# Patient Record
Sex: Female | Born: 1945 | Race: White | Hispanic: No | Marital: Single | Smoking: Current every day smoker
Health system: Southern US, Community
[De-identification: ages and names within clinical notes are randomized; demographics above are authoritative.]

## PROBLEM LIST (undated history)

## (undated) DIAGNOSIS — F329 Major depressive disorder, single episode, unspecified: Secondary | ICD-10-CM

## (undated) DIAGNOSIS — G4730 Sleep apnea, unspecified: Secondary | ICD-10-CM

## (undated) DIAGNOSIS — R188 Other ascites: Secondary | ICD-10-CM

## (undated) DIAGNOSIS — D649 Anemia, unspecified: Secondary | ICD-10-CM

## (undated) DIAGNOSIS — F988 Other specified behavioral and emotional disorders with onset usually occurring in childhood and adolescence: Secondary | ICD-10-CM

## (undated) DIAGNOSIS — F32A Depression, unspecified: Secondary | ICD-10-CM

## (undated) DIAGNOSIS — K7010 Alcoholic hepatitis without ascites: Secondary | ICD-10-CM

## (undated) DIAGNOSIS — R002 Palpitations: Secondary | ICD-10-CM

## (undated) HISTORY — PX: FRACTURE SURGERY: SHX138

## (undated) HISTORY — PX: COLONOSCOPY: SHX174

## (undated) HISTORY — DX: Alcoholic hepatitis without ascites: K70.10

## (undated) HISTORY — DX: Other ascites: R18.8

## (undated) HISTORY — PX: MASTECTOMY: SHX3

## (undated) HISTORY — PX: JOINT REPLACEMENT: SHX530

---

## 1998-09-25 ENCOUNTER — Ambulatory Visit: Admission: RE | Admit: 1998-09-25 | Discharge: 1998-09-25 | Payer: Self-pay | Admitting: Psychiatry

## 1999-05-16 ENCOUNTER — Encounter: Payer: Self-pay | Admitting: Emergency Medicine

## 1999-05-16 ENCOUNTER — Emergency Department (HOSPITAL_COMMUNITY): Admission: EM | Admit: 1999-05-16 | Discharge: 1999-05-16 | Payer: Self-pay | Admitting: Emergency Medicine

## 2000-04-15 ENCOUNTER — Encounter: Payer: Self-pay | Admitting: *Deleted

## 2000-04-15 ENCOUNTER — Encounter: Admission: RE | Admit: 2000-04-15 | Discharge: 2000-04-15 | Payer: Self-pay | Admitting: *Deleted

## 2000-11-11 ENCOUNTER — Encounter: Admission: RE | Admit: 2000-11-11 | Discharge: 2000-11-11 | Payer: Self-pay | Admitting: Internal Medicine

## 2000-11-11 ENCOUNTER — Encounter: Payer: Self-pay | Admitting: Internal Medicine

## 2001-06-09 ENCOUNTER — Ambulatory Visit (HOSPITAL_COMMUNITY): Admission: RE | Admit: 2001-06-09 | Discharge: 2001-06-09 | Payer: Self-pay | Admitting: Chiropractic Medicine

## 2001-06-09 ENCOUNTER — Encounter: Payer: Self-pay | Admitting: Chiropractic Medicine

## 2002-09-26 ENCOUNTER — Encounter: Payer: Self-pay | Admitting: Internal Medicine

## 2002-09-26 ENCOUNTER — Encounter: Admission: RE | Admit: 2002-09-26 | Discharge: 2002-09-26 | Payer: Self-pay | Admitting: Internal Medicine

## 2004-07-25 ENCOUNTER — Ambulatory Visit: Payer: Self-pay | Admitting: Gastroenterology

## 2004-08-08 ENCOUNTER — Ambulatory Visit: Payer: Self-pay | Admitting: Gastroenterology

## 2006-05-07 ENCOUNTER — Encounter: Admission: RE | Admit: 2006-05-07 | Discharge: 2006-05-07 | Payer: Self-pay | Admitting: Internal Medicine

## 2006-05-21 ENCOUNTER — Encounter (INDEPENDENT_AMBULATORY_CARE_PROVIDER_SITE_OTHER): Payer: Self-pay | Admitting: *Deleted

## 2006-05-21 ENCOUNTER — Encounter (INDEPENDENT_AMBULATORY_CARE_PROVIDER_SITE_OTHER): Payer: Self-pay | Admitting: Radiology

## 2006-05-21 ENCOUNTER — Encounter: Admission: RE | Admit: 2006-05-21 | Discharge: 2006-05-21 | Payer: Self-pay | Admitting: Internal Medicine

## 2006-05-28 ENCOUNTER — Ambulatory Visit: Payer: Self-pay | Admitting: Oncology

## 2006-06-01 ENCOUNTER — Encounter: Admission: RE | Admit: 2006-06-01 | Discharge: 2006-06-01 | Payer: Self-pay | Admitting: Internal Medicine

## 2006-06-02 ENCOUNTER — Encounter: Admission: RE | Admit: 2006-06-02 | Discharge: 2006-06-02 | Payer: Self-pay | Admitting: Internal Medicine

## 2006-06-07 ENCOUNTER — Ambulatory Visit (HOSPITAL_BASED_OUTPATIENT_CLINIC_OR_DEPARTMENT_OTHER): Admission: RE | Admit: 2006-06-07 | Discharge: 2006-06-08 | Payer: Self-pay | Admitting: Surgery

## 2006-06-07 ENCOUNTER — Encounter (INDEPENDENT_AMBULATORY_CARE_PROVIDER_SITE_OTHER): Payer: Self-pay | Admitting: Specialist

## 2006-06-09 LAB — CANCER ANTIGEN 27.29: CA 27.29: 26 U/mL (ref 0–39)

## 2006-06-09 LAB — COMPREHENSIVE METABOLIC PANEL
Alkaline Phosphatase: 76 U/L (ref 39–117)
BUN: 18 mg/dL (ref 6–23)
CO2: 26 mEq/L (ref 19–32)
Creatinine, Ser: 0.68 mg/dL (ref 0.40–1.20)
Glucose, Bld: 119 mg/dL — ABNORMAL HIGH (ref 70–99)
Sodium: 142 mEq/L (ref 135–145)
Total Bilirubin: 0.4 mg/dL (ref 0.3–1.2)

## 2006-06-09 LAB — CBC WITH DIFFERENTIAL (CANCER CENTER ONLY)
BASO%: 1.3 % (ref 0.0–2.0)
LYMPH%: 40.6 % (ref 14.0–48.0)
MCV: 99 fL (ref 81–101)
MONO#: 0.6 10*3/uL (ref 0.1–0.9)
MONO%: 5.8 % (ref 0.0–13.0)
Platelets: 232 10*3/uL (ref 145–400)
RDW: 11.1 % (ref 10.5–14.6)
WBC: 11.1 10*3/uL — ABNORMAL HIGH (ref 3.9–10.0)

## 2006-06-09 LAB — LACTATE DEHYDROGENASE: LDH: 175 U/L (ref 94–250)

## 2006-06-11 ENCOUNTER — Encounter: Admission: RE | Admit: 2006-06-11 | Discharge: 2006-06-11 | Payer: Self-pay | Admitting: Oncology

## 2006-06-29 LAB — LIPID PANEL
Cholesterol: 218 mg/dL — ABNORMAL HIGH (ref 0–200)
HDL: 49 mg/dL (ref >39)
LDL Cholesterol: 142 mg/dL — ABNORMAL HIGH (ref 0–99)
Triglycerides: 136 mg/dL (ref <150)

## 2006-08-10 ENCOUNTER — Ambulatory Visit: Payer: Self-pay | Admitting: Oncology

## 2006-08-11 LAB — CBC WITH DIFFERENTIAL (CANCER CENTER ONLY)
BASO%: 0.7 % (ref 0.0–2.0)
HCT: 44.1 % (ref 34.8–46.6)
LYMPH#: 2.4 10*3/uL (ref 0.9–3.3)
MONO#: 0.5 10*3/uL (ref 0.1–0.9)
NEUT#: 3.4 10*3/uL (ref 1.5–6.5)
Platelets: 222 10*3/uL (ref 145–400)
RDW: 10.8 % (ref 10.5–14.6)
WBC: 6.5 10*3/uL (ref 3.9–10.0)

## 2006-08-11 LAB — LIPID PANEL
HDL: 41 mg/dL (ref >39)
LDL Cholesterol: 139 mg/dL — ABNORMAL HIGH (ref 0–99)
Total CHOL/HDL Ratio: 5.7 Ratio

## 2006-11-01 ENCOUNTER — Ambulatory Visit: Payer: Self-pay | Admitting: Oncology

## 2006-11-02 LAB — CBC WITH DIFFERENTIAL (CANCER CENTER ONLY)
BASO#: 0.1 10*3/uL (ref 0.0–0.2)
BASO%: 0.8 % (ref 0.0–2.0)
HCT: 41.6 % (ref 34.8–46.6)
HGB: 14.4 g/dL (ref 11.6–15.9)
LYMPH#: 2.6 10*3/uL (ref 0.9–3.3)
MONO#: 0.4 10*3/uL (ref 0.1–0.9)
NEUT%: 47.3 % (ref 39.6–80.0)
RBC: 4.33 10*6/uL (ref 3.70–5.32)
WBC: 6.1 10*3/uL (ref 3.9–10.0)

## 2006-11-02 LAB — COMPREHENSIVE METABOLIC PANEL
ALT: 52 U/L — ABNORMAL HIGH (ref 0–35)
AST: 35 U/L (ref 0–37)
Albumin: 4.6 g/dL (ref 3.5–5.2)
Alkaline Phosphatase: 87 U/L (ref 39–117)
Potassium: 4.4 mEq/L (ref 3.5–5.3)
Sodium: 137 mEq/L (ref 135–145)
Total Bilirubin: 0.4 mg/dL (ref 0.3–1.2)
Total Protein: 7.4 g/dL (ref 6.0–8.3)

## 2006-11-02 LAB — CANCER ANTIGEN 27.29: CA 27.29: 24 U/mL (ref 0–39)

## 2007-05-03 ENCOUNTER — Ambulatory Visit: Payer: Self-pay | Admitting: Oncology

## 2007-05-04 LAB — COMPREHENSIVE METABOLIC PANEL
ALT: 62 U/L — ABNORMAL HIGH (ref 0–35)
BUN: 15 mg/dL (ref 6–23)
CO2: 24 mEq/L (ref 19–32)
Calcium: 9.4 mg/dL (ref 8.4–10.5)
Chloride: 100 mEq/L (ref 96–112)
Creatinine, Ser: 0.69 mg/dL (ref 0.40–1.20)
Glucose, Bld: 167 mg/dL — ABNORMAL HIGH (ref 70–99)
Total Bilirubin: 0.6 mg/dL (ref 0.3–1.2)

## 2007-05-04 LAB — CBC WITH DIFFERENTIAL (CANCER CENTER ONLY)
BASO#: 0 10*3/uL (ref 0.0–0.2)
EOS%: 2.1 % (ref 0.0–7.0)
Eosinophils Absolute: 0.2 10*3/uL (ref 0.0–0.5)
HCT: 43 % (ref 34.8–46.6)
HGB: 14.7 g/dL (ref 11.6–15.9)
LYMPH#: 2.4 10*3/uL (ref 0.9–3.3)
MCH: 33.5 pg (ref 26.0–34.0)
MCHC: 34.3 g/dL (ref 32.0–36.0)
NEUT%: 59.4 % (ref 39.6–80.0)
RBC: 4.4 10*6/uL (ref 3.70–5.32)

## 2007-05-13 ENCOUNTER — Encounter: Admission: RE | Admit: 2007-05-13 | Discharge: 2007-05-13 | Payer: Self-pay | Admitting: Surgery

## 2007-06-23 ENCOUNTER — Other Ambulatory Visit: Admission: RE | Admit: 2007-06-23 | Discharge: 2007-06-23 | Payer: Self-pay | Admitting: Internal Medicine

## 2007-07-11 ENCOUNTER — Encounter: Admission: RE | Admit: 2007-07-11 | Discharge: 2007-07-11 | Payer: Self-pay | Admitting: Internal Medicine

## 2007-07-22 ENCOUNTER — Ambulatory Visit: Payer: Self-pay | Admitting: Oncology

## 2007-07-27 LAB — IRON AND TIBC
%SAT: 40 % (ref 20–55)
TIBC: 360 ug/dL (ref 250–470)

## 2007-07-27 LAB — CBC WITH DIFFERENTIAL (CANCER CENTER ONLY)
EOS%: 3.2 % (ref 0.0–7.0)
Eosinophils Absolute: 0.3 10*3/uL (ref 0.0–0.5)
MCH: 32.9 pg (ref 26.0–34.0)
MONO%: 5.7 % (ref 0.0–13.0)
NEUT#: 4.6 10*3/uL (ref 1.5–6.5)
Platelets: 218 10*3/uL (ref 145–400)
RBC: 4.62 10*6/uL (ref 3.70–5.32)

## 2007-07-27 LAB — CMP (CANCER CENTER ONLY)
ALT(SGPT): 89 U/L — ABNORMAL HIGH (ref 10–47)
Albumin: 3.8 g/dL (ref 3.3–5.5)
Alkaline Phosphatase: 87 U/L — ABNORMAL HIGH (ref 26–84)
Glucose, Bld: 131 mg/dL — ABNORMAL HIGH (ref 73–118)
Potassium: 3.9 mEq/L (ref 3.3–4.7)
Sodium: 144 mEq/L (ref 128–145)
Total Protein: 7.4 g/dL (ref 6.4–8.1)

## 2007-07-27 LAB — FERRITIN: Ferritin: 653 ng/mL — ABNORMAL HIGH (ref 10–291)

## 2007-07-27 LAB — RETICULOCYTES (CHCC): RBC.: 4.7 MIL/uL (ref 3.87–5.11)

## 2007-08-02 LAB — HEMOCHROMATOSIS DNA-PCR(C282Y,H63D)

## 2007-08-25 LAB — CBC WITH DIFFERENTIAL (CANCER CENTER ONLY)
Eosinophils Absolute: 0.2 10*3/uL (ref 0.0–0.5)
LYMPH%: 39.3 % (ref 14.0–48.0)
MCH: 31.7 pg (ref 26.0–34.0)
MCV: 93 fL (ref 81–101)
MONO%: 4 % (ref 0.0–13.0)
Platelets: 244 10*3/uL (ref 145–400)
RBC: 4.54 10*6/uL (ref 3.70–5.32)
RDW: 10.5 % (ref 10.5–14.6)

## 2007-08-25 LAB — CMP (CANCER CENTER ONLY)
AST: 38 U/L (ref 11–38)
Albumin: 3.4 g/dL (ref 3.3–5.5)
Alkaline Phosphatase: 95 U/L — ABNORMAL HIGH (ref 26–84)
Potassium: 3.7 mEq/L (ref 3.3–4.7)
Sodium: 143 mEq/L (ref 128–145)
Total Protein: 7.3 g/dL (ref 6.4–8.1)

## 2007-08-25 LAB — IRON AND TIBC: TIBC: 348 ug/dL (ref 250–470)

## 2007-08-25 LAB — FERRITIN: Ferritin: 325 ng/mL — ABNORMAL HIGH (ref 10–291)

## 2007-09-21 ENCOUNTER — Ambulatory Visit: Payer: Self-pay | Admitting: Oncology

## 2007-09-22 LAB — COMPREHENSIVE METABOLIC PANEL
ALT: 37 U/L — ABNORMAL HIGH (ref 0–35)
AST: 37 U/L (ref 0–37)
Albumin: 4 g/dL (ref 3.5–5.2)
Alkaline Phosphatase: 98 U/L (ref 39–117)
BUN: 10 mg/dL (ref 6–23)
Chloride: 103 mEq/L (ref 96–112)
Potassium: 3.4 mEq/L — ABNORMAL LOW (ref 3.5–5.3)

## 2007-09-22 LAB — CBC WITH DIFFERENTIAL (CANCER CENTER ONLY)
BASO#: 0 10*3/uL (ref 0.0–0.2)
EOS%: 1.8 % (ref 0.0–7.0)
MCH: 31.9 pg (ref 26.0–34.0)
MCHC: 34.1 g/dL (ref 32.0–36.0)
MONO%: 6.4 % (ref 0.0–13.0)
NEUT#: 3.3 10*3/uL (ref 1.5–6.5)
Platelets: 261 10*3/uL (ref 145–400)
RDW: 10.8 % (ref 10.5–14.6)

## 2007-09-23 LAB — IRON AND TIBC
%SAT: 25 % (ref 20–55)
TIBC: 376 ug/dL (ref 250–470)

## 2008-03-23 ENCOUNTER — Ambulatory Visit: Payer: Self-pay | Admitting: Oncology

## 2008-03-27 LAB — CBC WITH DIFFERENTIAL (CANCER CENTER ONLY)
BASO#: 0.1 10*3/uL (ref 0.0–0.2)
EOS%: 2.2 % (ref 0.0–7.0)
HCT: 48.4 % — ABNORMAL HIGH (ref 34.8–46.6)
HGB: 16.6 g/dL — ABNORMAL HIGH (ref 11.6–15.9)
LYMPH%: 38.2 % (ref 14.0–48.0)
MCH: 34.6 pg — ABNORMAL HIGH (ref 26.0–34.0)
MCHC: 34.2 g/dL (ref 32.0–36.0)
MONO%: 6.7 % (ref 0.0–13.0)
NEUT%: 51.1 % (ref 39.6–80.0)

## 2008-03-27 LAB — CMP (CANCER CENTER ONLY)
ALT(SGPT): 45 U/L (ref 10–47)
AST: 47 U/L — ABNORMAL HIGH (ref 11–38)
Alkaline Phosphatase: 97 U/L — ABNORMAL HIGH (ref 26–84)
BUN, Bld: 12 mg/dL (ref 7–22)
Calcium: 8.9 mg/dL (ref 8.0–10.3)
Creat: 0.2 mg/dl — ABNORMAL LOW (ref 0.6–1.2)
Total Bilirubin: 1 mg/dl (ref 0.20–1.60)

## 2008-03-27 LAB — IRON AND TIBC
%SAT: 42 % (ref 20–55)
TIBC: 406 ug/dL (ref 250–470)

## 2008-03-27 LAB — FERRITIN: Ferritin: 158 ng/mL (ref 10–291)

## 2008-05-17 ENCOUNTER — Encounter: Admission: RE | Admit: 2008-05-17 | Discharge: 2008-05-17 | Payer: Self-pay | Admitting: Internal Medicine

## 2008-05-25 ENCOUNTER — Encounter: Admission: RE | Admit: 2008-05-25 | Discharge: 2008-05-25 | Payer: Self-pay | Admitting: Internal Medicine

## 2008-05-25 ENCOUNTER — Encounter (INDEPENDENT_AMBULATORY_CARE_PROVIDER_SITE_OTHER): Payer: Self-pay | Admitting: Diagnostic Radiology

## 2008-06-01 ENCOUNTER — Encounter: Admission: RE | Admit: 2008-06-01 | Discharge: 2008-06-01 | Payer: Self-pay | Admitting: Internal Medicine

## 2008-06-12 ENCOUNTER — Encounter: Admission: RE | Admit: 2008-06-12 | Discharge: 2008-06-12 | Payer: Self-pay | Admitting: Oncology

## 2008-06-19 ENCOUNTER — Encounter: Admission: RE | Admit: 2008-06-19 | Discharge: 2008-06-19 | Payer: Self-pay | Admitting: Surgery

## 2008-06-21 ENCOUNTER — Ambulatory Visit (HOSPITAL_BASED_OUTPATIENT_CLINIC_OR_DEPARTMENT_OTHER): Admission: RE | Admit: 2008-06-21 | Discharge: 2008-06-22 | Payer: Self-pay | Admitting: Surgery

## 2008-06-21 ENCOUNTER — Encounter (INDEPENDENT_AMBULATORY_CARE_PROVIDER_SITE_OTHER): Payer: Self-pay | Admitting: Surgery

## 2008-09-24 ENCOUNTER — Ambulatory Visit: Payer: Self-pay | Admitting: Oncology

## 2008-09-26 LAB — CMP (CANCER CENTER ONLY)
ALT(SGPT): 112 U/L — ABNORMAL HIGH (ref 10–47)
BUN, Bld: 12 mg/dL (ref 7–22)
CO2: 28 mEq/L (ref 18–33)
Calcium: 9.5 mg/dL (ref 8.0–10.3)
Chloride: 102 mEq/L (ref 98–108)
Creat: 0.6 mg/dl (ref 0.6–1.2)
Total Bilirubin: 0.7 mg/dl (ref 0.20–1.60)

## 2008-09-26 LAB — IRON AND TIBC
%SAT: 29 % (ref 20–55)
Iron: 113 ug/dL (ref 42–145)
TIBC: 396 ug/dL (ref 250–470)
UIBC: 283 ug/dL

## 2008-09-26 LAB — CBC WITH DIFFERENTIAL (CANCER CENTER ONLY)
BASO#: 0.1 10*3/uL (ref 0.0–0.2)
HCT: 46.5 % (ref 34.8–46.6)
HGB: 16.3 g/dL — ABNORMAL HIGH (ref 11.6–15.9)
LYMPH#: 2 10*3/uL (ref 0.9–3.3)
MCHC: 35.1 g/dL (ref 32.0–36.0)
MCV: 97 fL (ref 81–101)
MONO#: 0.5 10*3/uL (ref 0.1–0.9)
NEUT%: 47.2 % (ref 39.6–80.0)
WBC: 5.3 10*3/uL (ref 3.9–10.0)

## 2008-09-26 LAB — FERRITIN: Ferritin: 554 ng/mL — ABNORMAL HIGH (ref 10–291)

## 2009-02-14 ENCOUNTER — Ambulatory Visit: Payer: Self-pay | Admitting: Internal Medicine

## 2009-04-02 ENCOUNTER — Ambulatory Visit: Payer: Self-pay | Admitting: Oncology

## 2009-04-03 LAB — IRON AND TIBC
%SAT: 34 % (ref 20–55)
Iron: 142 ug/dL (ref 42–145)

## 2009-04-03 LAB — CBC WITH DIFFERENTIAL (CANCER CENTER ONLY)
BASO%: 1.3 % (ref 0.0–2.0)
Eosinophils Absolute: 0.2 10*3/uL (ref 0.0–0.5)
HCT: 48.4 % — ABNORMAL HIGH (ref 34.8–46.6)
LYMPH#: 2.3 10*3/uL (ref 0.9–3.3)
MONO#: 0.4 10*3/uL (ref 0.1–0.9)
NEUT%: 52.8 % (ref 39.6–80.0)
RBC: 4.74 10*6/uL (ref 3.70–5.32)
RDW: 12 % (ref 10.5–14.6)
WBC: 6.3 10*3/uL (ref 3.9–10.0)

## 2009-04-03 LAB — FERRITIN: Ferritin: 508 ng/mL — ABNORMAL HIGH (ref 10–291)

## 2009-04-03 LAB — CMP (CANCER CENTER ONLY)
ALT(SGPT): 93 U/L — ABNORMAL HIGH (ref 10–47)
CO2: 29 mEq/L (ref 18–33)
Calcium: 10.2 mg/dL (ref 8.0–10.3)
Chloride: 99 mEq/L (ref 98–108)
Creat: 0.6 mg/dl (ref 0.6–1.2)
Glucose, Bld: 94 mg/dL (ref 73–118)
Sodium: 144 mEq/L (ref 128–145)
Total Bilirubin: 0.9 mg/dl (ref 0.20–1.60)
Total Protein: 7.5 g/dL (ref 6.4–8.1)

## 2009-05-04 ENCOUNTER — Emergency Department (HOSPITAL_COMMUNITY): Admission: EM | Admit: 2009-05-04 | Discharge: 2009-05-04 | Payer: Self-pay | Admitting: Emergency Medicine

## 2009-06-21 ENCOUNTER — Encounter (INDEPENDENT_AMBULATORY_CARE_PROVIDER_SITE_OTHER): Payer: Self-pay | Admitting: *Deleted

## 2009-07-17 IMAGING — CR DG CHEST 2V
2 series · 2 of 2 positions shown · non-contrast
Comparison: [HOSPITAL] at [REDACTED] [HOSPITAL] chest x-ray
06/02/2006.

CLINICAL DATA: Preop respiratory exam.  Breast cancer.  Smoker.
Hypertension.

CHEST - 2 VIEW

[view not recorded (1 of 2)]
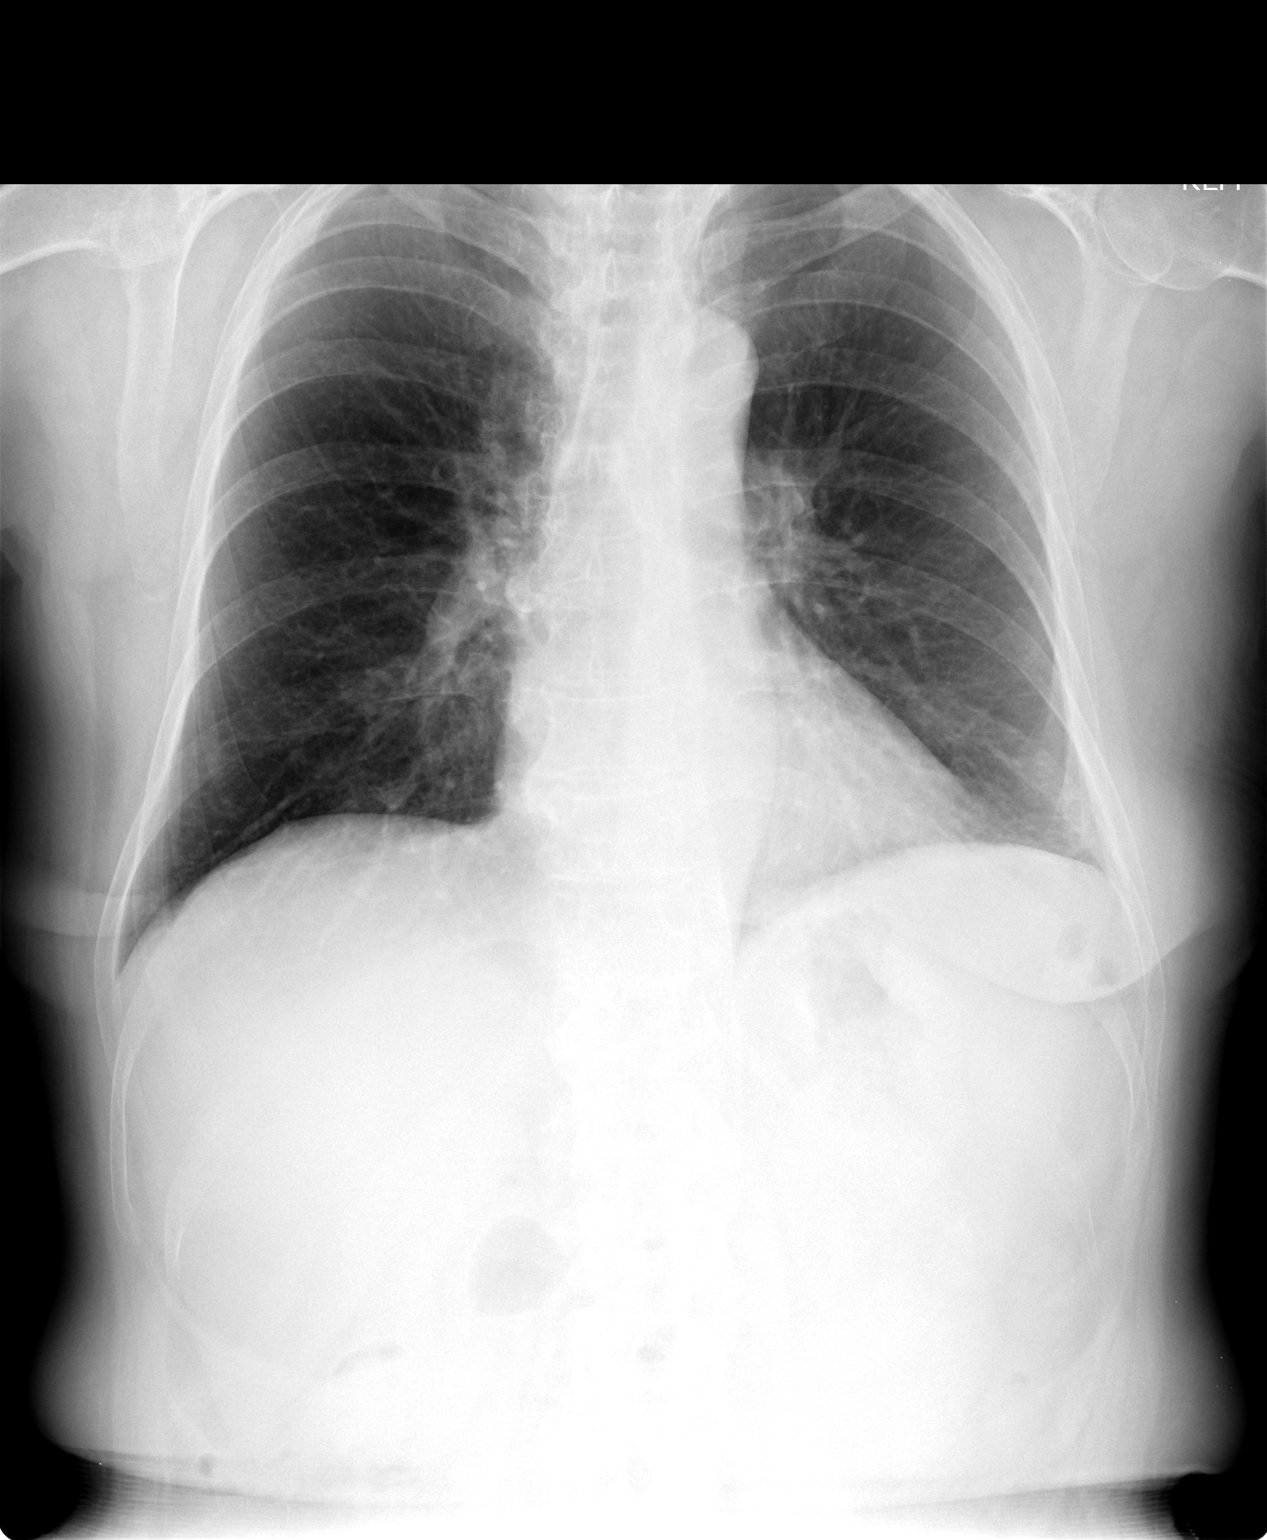

[view not recorded (2 of 2)]
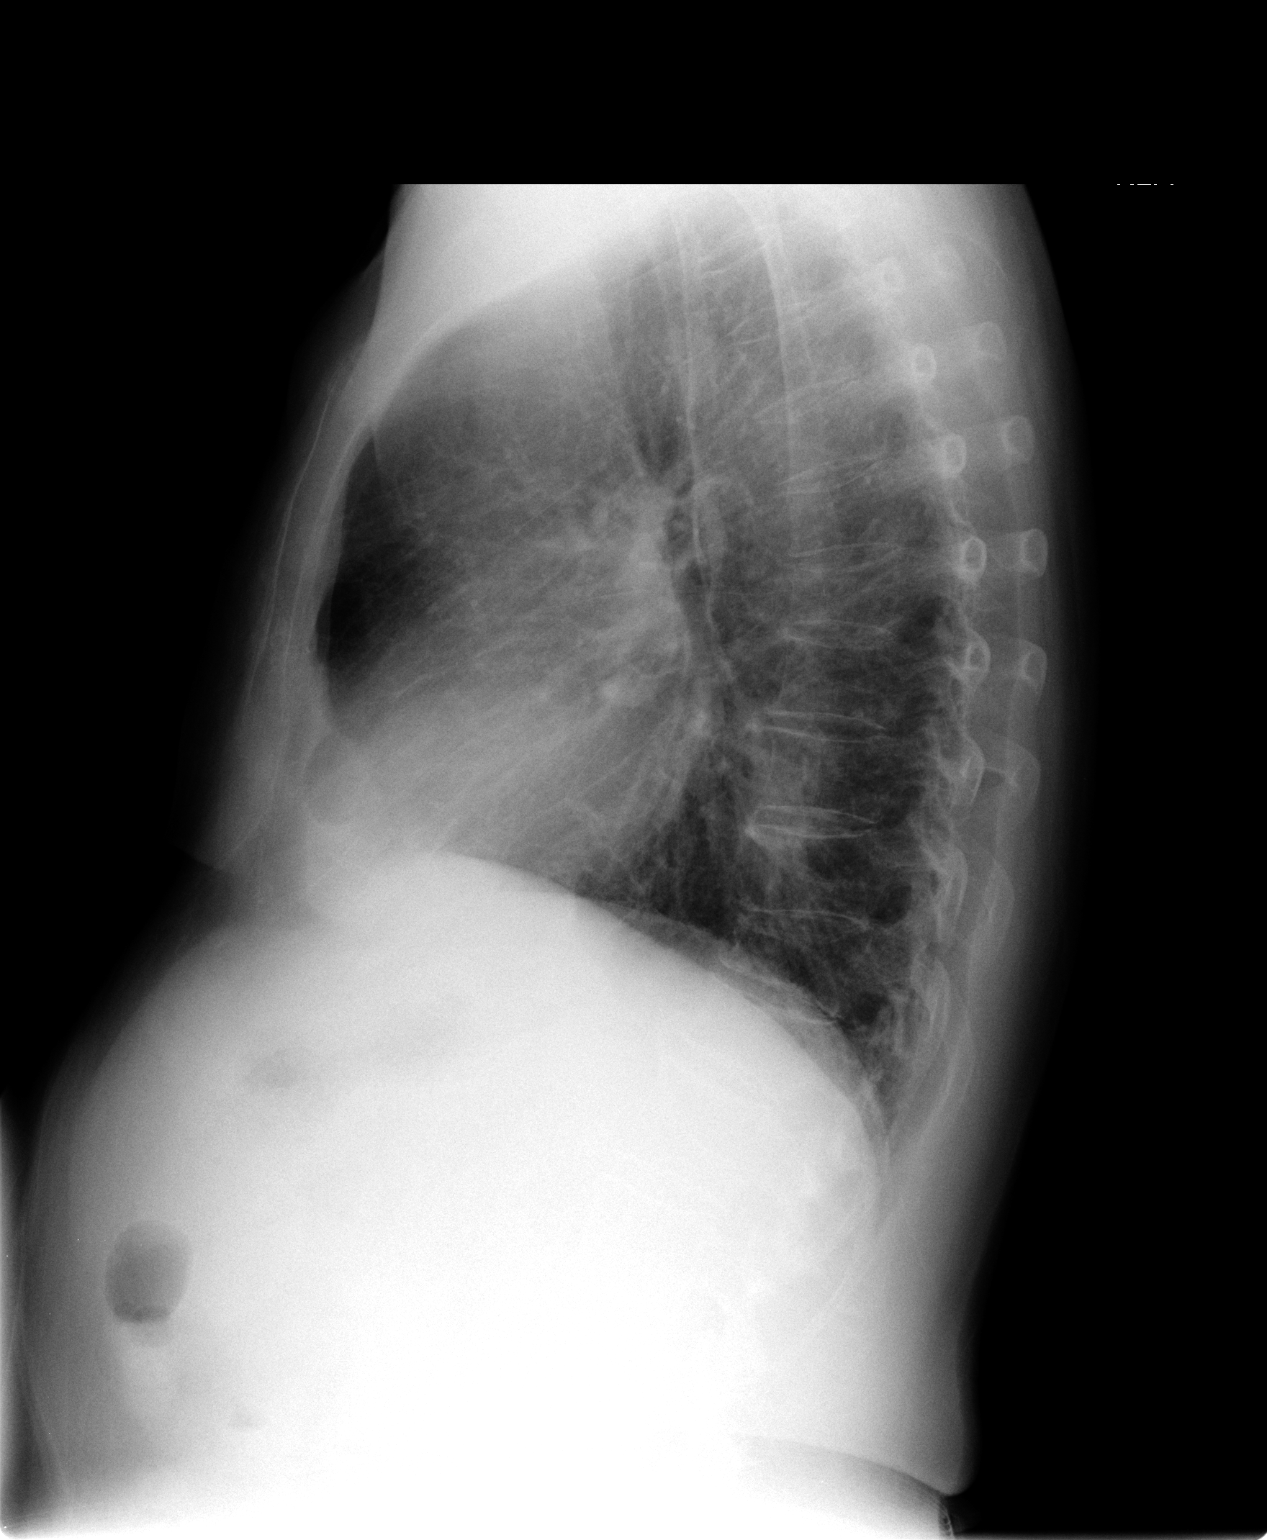

[2 of 2 positions shown; findings below may reference images not displayed]

FINDINGS: Interval right mastectomy noted.  Lungs are clear.  Heart
size/configuration remain normal.  Thoracic aortic arch
calcification unchanged.  Mediastinum, hila, pleura appear normal.
Dorsal osteopenia with stable slight compression fracture inferior
thoracic spine noted.  Unchanged dorsal scoliosis and slight
degenerative changes.
IMPRESSION: 1.  Interval right mastectomy.
2.  No acute or metastatic disease.
3.  Stable dorsal osteopenia and compression fracture inferior
dorsal spine.

REF:G3 DICTATED: 06/19/2008 [DATE]

## 2009-08-23 ENCOUNTER — Encounter (INDEPENDENT_AMBULATORY_CARE_PROVIDER_SITE_OTHER): Payer: Self-pay | Admitting: *Deleted

## 2009-08-28 ENCOUNTER — Ambulatory Visit: Payer: Self-pay | Admitting: Gastroenterology

## 2009-09-11 ENCOUNTER — Ambulatory Visit: Payer: Self-pay | Admitting: Gastroenterology

## 2009-09-23 ENCOUNTER — Ambulatory Visit: Payer: Self-pay | Admitting: Oncology

## 2009-09-24 ENCOUNTER — Ambulatory Visit: Payer: Self-pay | Admitting: Internal Medicine

## 2009-11-25 ENCOUNTER — Other Ambulatory Visit: Admission: RE | Admit: 2009-11-25 | Discharge: 2009-11-25 | Payer: Self-pay | Admitting: Internal Medicine

## 2009-11-25 ENCOUNTER — Ambulatory Visit: Payer: Self-pay | Admitting: Internal Medicine

## 2009-11-27 ENCOUNTER — Ambulatory Visit: Payer: Self-pay | Admitting: Oncology

## 2009-12-03 LAB — CBC WITH DIFFERENTIAL (CANCER CENTER ONLY)
BASO#: 0.1 10*3/uL (ref 0.0–0.2)
EOS%: 2.9 % (ref 0.0–7.0)
HCT: 46.6 % (ref 34.8–46.6)
HGB: 15.9 g/dL (ref 11.6–15.9)
LYMPH#: 1.8 10*3/uL (ref 0.9–3.3)
MCH: 35.6 pg — ABNORMAL HIGH (ref 26.0–34.0)
MCHC: 34.2 g/dL (ref 32.0–36.0)
NEUT%: 65.3 % (ref 39.6–80.0)

## 2009-12-03 LAB — CMP (CANCER CENTER ONLY)
AST: 111 U/L — ABNORMAL HIGH (ref 11–38)
BUN, Bld: 12 mg/dL (ref 7–22)
Calcium: 9.8 mg/dL (ref 8.0–10.3)
Chloride: 100 mEq/L (ref 98–108)
Creat: 0.6 mg/dl (ref 0.6–1.2)
Glucose, Bld: 127 mg/dL — ABNORMAL HIGH (ref 73–118)

## 2009-12-04 LAB — FOLATE: Folate: 7.3 ng/mL

## 2009-12-04 LAB — IRON AND TIBC
%SAT: 24 % (ref 20–55)
Iron: 96 ug/dL (ref 42–145)
TIBC: 398 ug/dL (ref 250–470)
UIBC: 302 ug/dL

## 2010-03-03 ENCOUNTER — Inpatient Hospital Stay (HOSPITAL_COMMUNITY): Admission: RE | Admit: 2010-03-03 | Discharge: 2010-03-05 | Payer: Self-pay | Admitting: Orthopedic Surgery

## 2010-06-17 NOTE — Letter (Signed)
Summary: [facility] Instructions  [facility]  [address] [place], [place] [address]   Phone: [phone]  Fax: [phone]       Teresa Schneider    1945/09/06    MRN: [id number]        Procedure Day Dorna Bloom:  [facility]  09/11/09     Arrival Time:  9:30AM     Procedure Time:  10:30AM     Location of Procedure:                    _ X_  [facility] (4th Floor)                        PREPARATION FOR COLONOSCOPY WITH MOVIPREP   Starting 5 days prior to your procedure 09/06/09 do not eat nuts, seeds, popcorn, corn, beans, peas,  salads, or any raw vegetables.  Do not take any fiber supplements (e.g. Metamucil, Citrucel, and Benefiber).  THE DAY BEFORE YOUR PROCEDURE         DATE: 09/10/09  DAY: TUESDAY  1.  Drink clear liquids the entire day-NO SOLID FOOD  2.  Do not drink anything colored red or purple.  Avoid juices with pulp.  No orange juice.  3.  Drink at least 64 oz. (8 glasses) of fluid/clear liquids during the day to prevent dehydration and help the prep work efficiently.  CLEAR LIQUIDS INCLUDE: Water Jello Ice Popsicles Tea (sugar ok, no milk/cream) Powdered fruit flavored drinks Coffee (sugar ok, no milk/cream) Gatorade Juice: apple, white grape, white cranberry  Lemonade Clear bullion, consomm, broth Carbonated beverages (any kind) Strained chicken noodle soup Hard Candy                             4.  In the morning, mix first dose of MoviPrep solution:    Empty 1 Pouch A and 1 Pouch B into the disposable container    Add lukewarm drinking water to the top line of the container. Mix to dissolve    Refrigerate (mixed solution should be used within 24 hrs)  5.  Begin drinking the prep at 5:00 p.m. The MoviPrep container is divided by 4 marks.   Every 15 minutes drink the solution down to the next mark (approximately 8 oz) until the full liter is complete.   6.  Follow completed prep with 16 oz of clear liquid of your choice  (Nothing red or purple).  Continue to drink clear liquids until bedtime.  7.  Before going to bed, mix second dose of MoviPrep solution:    Empty 1 Pouch A and 1 Pouch B into the disposable container    Add lukewarm drinking water to the top line of the container. Mix to dissolve    Refrigerate  THE DAY OF YOUR PROCEDURE      DATE: 09/11/09  DAY: WEDNESDAY  Beginning at 5:30AM (5 hours before procedure):         1. Every 15 minutes, drink the solution down to the next mark (approx 8 oz) until the full liter is complete.  2. Follow completed prep with 16 oz. of clear liquid of your choice.    3. You may drink clear liquids until 8:30AM (2 HOURS BEFORE PROCEDURE).   MEDICATION INSTRUCTIONS  Unless otherwise instructed, you should take regular prescription medications with a small sip of water   as early as possible the morning  of your procedure.          OTHER INSTRUCTIONS  You will need a responsible adult at least 63 years of age to accompany you and drive you home.   This person must remain in the waiting room during your procedure.  Wear loose fitting clothing that is easily removed.  Leave jewelry and other valuables at home.  However, you may wish to bring a book to read or  an iPod/MP3 player to listen to music as you wait for your procedure to start.  Remove all body piercing jewelry and leave at home.  Total time from sign-in until discharge is approximately 2-3 hours.  You should go home directly after your procedure and rest.  You can resume normal activities the  day after your procedure.  The day of your procedure you should not:   Drive   Make legal decisions   Operate machinery   Drink alcohol   Return to work  You will receive specific instructions about eating, activities and medications before you leave.    The above instructions have been reviewed and explained to me by  Wyona Almas RN  August 28, 2009 9:15 AM     I fully  understand and can verbalize these instructions _____________________________ Date _________

## 2010-06-17 NOTE — Letter (Signed)
Summary: Colonoscopy Letter  [facility]  [address] [place], [place] [address]   Phone: [phone]  Fax: [phone]      June 21, 2009 MRN: [id number]   [facility] [address] [place], [place]  [address]   Dear Ms. Spraker,   According to your medical record, it is time for you to schedule a Colonoscopy. The American Cancer Society recommends this procedure as a method to detect early colon cancer. Patients with a family history of colon cancer, or a personal history of colon polyps or inflammatory bowel disease are at increased risk.  This letter has beeen generated based on the recommendations made at the time of your procedure. If you feel that in your particular situation this may no longer apply, please contact our office.  Please call our office at [phone] to schedule this appointment or to update your records at your earliest convenience.  Thank you for cooperating with [place] to provide you with the very best care possible.   Sincerely,  Vania Rea. Jarold Motto, M.D.  [facility]astro[facility] [phone]

## 2010-06-17 NOTE — Procedures (Signed)
Summary: Colonoscopy  Patient: [name] Note: All result statuses are Final unless otherwise noted.  Tests: (1) Colonoscopy (COL)   COL Colonoscopy           DONE     [facility]     [address]     [place], [place]  [address]           COLONOSCOPY PROCEDURE REPORT           PATIENT:  Teresa Schneider, Teresa Schneider  MR#:  [id number]     BIRTHDATE:  20-May-1945, 64 yrs. old  GENDER:  female     ENDOSCOPIST:  Vania Rea. Jarold Motto, MD, [facility]     REF. BY:     PROCEDURE DATE:  09/11/2009     PROCEDURE:  Higher-risk screening colonoscopy G0105           ASA CLASS:  Class II     INDICATIONS:  Elevated Risk Screening, history of polyps     MEDICATIONS:   Fentanyl 75 mcg IV, Versed 8 mg IV           DESCRIPTION OF PROCEDURE:   After the risks benefits and     alternatives of the procedure were thoroughly explained, informed     consent was obtained.  Digital rectal exam was performed and     revealed no abnormalities, perianal skin tags and perianal     dermatitis.   The LB PCF-H180AL [id number] endoscope was introduced     through the anus and advanced to the cecum, which was identified     by both the appendix and ileocecal valve, limited by a redundant     colon.    The quality of the prep was excellent, using MoviPrep.     The instrument was then slowly withdrawn as the colon was fully     examined.     <<PROCEDUREIMAGES>>           FINDINGS:  Mild diverticulosis was found in the sigmoid to     descending colon segments.  No polyps or cancers were seen.  This     was otherwise a normal examination of the colon.   Retroflexed     views in the rectum revealed no abnormalities.    The scope was     then withdrawn from the patient and the procedure completed.           COMPLICATIONS:  None     ENDOSCOPIC IMPRESSION:     1) Mild diverticulosis in the sigmoid to descending colon     segments     2) No polyps or cancers     3) Otherwise normal examination     RECOMMENDATIONS:  1) high fiber diet     2) Continue current colorectal screening recommendations for     "routine risk" patients with a repeat colonoscopy in 10 years.     REPEAT EXAM:  No           ______________________________     Vania Rea. Jarold Motto, MD, [facility]           CC:           n.     eSIGNED:   Vania Rea. Osborn Pullin at 09/11/2009 11:04 AM           Raylene Miyamoto, [id number]  Note: An exclamation mark (!) indicates a result that was not dispersed into the flowsheet. Document Creation Date: 09/11/2009 11:05 AM _______________________________________________________________________  (1) Order result status: Final  Collection or observation date-time: 09/11/2009 11:01 Requested date-time:  Receipt date-time:  Reported date-time:  Referring Physician:   Ordering Physician: Sheryn Bison [phone]) Specimen Source:  Source: Launa Grill Order Number: [id number] Lab site:   Appended Document: Colonoscopy    Clinical Lists Changes  Observations: Added new observation of COLONNXTDUE: 08/2019 (09/11/2009 11:18)      Appended Document: Colonoscopy TO DR. MARY JOHN BAXLEY.....?? ENT EXAM PER HER HOARSENESS AND SMOKING HX...COLON REPORT TO FOLLOW.Marland KitchenMarland KitchenNO POLYPS NOTED...DRP

## 2010-06-17 NOTE — Miscellaneous (Signed)
Summary: [facility] Previsit/prep  Clinical Lists Changes  Medications: Added new medication of MOVIPREP 100 GM  SOLR (PEG-KCL-NACL-NASULF-NA ASC-C) As per prep instructions. - Signed Rx of MOVIPREP 100 GM  SOLR (PEG-KCL-NACL-NASULF-NA ASC-C) As per prep instructions.;  #1 x 0;  Signed;  Entered by: Wyona Almas RN;  Authorized by: Mardella Layman MD [facility];  Method used: Electronically to [facility] [address] [place], [place]  [id number], Ph: [phone] or [phone], Fax: [phone] Allergies: Added new allergy or adverse reaction of SEPTRA Observations: Added new observation of NKA: F (08/28/2009 8:46)    Prescriptions: MOVIPREP 100 GM  SOLR (PEG-KCL-NACL-NASULF-NA ASC-C) As per prep instructions.  #1 x 0   Entered by:   Wyona Almas RN   Authorized by:   Mardella Layman MD [facility]   Signed by:   Wyona Almas RN on 08/28/2009   Method used:   Electronically to        [facility] (retail)       [address]       [place], [place]  [id number]       Ph: [phone] or [phone]       Fax: [phone]   RxID:   [id number]

## 2010-06-26 ENCOUNTER — Ambulatory Visit (INDEPENDENT_AMBULATORY_CARE_PROVIDER_SITE_OTHER): Payer: Self-pay | Admitting: Internal Medicine

## 2010-06-26 DIAGNOSIS — N390 Urinary tract infection, site not specified: Secondary | ICD-10-CM

## 2010-07-30 LAB — BASIC METABOLIC PANEL
CO2: 29 mEq/L (ref 19–32)
Chloride: 103 mEq/L (ref 96–112)
GFR calc non Af Amer: >60 mL/min (ref >60)
Glucose, Bld: 108 mg/dL — ABNORMAL HIGH (ref 70–99)
Glucose, Bld: 225 mg/dL — ABNORMAL HIGH (ref 70–99)
Potassium: 4.1 mEq/L (ref 3.5–5.1)
Potassium: 4.2 mEq/L (ref 3.5–5.1)
Sodium: 136 mEq/L (ref 135–145)
Sodium: 140 mEq/L (ref 135–145)

## 2010-07-30 LAB — CBC
HCT: 31.1 % — ABNORMAL LOW (ref 36.0–46.0)
HCT: 36.7 % (ref 36.0–46.0)
Hemoglobin: 10.7 g/dL — ABNORMAL LOW (ref 12.0–15.0)
Hemoglobin: 12.7 g/dL (ref 12.0–15.0)
MCH: 34.9 pg — ABNORMAL HIGH (ref 26.0–34.0)
MCHC: 34.5 g/dL (ref 30.0–36.0)
MCHC: 34.6 g/dL (ref 30.0–36.0)
WBC: 13.4 10*3/uL — ABNORMAL HIGH (ref 4.0–10.5)

## 2010-07-30 LAB — ABO/RH: ABO/RH(D): O POS

## 2010-07-31 LAB — APTT: aPTT: 26 seconds (ref 24–37)

## 2010-07-31 LAB — URINALYSIS, ROUTINE W REFLEX MICROSCOPIC
Nitrite: NEGATIVE
Protein, ur: NEGATIVE mg/dL
Specific Gravity, Urine: 1.023 (ref 1.005–1.030)
Urobilinogen, UA: 1 mg/dL (ref 0.0–1.0)

## 2010-07-31 LAB — PROTIME-INR: INR: 0.96 (ref 0.00–1.49)

## 2010-07-31 LAB — CBC
MCH: 34.6 pg — ABNORMAL HIGH (ref 26.0–34.0)
MCV: 101 fL — ABNORMAL HIGH (ref 78.0–100.0)
Platelets: 191 10*3/uL (ref 150–400)
RDW: 14 % (ref 11.5–15.5)

## 2010-07-31 LAB — COMPREHENSIVE METABOLIC PANEL
Albumin: 3.6 g/dL (ref 3.5–5.2)
BUN: 7 mg/dL (ref 6–23)
Creatinine, Ser: 0.53 mg/dL (ref 0.4–1.2)
Total Bilirubin: 1.1 mg/dL (ref 0.3–1.2)
Total Protein: 6.7 g/dL (ref 6.0–8.3)

## 2010-07-31 LAB — URINE MICROSCOPIC-ADD ON

## 2010-07-31 LAB — SURGICAL PCR SCREEN: Staphylococcus aureus: NEGATIVE

## 2010-09-02 LAB — CBC
Hemoglobin: 14.4 g/dL (ref 12.0–15.0)
MCHC: 34 g/dL (ref 30.0–36.0)
RBC: 4.3 MIL/uL (ref 3.87–5.11)
WBC: 7.6 10*3/uL (ref 4.0–10.5)

## 2010-09-02 LAB — URINALYSIS, ROUTINE W REFLEX MICROSCOPIC
Ketones, ur: NEGATIVE mg/dL
Protein, ur: NEGATIVE mg/dL
Urobilinogen, UA: 0.2 mg/dL (ref 0.0–1.0)

## 2010-09-02 LAB — COMPREHENSIVE METABOLIC PANEL
ALT: 63 U/L — ABNORMAL HIGH (ref 0–35)
Alkaline Phosphatase: 99 U/L (ref 39–117)
CO2: 24 mEq/L (ref 19–32)
Chloride: 106 mEq/L (ref 96–112)
GFR calc non Af Amer: >60 mL/min (ref >60)
Glucose, Bld: 107 mg/dL — ABNORMAL HIGH (ref 70–99)
Potassium: 3.6 mEq/L (ref 3.5–5.1)
Sodium: 141 mEq/L (ref 135–145)
Total Bilirubin: 1.2 mg/dL (ref 0.3–1.2)

## 2010-09-02 LAB — DIFFERENTIAL
Basophils Relative: 0 % (ref 0–1)
Eosinophils Absolute: 0.1 10*3/uL (ref 0.0–0.7)
Eosinophils Relative: 2 % (ref 0–5)
Neutrophils Relative %: 58 % (ref 43–77)

## 2010-09-02 LAB — URINE MICROSCOPIC-ADD ON

## 2010-09-30 NOTE — Op Note (Signed)
NAMEMORNINGSTAR, TOFT            ACCOUNT NO.:  [id number]   MEDICAL RECORD NO.:  [id number]          PATIENT TYPE:  AMB   LOCATION:  DSC                          FACILITY:  [facility]   PHYSICIAN:  Currie Paris, M.D.DATE OF BIRTH:  02/14/1946   DATE OF PROCEDURE:  06/21/2008  DATE OF DISCHARGE:                               OPERATIVE REPORT   PREOPERATIVE DIAGNOSIS:  Ductal carcinoma in situ, left breast.   POSTOPERATIVE DIAGNOSIS:  Ductal carcinoma in situ, left breast.   OPERATION:  Left total mastectomy with blue dye injection and axillary  sentinel lymph node biopsy.   SURGEON:  Currie Paris, MD   ANESTHESIA:  General.   CLINICAL HISTORY:  This is a 65 year old lady who has had a prior right  mastectomy for breast cancer who has now presented with DCIS in the left  breast.  She has had previous implant in the left breast which had been  removed.  After discussion of alternatives with the patient, she elected  to proceed to a mastectomy.  We plan to do a sentinel node biopsy in  case invasive disease was found on permanent.   DESCRIPTION OF PROCEDURE:  The patient was seen in the holding area, and  she had no further questions.  We went ahead and marked the left breast  as the operative side, although she was status post right mastectomy.  Her radioisotope had already been injected.  She had no further  questions.   The patient was taken to the operating room and after satisfactory  general anesthesia had been obtained, the nipple-areolar area was  prepped and the time-out done.  I injected 5 mL of dilute methylene blue  and massaged that in.   Entire breast was then prepped and draped.  I marked the inframammary  fold, latissimus, and approximate area of the breast and then made an  elliptical incision to include the old biopsy site.  I raised skin flaps  in the usual fashion medially to the sternum and superiorly to the  clavicle and then out into the  axilla.  Using the Neoprobe, I was able  to do some gentle dissection and found a bright blue lymph node almost  immediately.  This was removed and had counts of about 380.   As soon as this was removed, the counts dropped down to background of 0-  5 and there was no other hot areas, no palpably abnormal nodes, and no  evidence of any blue lymphatics.   At this point, I went ahead and made the inferior flap going to  inframammary fold and laterally to the latissimus.  The breast was then  removed from medial to lateral using coagulation current of the cautery.  In the lower outer quadrant area, there was a lot of scarring and I  think there had been some disruption of the pectoralis muscle from her  prior implant and implant removal.  However, all this came off intact.  As we were entering the area of the clavipectoral fascia, Dr. Delila Spence  reported that the sentinel node was negative.   I therefore completed  the mastectomy by dividing the remaining  attachment towards latissimus and off the serratus and then into the  axilla, staying out of the axilla proper.  Bleeders were coagulated or  tied with 3-0 Vicryl suture ligatures.   I spent several minutes irrigating and making sure everything was dry.  I then placed a 19 Blake drain and I laid so that it went towards the  axilla and then up onto the anterior chest wall.   A final check was then made for hemostasis and again everything remained  dry.  The skin was closed with staples.  The drain was charged.  Sterile  dressings were applied.  The patient tolerated the procedure well.  There were no operative complications.  All counts were correct.      Currie Paris, M.D.  Electronically Signed     CJS/MEDQ  D:  06/21/2008  T:  06/21/2008  Job:  [id number]   cc:   Luanna Cole. Lenord Fellers, M.D.  Drue Second, MD

## 2010-10-03 NOTE — Op Note (Signed)
NAMECRYSTALMARIE, YASIN            ACCOUNT NO.:  [id number]   MEDICAL RECORD NO.:  [id number]          PATIENT TYPE:  AMB   LOCATION:  DSC                          FACILITY:  [facility]   PHYSICIAN:  Currie Paris, M.D.DATE OF BIRTH:  08-30-1945   DATE OF PROCEDURE:  06/07/2006  DATE OF DISCHARGE:                               OPERATIVE REPORT   OFFICE MEDICAL RECORD NUMBER [id number].   PREOPERATIVE DIAGNOSIS:  Ductal carcinoma in situ, right breast.   POSTOPERATIVE DIAGNOSIS:  Ductal carcinoma in situ, right breast, plus  silicone implant rupture.   OPERATION:  Right total mastectomy with blue-dye injection, sentinel  lymph node biopsy (1 node removed), removal of silicone implant).   SURGEON:  Dr. Jamey Ripa.   ANESTHESIA:  General.   CLINICAL HISTORY:  Teresa Schneider is a 61-year lady recently found to have  DCIS.  There was a fairly large area of involvement measuring 5 x 5 cm.  This all appeared to be DCIS, and that was what was seen on the core  biopsy.  MRI showed no other evidence of disease.   DESCRIPTION OF PROCEDURE:  The patient was seen in the holding area and  had no further questions.  We confirmed that the right breast was the  operative side.  She already been injected with a radioisotope.  I  initialed the right side.  We discussed we were planning to try to leave  her implant in and do a mastectomy superficial to that.   DESCRIPTION OF PROCEDURE:  The patient was taken to the operating room.  After satisfactory general (LMA) anesthesia had been obtained, I prepped  the area around the nipple, and a time-out occurred.  I injected 5 mL of  dilute methylene blue circumareolarly and massaged that in.   The entire right breast was then prepped and draped.   I outlined an elliptical incision to make.  I used a Neoprobe and  identified a hot area in the axilla and marked the overlying skin.   I made the incision and then raised the skin flaps superiorly to the  clavicle,  medially to the sternum and laterally out into the axilla.  When I got to the axilla, I used a Neoprobe and was able to follow a  blue lymphatic and identify a single hot blue node which had counts of  about 550.  This was removed.  There were no other hot nodes, no other  blue nodes and no palpably abnormal nodes.  This was sent as the  sentinel node.   The inferior flap was raised, and the implant was protruding out under  the pectoralis and really splitting the inferior fibers.  We got down to  the inframammary fold laterally out into the edge of the latissimus.   I began starting medially to take the breast off of the pectoralis and  started superiorly and worked from superior medial to inferior and a  little bit laterally.  However, just with traction on the breast and the  implant, I noticed a leakage of silicone medially where there was no  overlying muscle over the implant,  and there was really no significant  capsular formation around the implant.  The outer layer of the implant  appeared to be fused with the muscle and subcu.  With that finding, I  thought we ought to go ahead and take out the entire implant.  I was  able to elevate the pectoralis muscle off of the implant, but again,  there was really no plane of dissection, and we entered the implant  several more times just with this gentle traction.  This appeared to be  a single wall implant, and it was quite fused to the tissues.  Once I  got above it,  I was able to sweep down from superior off of the chest  wall and off of the rib cage.  There was a several centimeters square  segment that was fused to the ribs medially that I left behind initially  until I got the entire implant out.  Because there had been a fair  amount of spillage of silicone, we spent several minutes drying this up  using dry laps and wiping it off and trying to irrigate it and make sure  everything was as dry and silicone free as possible.  I  changed gloves  several times during this as the gloves kept getting silicone on them.   Once that it was all done, I went back and using cautery elevated the  small fragment of what appeared to be the posterior wall of the implant  off of the ribs.  Again, there was really no plane of separation; this  had to be done all with cautery, but I got this all out intact.   I irrigated and spent several minutes making sure everything was  completely dry.  Because this had protruded through the very thin  fascial edge of the pectoralis major and the pectoralis major had  retracted up and once I had everything cleaned out using all new gloves,  I took 2-0 Vicryl and reattached the lower inner portion of the  pectoralis major muscle towards the area the sternum and then the  inferior edge posteriorly inferiorly down towards the inframammary fold  and then out laterally.  This went very nicely, and I thought we had  good reconstruction of where the pectoralis major should be on top of  the minor.   We irrigated again.  I put two 19 Blake drains in, one on top of the  pectoralis major, one under it and on top of the pectoralis minor.  The  incision was closed with some 2-0 Vicryl subcu and then some staples on  the skin.  The patient tolerated the procedure well.  There were no  complications other than the implant rupture.  Estimated blood loss was  100 mL.  All counts were correct.  At the end of closing, I placed 30 mL  of 0.25% Marcaine subcutaneously in the flaps, let it sit for few  minutes prior to charging the JPs.      Currie Paris, M.D.  Electronically Signed     CJS/MEDQ  D:  06/07/2006  T:  06/07/2006  Job:  [id number]   cc:   Luanna Cole. Lenord Fellers, M.D.

## 2011-08-24 ENCOUNTER — Telehealth: Payer: Self-pay | Admitting: Gastroenterology

## 2011-08-25 NOTE — Telephone Encounter (Addendum)
lmom for pt to call back. COLON 09/11/09 showing mild diverticulosis only.

## 2011-08-27 ENCOUNTER — Other Ambulatory Visit: Payer: Self-pay | Admitting: Gastroenterology

## 2011-08-27 ENCOUNTER — Encounter: Payer: Self-pay | Admitting: *Deleted

## 2011-08-27 DIAGNOSIS — R634 Abnormal weight loss: Secondary | ICD-10-CM

## 2011-08-27 DIAGNOSIS — R110 Nausea: Secondary | ICD-10-CM

## 2011-08-27 NOTE — Progress Notes (Signed)
Patient transferred to Dr Matthias Hughs, records release received

## 2011-08-28 ENCOUNTER — Ambulatory Visit
Admission: RE | Admit: 2011-08-28 | Discharge: 2011-08-28 | Disposition: A | Discharge status: Home / Self Care | Payer: PRIVATE HEALTH INSURANCE | Source: Ambulatory Visit | Attending: Gastroenterology | Admitting: Gastroenterology

## 2011-08-28 DIAGNOSIS — R634 Abnormal weight loss: Secondary | ICD-10-CM

## 2011-08-28 DIAGNOSIS — R110 Nausea: Secondary | ICD-10-CM

## 2011-08-31 ENCOUNTER — Other Ambulatory Visit: Payer: Self-pay | Admitting: Gastroenterology

## 2011-09-17 ENCOUNTER — Other Ambulatory Visit: Payer: Self-pay

## 2011-09-17 ENCOUNTER — Telehealth: Payer: Self-pay | Admitting: Internal Medicine

## 2011-09-17 MED ORDER — VARENICLINE TARTRATE 0.5 MG PO TABS: 0.5000 mg | ORAL_TABLET | Freq: Two times a day (BID) | ORAL | Status: AC

## 2011-09-17 NOTE — Telephone Encounter (Signed)
Call in Chantix starter pack which will last 30 days- then pt needs to be seen for additional RX

## 2011-10-23 ENCOUNTER — Encounter: Payer: Self-pay | Admitting: Internal Medicine

## 2011-10-23 ENCOUNTER — Ambulatory Visit: Payer: PRIVATE HEALTH INSURANCE | Admitting: Internal Medicine

## 2012-03-28 ENCOUNTER — Ambulatory Visit (INDEPENDENT_AMBULATORY_CARE_PROVIDER_SITE_OTHER): Payer: PRIVATE HEALTH INSURANCE | Admitting: Internal Medicine

## 2012-03-28 ENCOUNTER — Encounter: Payer: Self-pay | Admitting: Internal Medicine

## 2012-03-28 VITALS — BP 112/56 | HR 84 | Temp 99.3°F | Wt 116.0 lb

## 2012-03-28 DIAGNOSIS — K709 Alcoholic liver disease, unspecified: Secondary | ICD-10-CM | POA: Insufficient documentation

## 2012-03-28 DIAGNOSIS — Z853 Personal history of malignant neoplasm of breast: Secondary | ICD-10-CM | POA: Insufficient documentation

## 2012-03-28 DIAGNOSIS — F329 Major depressive disorder, single episode, unspecified: Secondary | ICD-10-CM

## 2012-03-28 DIAGNOSIS — Z87891 Personal history of nicotine dependence: Secondary | ICD-10-CM | POA: Insufficient documentation

## 2012-03-28 DIAGNOSIS — F988 Other specified behavioral and emotional disorders with onset usually occurring in childhood and adolescence: Secondary | ICD-10-CM | POA: Insufficient documentation

## 2012-03-28 DIAGNOSIS — R188 Other ascites: Secondary | ICD-10-CM

## 2012-03-28 LAB — CBC WITH DIFFERENTIAL/PLATELET
Basophils Relative: 1 % (ref 0–1)
Eosinophils Absolute: 0.1 10*3/uL (ref 0.0–0.7)
Eosinophils Relative: 1 % (ref 0–5)
Hemoglobin: 11.3 g/dL — ABNORMAL LOW (ref 12.0–15.0)
Lymphs Abs: 1.9 10*3/uL (ref 0.7–4.0)
MCH: 34.6 pg — ABNORMAL HIGH (ref 26.0–34.0)
MCHC: 35.4 g/dL (ref 30.0–36.0)
MCV: 97.6 fL (ref 78.0–100.0)
Monocytes Absolute: 0.5 10*3/uL (ref 0.1–1.0)
Monocytes Relative: 6 % (ref 3–12)
Neutrophils Relative %: 70 % (ref 43–77)

## 2012-03-28 NOTE — Patient Instructions (Addendum)
Await results of lab studies. We will try to get appointment with Dr. Matthias Hughs later this week.

## 2012-03-28 NOTE — Progress Notes (Signed)
  Subjective:    Patient ID: Teresa Schneider, female    DOB: [birth year], 66 y.o.   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 Ulyses Jarred he for alcoholic hepatitis and alcoholic liver disease. This was diagnosed in the spring of 2013. She subsequently quit drinking after 31 day stay at [facility] and subsequently resumed drinking in the summer. Dr. Ulyses Jarred he saw her in September. She had no evidence of ascites at that time. He saw her again in mid October. She continued to drink heavily. Patient states that she did not go to Alcoholics Anonymous after her rehabilitation stay. She had no ascites in October. White blood cell count is 14,000, hemoglobin 11.9 g, platelet count 139,000. Electrolytes were within normal limits. She had an albumin of 3.2, total bilirubin of 8.7, alkaline phosphatase 345, AST 107, ALT 32. Increased white blood cell count was thought to be due to liver inflammation. Patient since has stopped drinking and has been off alcohol since that time. No withdrawal symptoms apparently. Accompanied today by Dr. Delton Prairie. Patient lives in a garage apartment at Dr. Essie Hart. Patient admits to have been depressed and that was why she was drinking. Has been depressed since her mother died which has been a few years ago. She had a sister died by suicide. Patient is seen here infrequently. History of left breast carcinoma diagnosed in 2010. She had a prior right mastectomy in 2008 for ductal carcinoma in situ with reconstruction surgery. History of hemochromatosis mutation. Was followed by Dr. Park Breed in the past for oncology. I don't think she has been seen by Dr. Park Breed recently.  Patient has had significant weight loss. In 2011 she weighed 123 pounds. Today she weighs 116 pounds and with Dr. Ulyses Jarred he saw her a month ago she weighed 107 pounds.  Father died at age 21 of cancer. Mother died of dementia. One brother with history of congestive heart failure.  No children. She has a Event organiser. Has smoked for over 30 years.  History of breast implants 1984.  Dr. Jarold Motto did colonoscopy 09/11/2009. History of diverticulosis.  History of osteopenia with T score -2.3 in the left femur and -1.7 and LS-spine in 2010. At one point patient took [place]. Last Pap smear on file 11/25/2009.  In July 2011, SGOT was 80 and SGPT was 62. Total bilirubin was 1.0 and alkaline phosphatase was 112. B12 and folate levels were normal despite an elevated MCV of 106.9. Hemoglobin at that time was 15.1 g.  Today patient is complaining of abdominal distention and some minor discomfort in her abdomen. Says she has been eating much better recently.    Review of Systems     Objective:   Physical Exam skin is jaundiced. Mild scleral icterus. Pharynx is clear. Neck is supple. Chest clear to auscultation. Cardiac exam regular rate and rhythm. Abdomen is distended but not tense. Very minor tenderness without rebound. Extremities without edema. Patient is rambling quite a bit today but that is not unusual for her since she has attention deficit disorder. She is alert and appears to be oriented.        Assessment & Plan:  Alcoholic liver disease with ascites  Depression  Attention deficit disorder  History of smoking  History of breast cancer bilateral  Plan: CBC and C-met drawn. Have asked Dr. Ulyses Jarred to see her later this week. Have recommended that she get involved with Alcoholics Anonymous.

## 2012-03-29 LAB — COMPREHENSIVE METABOLIC PANEL
AST: 94 U/L — ABNORMAL HIGH (ref 0–37)
Albumin: 3.1 g/dL — ABNORMAL LOW (ref 3.5–5.2)
Alkaline Phosphatase: 216 U/L — ABNORMAL HIGH (ref 39–117)
BUN: 9 mg/dL (ref 6–23)
Creat: 0.44 mg/dL — ABNORMAL LOW (ref 0.50–1.10)
Glucose, Bld: 96 mg/dL (ref 70–99)
Potassium: 3.4 mEq/L — ABNORMAL LOW (ref 3.5–5.3)

## 2012-03-30 ENCOUNTER — Other Ambulatory Visit (HOSPITAL_COMMUNITY): Payer: Self-pay | Admitting: Gastroenterology

## 2012-03-30 DIAGNOSIS — R188 Other ascites: Secondary | ICD-10-CM

## 2012-04-01 ENCOUNTER — Ambulatory Visit (HOSPITAL_COMMUNITY)
Admission: RE | Admit: 2012-04-01 | Discharge: 2012-04-01 | Disposition: A | Discharge status: Home / Self Care | Payer: PRIVATE HEALTH INSURANCE | Source: Ambulatory Visit | Attending: Gastroenterology | Admitting: Gastroenterology

## 2012-04-01 DIAGNOSIS — R161 Splenomegaly, not elsewhere classified: Secondary | ICD-10-CM | POA: Insufficient documentation

## 2012-04-01 DIAGNOSIS — J90 Pleural effusion, not elsewhere classified: Secondary | ICD-10-CM | POA: Insufficient documentation

## 2012-04-01 DIAGNOSIS — R188 Other ascites: Secondary | ICD-10-CM

## 2012-04-01 DIAGNOSIS — K7689 Other specified diseases of liver: Secondary | ICD-10-CM | POA: Insufficient documentation

## 2012-04-04 ENCOUNTER — Encounter: Payer: PRIVATE HEALTH INSURANCE | Attending: Gastroenterology | Admitting: Dietician

## 2012-04-04 ENCOUNTER — Encounter: Payer: Self-pay | Admitting: Dietician

## 2012-04-04 VITALS — Ht 62.13 in | Wt 112.8 lb

## 2012-04-04 DIAGNOSIS — K709 Alcoholic liver disease, unspecified: Secondary | ICD-10-CM | POA: Insufficient documentation

## 2012-04-04 DIAGNOSIS — R188 Other ascites: Secondary | ICD-10-CM | POA: Insufficient documentation

## 2012-04-04 DIAGNOSIS — Z713 Dietary counseling and surveillance: Secondary | ICD-10-CM | POA: Insufficient documentation

## 2012-04-04 NOTE — Progress Notes (Signed)
Assessment:  Pt is bright and receptive with recent wt loss of approximately 6 lbs. Pt notes large difference in abdominal distention.  Pt primary concern is ascites control secondary to alcoholic hepatitis. 2 g Na diet has been advised by MD. Pt also presents with recent labs of Hgb 11.9, MCV 108.3.   Abstinent from alcohol since Oct. 16th. Pt reports no food intake prior to Oct. 16th. Now able to eat small amounts of food (1-2 oz.) in a given sitting. Pt attempts to eat every 1-2 hours, preferably with protein.  Pt report attempts to control sodium.   Recall:(all meals 1-2 hours apart) 1- PB (1-2 tsp) 2- cereal with milk and sugar 3- pineapple 4- cinnamon bun 5- PF chang's beef (requested low Na version) 6- PB 7- bananas 8-chicken thigh (no skin, baked) + veg  Pt states up to 10 eating episodes of small portions (usually 1-2 oz.) Pt states large amounts of liquid intake (water and limeade, diet coke)  Nutrition Dx: Food and nutrition-related knowledge deficit related to sodium control as evidenced by significant ascites, pt statements about lack of knowledge of Na content, Na influence on ascites management. Inadeqaute kcal, protein, folate/B12 intake related to alcohol abuse as evidenced by pt statements of no food intake prior to alcohol abstinence, pt complaints of weakness, and macrocytic anemia (low Hgb, high MCV).   Intervention: RD discussed with pt importance of food intake, as tolerated, at regular intervals. RD instructed pt to eat 6-9 oz. lean meat per day, or 4 sittings at current average intake. RD instructed pt to engage in regular physical activity to improve strength, appetite.  Per MD instructions, RD provided counseling on Na restriction, with advisement of 1200 mg Na daily based on current food intake. Primary concerns for Na restriction are processed (pre-cooked canned and frozen foods), and restaurant foods. Pt expressed understanding of high Na foods. RD instructed  pt to avoid very fatty foods based on symptoms (if n/v/d present, discontinue fatty food intake). RD also instructed pt to limit simple sugar intake in liquid form (limeade, juices, sodas), and focus on water or water flavored with fruit as primary beverages.   M/E: Goals:  Ms. Tabares will continue to eat at regular intervals, as much as tolerated.  She will focus on adequate protein intake by eating high protein foods at least 4 eating episodes a day. She will focus on gradually eating larger portions to gain lean tissue and strength. She will be physically active for a combined total of at least 30 minutes per day to promote appetite, reduced nausea, and increase strength. She will do light, weight-bearing activities as tolerated, such as walking, gardening, and chair exercises such as shoulder raises and leg raises. She will limit sodium to approximately 1200 mg per day by avoiding processed foods.  She will reduce fatty food intake as symptoms (nausea, diarrhea) dictate. She will continue fluid intake of mostly water or lightly flavored water.   Follow-up in 4 weeks, after next appointment with MD.

## 2012-04-04 NOTE — Patient Instructions (Addendum)
Goals:  Teresa Schneider will continue to eat at regular intervals, as much as tolerated.  She will focus on adequate protein intake by eating high protein foods at least 4 eating episodes a day. She will focus on gradually eating larger portions to gain lean tissue and strength. She will be physically active for a combined total of at least 30 minutes per day to promote appetite, reduced nausea, and increase strength. She will do light, weight-bearing activities as tolerated, such as walking, gardening, and chair exercises such as shoulder raises and leg raises. She will limit sodium to approximately 1200 mg per day by avoiding processed foods.  She will reduce fatty food intake as symptoms (nausea, diarrhea) dictate. She will continue fluid intake of mostly water or lightly flavored water.

## 2012-04-29 ENCOUNTER — Encounter: Payer: Self-pay | Admitting: Dietician

## 2012-04-29 ENCOUNTER — Encounter: Payer: PRIVATE HEALTH INSURANCE | Attending: Gastroenterology | Admitting: Dietician

## 2012-04-29 VITALS — Ht 62.17 in | Wt 112.7 lb

## 2012-04-29 DIAGNOSIS — Z713 Dietary counseling and surveillance: Secondary | ICD-10-CM | POA: Insufficient documentation

## 2012-04-29 DIAGNOSIS — K709 Alcoholic liver disease, unspecified: Secondary | ICD-10-CM

## 2012-04-29 DIAGNOSIS — R188 Other ascites: Secondary | ICD-10-CM | POA: Insufficient documentation

## 2012-04-29 NOTE — Progress Notes (Signed)
A: Pt states eating better, tolerating more food. Has been able to increase activity- walking to end of street and back, some leg stretching. Some exercises from prior hip rehab. Pt states less discomfort with squatting, easier ability to move up and down from seated position.   Pt states no diarrhea, no steatorrhea. Pt claims no discoloration or darkly colored urine. Pt has complained of bad cramping after a large, salty meal at [facility].  Pt has clearly complied well with essentially all diet and activity modifications. Pt does complain that water is "boring."  Pt states able to increase activity, continue current dietary modifications.  Pt is concerned with cramping in abdomen, but has been advised by MD that cramping will reduce with proper diet and activity modifications, abstinence from alcohol.   Pt has managed to maintain weight, which is encouraging to prevent loss of muscle tissue, and pt statements of improved strength and endurance follow this. Pt claims she feels "200% better".  Dx: See prior.  I: Continue diet modifications of multiple small meals throughout day, with approximately 6-9 oz. Proteins. Reduce simple sugar intake by limiting sweet beverage consumption. Pt may try herbal teas, crystal light as options outside water.   Pt will increase PA as tolerated to 10 minute sessions of walking or rehab exercises spread throughout the day, to total 30 minutes per day on most days.  Pt was educated on high sodium content of most restaurant foods. Pt instructed to avoid high food intake at any one sitting at this time to prevent discomfort, high sodium intake.  M/E: See pt in approximately 1 month, after next visit with MD.  Pt will increase PA, continue current dietary modifications.

## 2012-05-26 ENCOUNTER — Ambulatory Visit (INDEPENDENT_AMBULATORY_CARE_PROVIDER_SITE_OTHER): Payer: Medicare Other | Admitting: Internal Medicine

## 2012-05-26 ENCOUNTER — Encounter: Payer: Self-pay | Admitting: Internal Medicine

## 2012-05-26 VITALS — BP 110/54 | HR 76 | Temp 98.6°F | Wt 108.0 lb

## 2012-05-26 DIAGNOSIS — J40 Bronchitis, not specified as acute or chronic: Secondary | ICD-10-CM

## 2012-05-26 MED ORDER — CEFTRIAXONE SODIUM 1 G IJ SOLR
1.0000 g | Freq: Once | INTRAMUSCULAR | Status: AC
  Administered 2012-05-26: 1 g via INTRAMUSCULAR

## 2012-05-26 NOTE — Patient Instructions (Addendum)
Take Augmentin 500 mg with food 3 times daily for 10 days. Tessalon Perles 100 mg 1 by mouth 3 times daily as needed for cough. Call if not better next week or sooner if worse

## 2012-06-03 ENCOUNTER — Ambulatory Visit: Payer: PRIVATE HEALTH INSURANCE | Admitting: Dietician

## 2012-06-09 ENCOUNTER — Encounter (HOSPITAL_COMMUNITY): Payer: Self-pay | Admitting: *Deleted

## 2012-06-09 ENCOUNTER — Emergency Department (INDEPENDENT_AMBULATORY_CARE_PROVIDER_SITE_OTHER)
Admission: EM | Admit: 2012-06-09 | Discharge: 2012-06-09 | Disposition: A | Discharge status: Home / Self Care | Payer: Medicare Other | Source: Home / Self Care

## 2012-06-09 DIAGNOSIS — L509 Urticaria, unspecified: Secondary | ICD-10-CM

## 2012-06-09 MED ORDER — DIPHENHYDRAMINE HCL 50 MG/ML IJ SOLN
INTRAMUSCULAR | Status: AC
  Filled 2012-06-09: qty 1

## 2012-06-09 MED ORDER — DIPHENHYDRAMINE HCL 50 MG/ML IJ SOLN
25.0000 mg | Freq: Once | INTRAMUSCULAR | Status: AC
  Administered 2012-06-09: 25 mg via INTRAMUSCULAR

## 2012-06-09 MED ORDER — METHYLPREDNISOLONE SODIUM SUCC 125 MG IJ SOLR
125.0000 mg | Freq: Once | INTRAMUSCULAR | Status: AC
  Administered 2012-06-09: 125 mg via INTRAMUSCULAR

## 2012-06-09 MED ORDER — METHYLPREDNISOLONE SODIUM SUCC 125 MG IJ SOLR
INTRAMUSCULAR | Status: AC
  Filled 2012-06-09: qty 2

## 2012-06-09 MED ORDER — PREDNISONE 10 MG PO TABS: ORAL_TABLET | ORAL | Status: DC

## 2012-06-09 NOTE — ED Provider Notes (Signed)
History     CSN: [id number]  Arrival date & time 06/09/12  1009   None     Chief Complaint  Patient presents with  . Rash    (Consider location/radiation/quality/duration/timing/severity/associated sxs/prior treatment) Patient is a 67 y.o. female presenting with rash. The history is provided by the patient.  Rash  This is a new problem. The current episode started 2 days ago. The problem has been gradually worsening. The problem is associated with nothing. There has been no fever. The rash is present on the neck, abdomen, back and torso. The pain is moderate. The pain has been constant since onset. Associated symptoms include itching. She has tried nothing for the symptoms. The treatment provided no relief.  Pt complains of an itchy rash.  No new chemicals, no new foods,  Past Medical History  Diagnosis Date  . Alcoholic hepatitis   . Ascites     Past Surgical History  Procedure Date  . Joint replacement   . Mastectomy     History reviewed. No pertinent family history.  History  Substance Use Topics  . Smoking status: Current Every Day Smoker -- 1.0 packs/day for 35 years    Types: Cigarettes  . Smokeless tobacco: Never Used  . Alcohol Use: Yes    OB History    Grav Para Term Preterm Abortions TAB SAB Ect Mult Living                  Review of Systems  Skin: Positive for itching and rash.  All other systems reviewed and are negative.    Allergies  Salmon; Other; and Sulfamethoxazole w-trimethoprim  Home Medications   Current Outpatient Rx  Name  Route  Sig  Dispense  Refill  . DIPHENHYDRAMINE HCL 25 MG PO CAPS   Oral   Take 25 mg by mouth at bedtime as needed.         Marland Kitchen LOPERAMIDE HCL 2 MG PO TABS   Oral   Take 2 mg by mouth as needed.           BP 119/66  Pulse 68  Temp 97.8 F (36.6 C) (Oral)  Resp 16  SpO2 97%  Physical Exam  Vitals reviewed. Constitutional: She appears well-developed and well-nourished.  HENT:  Head:  Normocephalic.  Eyes: Conjunctivae normal are normal. Pupils are equal, round, and reactive to light.  Neck: Normal range of motion.  Cardiovascular: Normal rate.   Pulmonary/Chest: Effort normal.  Musculoskeletal: Normal range of motion.  Neurological: She is alert.  Skin: Rash noted. There is erythema.       Hives and multiple scratch marks    ED Course  Procedures (including critical care time)  Labs Reviewed - No data to display No results found.   No diagnosis found.    MDM  Pt given solumedrol and benadryl IM.   Pt given 6 day steroid taper,  Pt advised benadryl every 4 hours.         Lonia Skinner [place], [place] 06/09/12 1048

## 2012-06-09 NOTE — ED Notes (Signed)
Pt reports rash since Tuesday all over body that has spread and is itching - no relief from cortisone cream or benadryl po q 4 hrs.

## 2012-06-09 NOTE — ED Provider Notes (Signed)
The patient was told that the differential diagnosis includes heart disease, and that it was of the utmost importance to followup with the specialist to whom she was referred.   Reuben Likes, MD 06/09/12 [phone]

## 2012-06-18 NOTE — Progress Notes (Signed)
  Subjective:    Patient ID: Teresa Schneider, female    DOB: 19-Apr-1946, 67 y.o.   MRN: [id number]  HPI 67 year old white female with URI symptoms. Has had cough and congestion. History of cirrhosis of the liver. Has been doing better recently but still has ascites. Says she is not drinking. Continues to be followed by Dr. Matthias Hughs. No fever or shaking chills. History of smoking.    Review of Systems     Objective:   Physical Exam skin is warm and dry. She is alert and oriented x3. Affect is appropriate. Chest clear.dictation without rales or wheezing. TMs and pharynx are clear. Neck is supple without significant adenopathy or JVD. Ascites is present. Abdomen is distended.        Assessment & Plan:  Bronchitis  History of smoking  Cirrhosis of the liver  Plan: Augmentin 500 mg by mouth 3 times a day for 10 days. Tessalon Perles 100 mg (#60) 2 by mouth 3 times a day when necessary cough

## 2012-09-05 ENCOUNTER — Ambulatory Visit: Payer: Medicare Other | Admitting: Internal Medicine

## 2012-12-21 ENCOUNTER — Other Ambulatory Visit: Payer: Self-pay

## 2013-03-10 ENCOUNTER — Ambulatory Visit
Admission: RE | Admit: 2013-03-10 | Discharge: 2013-03-10 | Disposition: A | Discharge status: Home / Self Care | Payer: Medicare Other | Source: Ambulatory Visit | Attending: Internal Medicine | Admitting: Internal Medicine

## 2013-03-10 ENCOUNTER — Ambulatory Visit (INDEPENDENT_AMBULATORY_CARE_PROVIDER_SITE_OTHER): Payer: Medicare Other | Admitting: Internal Medicine

## 2013-03-10 ENCOUNTER — Encounter: Payer: Self-pay | Admitting: Internal Medicine

## 2013-03-10 ENCOUNTER — Telehealth: Payer: Self-pay | Admitting: Internal Medicine

## 2013-03-10 VITALS — BP 138/70 | HR 92 | Temp 96.6°F | Ht 63.0 in | Wt 119.0 lb

## 2013-03-10 DIAGNOSIS — R0781 Pleurodynia: Secondary | ICD-10-CM

## 2013-03-10 DIAGNOSIS — R079 Chest pain, unspecified: Secondary | ICD-10-CM

## 2013-03-10 NOTE — Telephone Encounter (Signed)
Patient verbalized understanding of all orders given.

## 2013-03-10 NOTE — Progress Notes (Signed)
  Subjective:    Patient ID: Teresa Schneider, female    DOB: [birth year], 67 y.o.   MRN: [id number]  HPI patient has history of alcoholic cirrhosis of the liver and prior history of breast cancer. Has developed some left posterior lateral rib cage pain. Noticed it when [place] the beach [place]. It seemed to improve for short time and now has recurred. She saw gastroenterologist recently regarding liver disease and was told it was not related to liver disease. Says pain has a burning quality and is intense when she coughs, sneezes, or yawns. No fall or trauma to the left chest area. Patient has smoked for many years. No recent URI symptoms.    Review of Systems     Objective:   Physical Exam patient is exquisitely tender along her left lateral rib cage area extending slightly posteriorly. There is actual point tenderness in the posterior left lower rib cage area. Chest is clear. No friction rub.        Assessment & Plan:  Left posterior lateral chest wall pain  History of breast cancer history of smoking  Plan: Left rib detail films and chest x-ray  Addendum: No definite rib fracture. Patient may have atelectasis or vague opacity consistent with bronchopneumonia left lower lobe. With history of smoking, we're going to do chest CT next week.

## 2013-03-10 NOTE — Patient Instructions (Signed)
Please have left rib detail and chest x-ray films.

## 2013-03-13 ENCOUNTER — Ambulatory Visit
Admission: RE | Admit: 2013-03-13 | Discharge: 2013-03-13 | Disposition: A | Discharge status: Home / Self Care | Payer: Medicare Other | Source: Ambulatory Visit | Attending: Internal Medicine | Admitting: Internal Medicine

## 2013-03-13 ENCOUNTER — Telehealth: Payer: Self-pay | Admitting: *Deleted

## 2013-03-13 ENCOUNTER — Telehealth: Payer: Self-pay | Admitting: Internal Medicine

## 2013-03-13 DIAGNOSIS — R0781 Pleurodynia: Secondary | ICD-10-CM

## 2013-03-13 MED ORDER — IOHEXOL 300 MG/ML  SOLN
75.0000 mL | Freq: Once | INTRAMUSCULAR | Status: AC | PRN
  Administered 2013-03-13: 75 mL via INTRAVENOUS

## 2013-03-13 NOTE — Progress Notes (Signed)
Spoke with patient to notify results; no pneumonia, no lung cancer.  Patient verbalizes understanding of the information but still wants to know why she is having this "dull ache".  Advised I'll let Dr. Lenord Fellers know that the ache was come and go and now is just a dull ache.  Advised patient that it will likely be Tuesday before she hears back with answer.  Patient understanding.

## 2013-03-13 NOTE — Telephone Encounter (Signed)
Notified pt of scheduled ct.  She verbalized understanding & stated imaging center said they could get her in today if she preferred.  She is going today.

## 2013-03-14 ENCOUNTER — Other Ambulatory Visit: Payer: Medicare Other

## 2013-03-14 NOTE — Telephone Encounter (Signed)
Spoke with patient and conveyed message per Dr. Lenord Fellers.  Patient verbalizes understanding of instructions.

## 2013-03-14 NOTE — Telephone Encounter (Signed)
Heating pad and Extra strength Tylenol- should get better. If not, physical therapy.

## 2013-03-22 ENCOUNTER — Other Ambulatory Visit: Payer: Self-pay | Admitting: *Deleted

## 2013-03-22 MED ORDER — TRIAMCINOLONE ACETONIDE 0.1 % EX CREA: 1.0000 "application " | TOPICAL_CREAM | Freq: Two times a day (BID) | CUTANEOUS | Status: DC

## 2013-10-10 ENCOUNTER — Telehealth: Payer: Self-pay

## 2013-10-10 NOTE — Telephone Encounter (Signed)
Left message to call back  

## 2013-10-10 NOTE — Telephone Encounter (Signed)
This is a cholesterol deposit. Should have fasting lipid panel checked. She may call opthamologist to see if removable. I am not sure.

## 2013-10-10 NOTE — Telephone Encounter (Signed)
Was diagnosed by optometrist with Xanthelasma. Wants to know how to get it removed.

## 2014-01-05 ENCOUNTER — Other Ambulatory Visit: Payer: Medicare Other | Admitting: Internal Medicine

## 2014-01-25 ENCOUNTER — Other Ambulatory Visit: Payer: Medicare Other | Admitting: Internal Medicine

## 2014-01-25 DIAGNOSIS — Z13220 Encounter for screening for lipoid disorders: Secondary | ICD-10-CM

## 2014-01-25 LAB — LIPID PANEL
CHOL/HDL RATIO: 3.7 ratio
CHOLESTEROL: 200 mg/dL (ref 0–200)
HDL: 54 mg/dL (ref >39)
LDL Cholesterol: 119 mg/dL — ABNORMAL HIGH (ref 0–99)
Triglycerides: 135 mg/dL (ref <150)
VLDL: 27 mg/dL (ref 0–40)

## 2014-02-05 ENCOUNTER — Telehealth: Payer: Self-pay

## 2014-02-05 NOTE — Telephone Encounter (Signed)
Patient informed of lipid results.

## 2014-03-29 ENCOUNTER — Ambulatory Visit (INDEPENDENT_AMBULATORY_CARE_PROVIDER_SITE_OTHER): Payer: Medicare Other | Admitting: Internal Medicine

## 2014-03-29 ENCOUNTER — Encounter: Payer: Self-pay | Admitting: Internal Medicine

## 2014-03-29 VITALS — BP 116/68 | HR 86 | Temp 98.7°F | Wt 118.0 lb

## 2014-03-29 DIAGNOSIS — Z720 Tobacco use: Secondary | ICD-10-CM

## 2014-03-29 DIAGNOSIS — R059 Cough, unspecified: Secondary | ICD-10-CM

## 2014-03-29 DIAGNOSIS — J209 Acute bronchitis, unspecified: Secondary | ICD-10-CM

## 2014-03-29 DIAGNOSIS — Z87891 Personal history of nicotine dependence: Secondary | ICD-10-CM

## 2014-03-29 DIAGNOSIS — K7030 Alcoholic cirrhosis of liver without ascites: Secondary | ICD-10-CM

## 2014-03-29 DIAGNOSIS — R05 Cough: Secondary | ICD-10-CM

## 2014-03-29 MED ORDER — LEVOFLOXACIN 250 MG PO TABS: 250.0000 mg | ORAL_TABLET | Freq: Every day | ORAL | Status: DC

## 2014-03-29 MED ORDER — CEFTRIAXONE SODIUM 1 G IJ SOLR
1.0000 g | Freq: Once | INTRAMUSCULAR | Status: AC
  Administered 2014-03-29: 1 g via INTRAMUSCULAR

## 2014-03-29 MED ORDER — ALBUTEROL SULFATE HFA 108 (90 BASE) MCG/ACT IN AERS: 2.0000 | INHALATION_SPRAY | Freq: Four times a day (QID) | RESPIRATORY_TRACT | Status: DC | PRN

## 2014-03-29 MED ORDER — BENZONATATE 200 MG PO CAPS: 200.0000 mg | ORAL_CAPSULE | Freq: Three times a day (TID) | ORAL | Status: DC

## 2014-03-29 NOTE — Progress Notes (Signed)
   Subjective:    Patient ID: Teresa Schneider, female    DOB: 11/11/1945, 67 y.o.   MRN: [id number]  HPI Has had cough and congestion for a number of days. Cough is productive. She is a smoker. History of cirrhosis of the liver. Is a recovering alcoholic.History of bipolar disorder.GI physician is Dr. Cristina Gong.. No fever or shaking chills.slight shortness of breath with coughing. No sore throat. No ear pain. 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 TMs are clear. Neck is supple without JVD thyromegaly or carotid bruits. Chest clear to auscultation without rales or wheezing. Cough is very congested      Assessment & Plan:  Acute bronchitis  History of smoking  History of cirrhosis of the liver-stable  Plan: Levaquin 250 mg daily for 10 days. Albuterol inhaler 2 sprays by mouth 4 times a day. Tessalon Perles 200 mg 3 times daily as needed for cough. Call if not better in 7-10 days or sooner if worse.

## 2014-03-29 NOTE — Patient Instructions (Signed)
Levaquin 250 mg daily for 10 days. Albuterol inhaler 2 sprays 4 times daily as needed. Tessalon Perles 3 times daily for cough. Call if not better in 7-10 days

## 2014-04-23 ENCOUNTER — Telehealth: Payer: Self-pay

## 2014-04-23 NOTE — Telephone Encounter (Signed)
Spoke with patient regarding flu vaccine, she received hers at [facility] in October 2015.

## 2014-06-22 DIAGNOSIS — H524 Presbyopia: Secondary | ICD-10-CM | POA: Diagnosis not present

## 2014-06-22 DIAGNOSIS — Z0100 Encounter for examination of eyes and vision without abnormal findings: Secondary | ICD-10-CM | POA: Diagnosis not present

## 2014-06-22 DIAGNOSIS — H52209 Unspecified astigmatism, unspecified eye: Secondary | ICD-10-CM | POA: Diagnosis not present

## 2014-06-22 DIAGNOSIS — H5203 Hypermetropia, bilateral: Secondary | ICD-10-CM | POA: Diagnosis not present

## 2014-08-15 DIAGNOSIS — M545 Low back pain: Secondary | ICD-10-CM | POA: Diagnosis not present

## 2014-08-15 DIAGNOSIS — M546 Pain in thoracic spine: Secondary | ICD-10-CM | POA: Diagnosis not present

## 2014-10-09 ENCOUNTER — Telehealth: Payer: Self-pay | Admitting: Internal Medicine

## 2014-10-09 NOTE — Telephone Encounter (Signed)
Patient called back to advise that she didn't mean Dr. Alfonso Ramus, she wanted to see Dr. Maia Breslow.  Did ANOTHER referral to Dr. Maia Breslow.  Referral Auth to Dr. Eddie Dibbles #[id number].   Faxed info to Dr. Sammuel Hines office @ [phone].  Mailed new auth # to patient and called patient to advise of new referral/auth #.

## 2014-10-09 NOTE — Telephone Encounter (Signed)
Patient requested referral to Dr. Alfonso Ramus for back pain.  Ok per Dr. Renold Genta.  Patient now has [facility] ID #[id number].  Eff 05/18/2014 and current.  PCP is Dr. Renold Genta.  Verified as of today on [employer].  Put in referral to Dr. Berle Mull effective today for 6 OV for 6 months.  Dx:  Low Back Pain.  Ref Josem Kaufmann #[id number].  Faxed ref Josem Kaufmann # to Dr. Lytle Michaels office @ [phone].  Mailed info to patient.  Also contacted patient by phone and provided auth #.  Patient advised that she can phone Dr. Minna Merritts office and make the appointment with the referral/auth #.

## 2014-10-11 ENCOUNTER — Other Ambulatory Visit: Payer: Self-pay | Admitting: Gastroenterology

## 2014-10-11 DIAGNOSIS — K769 Liver disease, unspecified: Secondary | ICD-10-CM

## 2014-10-11 DIAGNOSIS — K709 Alcoholic liver disease, unspecified: Secondary | ICD-10-CM | POA: Diagnosis not present

## 2014-10-22 DIAGNOSIS — S22000G Wedge compression fracture of unspecified thoracic vertebra, subsequent encounter for fracture with delayed healing: Secondary | ICD-10-CM | POA: Diagnosis not present

## 2014-10-22 DIAGNOSIS — F17210 Nicotine dependence, cigarettes, uncomplicated: Secondary | ICD-10-CM | POA: Diagnosis not present

## 2014-10-22 DIAGNOSIS — Z716 Tobacco abuse counseling: Secondary | ICD-10-CM | POA: Diagnosis not present

## 2014-10-23 ENCOUNTER — Telehealth: Payer: Self-pay | Admitting: Internal Medicine

## 2014-10-23 NOTE — Telephone Encounter (Signed)
Patient saw Dr. Maia Breslow on 6/6.  He wants her to have a Vitamin D3 level and Calcium M54.6 levels drawn.  I asked why he didn't do it.  Do you want her to go to [facility]?

## 2014-10-23 NOTE — Telephone Encounter (Signed)
Spoke to patient.  Patient scheduled to come in Friday, 10/26/14 for fasting labs.

## 2014-10-23 NOTE — Telephone Encounter (Signed)
Serum Calcium and Vitamin D level can be drawn here and forwarded to Dr. Eddie Dibbles. Make sure calcium is fasting.

## 2014-10-26 ENCOUNTER — Other Ambulatory Visit: Payer: Commercial Managed Care - HMO | Admitting: Internal Medicine

## 2014-10-26 DIAGNOSIS — M546 Pain in thoracic spine: Secondary | ICD-10-CM | POA: Diagnosis not present

## 2014-10-26 LAB — CALCIUM: Calcium: 9.1 mg/dL (ref 8.4–10.5)

## 2014-10-26 NOTE — Progress Notes (Signed)
Lab draw ordered for Dr Maia Breslow

## 2014-10-27 LAB — VITAMIN D 25 HYDROXY (VIT D DEFICIENCY, FRACTURES): VIT D 25 HYDROXY: 9 ng/mL — AB (ref 30–100)

## 2014-10-29 ENCOUNTER — Telehealth: Payer: Self-pay | Admitting: *Deleted

## 2014-10-29 ENCOUNTER — Other Ambulatory Visit: Payer: Self-pay | Admitting: Orthopedic Surgery

## 2014-10-29 DIAGNOSIS — M810 Age-related osteoporosis without current pathological fracture: Secondary | ICD-10-CM

## 2014-10-29 MED ORDER — ERGOCALCIFEROL 1.25 MG (50000 UT) PO CAPS: 50000.0000 [IU] | ORAL_CAPSULE | ORAL | Status: DC

## 2014-10-29 NOTE — Telephone Encounter (Signed)
Reviewed labs with patient given instructions for Vit D .

## 2014-10-31 DIAGNOSIS — M546 Pain in thoracic spine: Secondary | ICD-10-CM | POA: Diagnosis not present

## 2014-11-06 ENCOUNTER — Ambulatory Visit
Admission: RE | Admit: 2014-11-06 | Discharge: 2014-11-06 | Disposition: A | Discharge status: Home / Self Care | Payer: Commercial Managed Care - HMO | Source: Ambulatory Visit | Attending: Orthopedic Surgery | Admitting: Orthopedic Surgery

## 2014-11-06 DIAGNOSIS — M810 Age-related osteoporosis without current pathological fracture: Secondary | ICD-10-CM | POA: Diagnosis not present

## 2014-11-09 DIAGNOSIS — F17210 Nicotine dependence, cigarettes, uncomplicated: Secondary | ICD-10-CM | POA: Diagnosis not present

## 2014-11-09 DIAGNOSIS — Z716 Tobacco abuse counseling: Secondary | ICD-10-CM | POA: Diagnosis not present

## 2014-11-09 DIAGNOSIS — M546 Pain in thoracic spine: Secondary | ICD-10-CM | POA: Diagnosis not present

## 2014-11-14 DIAGNOSIS — S22000G Wedge compression fracture of unspecified thoracic vertebra, subsequent encounter for fracture with delayed healing: Secondary | ICD-10-CM | POA: Diagnosis not present

## 2014-11-14 DIAGNOSIS — M810 Age-related osteoporosis without current pathological fracture: Secondary | ICD-10-CM | POA: Diagnosis not present

## 2014-11-22 ENCOUNTER — Telehealth: Payer: Self-pay | Admitting: Internal Medicine

## 2014-11-22 NOTE — Telephone Encounter (Signed)
Needs OV to discuss

## 2014-11-22 NOTE — Telephone Encounter (Signed)
Patient would like a referral to a Dr. Phylliss Bob for a kyphoscophy.  He is an Network engineer.

## 2014-11-22 NOTE — Telephone Encounter (Signed)
Patient scheduled appt for next week.

## 2014-11-29 ENCOUNTER — Ambulatory Visit: Payer: Self-pay | Admitting: Internal Medicine

## 2014-12-11 ENCOUNTER — Ambulatory Visit
Admission: RE | Admit: 2014-12-11 | Discharge: 2014-12-11 | Disposition: A | Discharge status: Home / Self Care | Payer: Commercial Managed Care - HMO | Source: Ambulatory Visit | Attending: Gastroenterology | Admitting: Gastroenterology

## 2014-12-11 DIAGNOSIS — R161 Splenomegaly, not elsewhere classified: Secondary | ICD-10-CM | POA: Diagnosis not present

## 2014-12-11 DIAGNOSIS — K769 Liver disease, unspecified: Secondary | ICD-10-CM

## 2014-12-11 DIAGNOSIS — K7031 Alcoholic cirrhosis of liver with ascites: Secondary | ICD-10-CM | POA: Diagnosis not present

## 2014-12-11 DIAGNOSIS — K8020 Calculus of gallbladder without cholecystitis without obstruction: Secondary | ICD-10-CM | POA: Diagnosis not present

## 2014-12-18 ENCOUNTER — Other Ambulatory Visit: Payer: Self-pay | Admitting: Orthopedic Surgery

## 2014-12-18 DIAGNOSIS — M546 Pain in thoracic spine: Secondary | ICD-10-CM | POA: Diagnosis not present

## 2014-12-19 DIAGNOSIS — M810 Age-related osteoporosis without current pathological fracture: Secondary | ICD-10-CM | POA: Diagnosis not present

## 2014-12-19 DIAGNOSIS — M546 Pain in thoracic spine: Secondary | ICD-10-CM | POA: Diagnosis not present

## 2014-12-19 DIAGNOSIS — S22000G Wedge compression fracture of unspecified thoracic vertebra, subsequent encounter for fracture with delayed healing: Secondary | ICD-10-CM | POA: Diagnosis not present

## 2014-12-20 ENCOUNTER — Encounter (HOSPITAL_COMMUNITY)
Admission: RE | Admit: 2014-12-20 | Discharge: 2014-12-20 | Disposition: A | Discharge status: Home / Self Care | Payer: Commercial Managed Care - HMO | Source: Ambulatory Visit | Attending: Orthopedic Surgery | Admitting: Orthopedic Surgery

## 2014-12-20 ENCOUNTER — Ambulatory Visit (HOSPITAL_COMMUNITY)
Admission: RE | Admit: 2014-12-20 | Discharge: 2014-12-20 | Disposition: A | Discharge status: Home / Self Care | Payer: Commercial Managed Care - HMO | Source: Ambulatory Visit | Attending: Orthopedic Surgery | Admitting: Orthopedic Surgery

## 2014-12-20 ENCOUNTER — Encounter (HOSPITAL_COMMUNITY): Payer: Self-pay

## 2014-12-20 ENCOUNTER — Other Ambulatory Visit (HOSPITAL_COMMUNITY): Payer: Self-pay | Admitting: *Deleted

## 2014-12-20 DIAGNOSIS — Z01818 Encounter for other preprocedural examination: Secondary | ICD-10-CM

## 2014-12-20 DIAGNOSIS — R918 Other nonspecific abnormal finding of lung field: Secondary | ICD-10-CM | POA: Diagnosis not present

## 2014-12-20 DIAGNOSIS — D696 Thrombocytopenia, unspecified: Secondary | ICD-10-CM | POA: Diagnosis not present

## 2014-12-20 DIAGNOSIS — Z79899 Other long term (current) drug therapy: Secondary | ICD-10-CM | POA: Insufficient documentation

## 2014-12-20 DIAGNOSIS — Z01812 Encounter for preprocedural laboratory examination: Secondary | ICD-10-CM | POA: Diagnosis not present

## 2014-12-20 DIAGNOSIS — M4854XA Collapsed vertebra, not elsewhere classified, thoracic region, initial encounter for fracture: Secondary | ICD-10-CM | POA: Diagnosis not present

## 2014-12-20 DIAGNOSIS — K7460 Unspecified cirrhosis of liver: Secondary | ICD-10-CM | POA: Insufficient documentation

## 2014-12-20 DIAGNOSIS — G4733 Obstructive sleep apnea (adult) (pediatric): Secondary | ICD-10-CM | POA: Insufficient documentation

## 2014-12-20 DIAGNOSIS — I771 Stricture of artery: Secondary | ICD-10-CM | POA: Diagnosis not present

## 2014-12-20 DIAGNOSIS — I700 Atherosclerosis of aorta: Secondary | ICD-10-CM | POA: Diagnosis not present

## 2014-12-20 DIAGNOSIS — Z01810 Encounter for preprocedural cardiovascular examination: Secondary | ICD-10-CM | POA: Diagnosis not present

## 2014-12-20 DIAGNOSIS — F17210 Nicotine dependence, cigarettes, uncomplicated: Secondary | ICD-10-CM | POA: Diagnosis not present

## 2014-12-20 HISTORY — DX: Palpitations: R00.2

## 2014-12-20 HISTORY — DX: Major depressive disorder, single episode, unspecified: F32.9

## 2014-12-20 HISTORY — DX: Anemia, unspecified: D64.9

## 2014-12-20 HISTORY — DX: Sleep apnea, unspecified: G47.30

## 2014-12-20 HISTORY — DX: Other specified behavioral and emotional disorders with onset usually occurring in childhood and adolescence: F98.8

## 2014-12-20 HISTORY — DX: Depression, unspecified: F32.A

## 2014-12-20 LAB — CBC WITH DIFFERENTIAL/PLATELET
BASOS ABS: 0.1 10*3/uL (ref 0.0–0.1)
BASOS PCT: 1 % (ref 0–1)
EOS PCT: 2 % (ref 0–5)
Eosinophils Absolute: 0.1 10*3/uL (ref 0.0–0.7)
HCT: 35.8 % — ABNORMAL LOW (ref 36.0–46.0)
Hemoglobin: 12.4 g/dL (ref 12.0–15.0)
Lymphocytes Relative: 27 % (ref 12–46)
Lymphs Abs: 1.6 10*3/uL (ref 0.7–4.0)
MCH: 32.9 pg (ref 26.0–34.0)
MCHC: 34.6 g/dL (ref 30.0–36.0)
MCV: 95 fL (ref 78.0–100.0)
Monocytes Absolute: 0.5 10*3/uL (ref 0.1–1.0)
Monocytes Relative: 8 % (ref 3–12)
NEUTROS ABS: 3.7 10*3/uL (ref 1.7–7.7)
Neutrophils Relative %: 62 % (ref 43–77)
Platelets: 54 10*3/uL — ABNORMAL LOW (ref 150–400)
RBC: 3.77 MIL/uL — ABNORMAL LOW (ref 3.87–5.11)
RDW: 16.5 % — ABNORMAL HIGH (ref 11.5–15.5)
WBC: 6 10*3/uL (ref 4.0–10.5)

## 2014-12-20 LAB — SURGICAL PCR SCREEN
MRSA, PCR: NEGATIVE
Staphylococcus aureus: NEGATIVE

## 2014-12-20 LAB — PROTIME-INR
INR: 1.49 (ref 0.00–1.49)
Prothrombin Time: 18.1 seconds — ABNORMAL HIGH (ref 11.6–15.2)

## 2014-12-20 LAB — URINALYSIS, ROUTINE W REFLEX MICROSCOPIC
GLUCOSE, UA: NEGATIVE mg/dL
HGB URINE DIPSTICK: NEGATIVE
Ketones, ur: 15 mg/dL — AB
NITRITE: NEGATIVE
PROTEIN: NEGATIVE mg/dL
Specific Gravity, Urine: 1.022 (ref 1.005–1.030)
Urobilinogen, UA: 2 mg/dL — ABNORMAL HIGH (ref 0.0–1.0)
pH: 6 (ref 5.0–8.0)

## 2014-12-20 LAB — COMPREHENSIVE METABOLIC PANEL
ALBUMIN: 3.4 g/dL — AB (ref 3.5–5.0)
ALK PHOS: 129 U/L — AB (ref 38–126)
ALT: 26 U/L (ref 14–54)
AST: 57 U/L — AB (ref 15–41)
Anion gap: 10 (ref 5–15)
BILIRUBIN TOTAL: 2.4 mg/dL — AB (ref 0.3–1.2)
BUN: 17 mg/dL (ref 6–20)
CALCIUM: 9 mg/dL (ref 8.9–10.3)
CO2: 22 mmol/L (ref 22–32)
CREATININE: 0.79 mg/dL (ref 0.44–1.00)
Chloride: 106 mmol/L (ref 101–111)
GLUCOSE: 70 mg/dL (ref 65–99)
POTASSIUM: 3.4 mmol/L — AB (ref 3.5–5.1)
SODIUM: 138 mmol/L (ref 135–145)
Total Protein: 6.5 g/dL (ref 6.5–8.1)

## 2014-12-20 LAB — URINE MICROSCOPIC-ADD ON

## 2014-12-20 LAB — APTT: APTT: 33 s (ref 24–37)

## 2014-12-20 NOTE — Progress Notes (Signed)
Pt denies cardiac history, chest pain or sob. 

## 2014-12-20 NOTE — Pre-Procedure Instructions (Signed)
ANEYAH LORTZ  12/20/2014      Your procedure is scheduled on Wednesday, December 26, 2014 at 2:30 PM.   Report to [facility] Entrance "A" Admitting Office at 12:30 PM.   Call this number if you have problems the morning of surgery: [phone]   Any questions prior to day of surgery, please call [phone] between 8 & 4 PM.    Remember:  Do not eat food or drink liquids after midnight Tuesday, 12/25/14.  Take these medicines the morning of surgery with A SIP OF WATER: Hydrocodone - if needed, Tizanidine (Zanaflex) - if needed, Albuterol inhaler - if needed.   Do not wear jewelry, make-up or nail polish.  Do not wear lotions, powders, or perfumes.  You may wear deodorant.  Do not shave 48 hours prior to surgery.    Do not bring valuables to the hospital.  [facility] is not responsible for any belongings or valuables.  Contacts, dentures or bridgework may not be worn into surgery.  Leave your suitcase in the car.  After surgery it may be brought to your room.  For patients admitted to the hospital, discharge time will be determined by your treatment team.  Patients discharged the day of surgery will not be allowed to drive home.   Special instructions:  [facility] - Preparing for Surgery  Before surgery, you can play an important role.  Because skin is not sterile, your skin needs to be as free of germs as possible.  You can reduce the number of germs on you skin by washing with CHG (chlorahexidine gluconate) soap before surgery.  CHG is an antiseptic cleaner which kills germs and bonds with the skin to continue killing germs even after washing.  Please DO NOT use if you have an allergy to CHG or antibacterial soaps.  If your skin becomes reddened/irritated stop using the CHG and inform your nurse when you arrive at Short Stay.  Do not shave (including legs and underarms) for at least 48 hours prior to the first CHG shower.  You may shave your face.  Please  follow these instructions carefully:   1.  Shower with CHG Soap the night before surgery and the                                morning of Surgery.  2.  If you choose to wash your hair, wash your hair first as usual with your       normal shampoo.  3.  After you shampoo, rinse your hair and body thoroughly to remove the                      Shampoo.  4.  Use CHG as you would any other liquid soap.  You can apply chg directly       to the skin and wash gently with scrungie or a clean washcloth.  5.  Apply the CHG Soap to your body ONLY FROM THE NECK DOWN.        Do not use on open wounds or open sores.  Avoid contact with your eyes, ears, mouth and genitals (private parts).  Wash genitals (private parts) with your normal soap.  6.  Wash thoroughly, paying special attention to the area where your surgery        will be performed.  7.  Thoroughly rinse your body with warm  water from the neck down.  8.  DO NOT shower/wash with your normal soap after using and rinsing off       the CHG Soap.  9.  Pat yourself dry with a clean towel.            10.  Wear clean pajamas.            11.  Place clean sheets on your bed the night of your first shower and do not        sleep with pets.  Day of Surgery  Do not apply any lotions the morning of surgery.  Please wear clean clothes to the hospital.    Please read over the following fact sheets that you were given. Pain Booklet, Coughing and Deep Breathing, MRSA Information and Surgical Site Infection Prevention

## 2014-12-21 ENCOUNTER — Encounter (HOSPITAL_COMMUNITY): Payer: Self-pay | Admitting: Emergency Medicine

## 2014-12-21 NOTE — Progress Notes (Signed)
Anesthesia Chart Review:  Pt is 69 year old female scheduled for T8 kyphoplasty on 12/26/2014 with Dr. Lynann Bologna.   PCP is Dr. Tedra Senegal.   PMH includes: OSA (does not use CPAP), anemia, alcoholic hepatitis, ADD, breast cancer, palpitations. Current smoker. BMI 21.   Per notes by PCP 03/2014, pt is recovering alcoholic with history of cirrhosis of the liver. Notes from Dr. Cristina Gong with [facility] in media tab dated 12/08/2013 indicate pt still drinking; platelets 63 at that time.   Medications include: albuterol, dextroamphetamine, trazodone.   Preoperative labs reviewed.  Platelets 54. PT 18.1, not on anticoagulation. PTT normal. AST slightly elevated at 57, ALT normal.   Chest x-ray 12/20/2014 reviewed. Hyperinflation without acute infiltrate. Progressive compressive deformity of the superior endplate of approximately T8.   EKG 12/20/2014: NSR. Moderate voltage criteria for LVH, may be normal variant.  Reviewed case with Dr. Marcie Bal. Pt will need GI clearance for low platelets, elevated PT to attempt to optimize pt prior to surgery. Left voicemail for Carla in Dr. Laurena Bering office.   Willeen Cass, FNP-BC [facility] Short Stay Surgical Center/Anesthesiology Phone: [phone] 12/21/2014 3:51 PM

## 2014-12-26 ENCOUNTER — Encounter (HOSPITAL_COMMUNITY): Admission: RE | Payer: Self-pay | Source: Ambulatory Visit

## 2014-12-26 ENCOUNTER — Ambulatory Visit (HOSPITAL_COMMUNITY)
Admission: RE | Admit: 2014-12-26 | Payer: Commercial Managed Care - HMO | Source: Ambulatory Visit | Admitting: Orthopedic Surgery

## 2014-12-26 SURGERY — KYPHOPLASTY
Anesthesia: General

## 2015-01-04 DIAGNOSIS — H2513 Age-related nuclear cataract, bilateral: Secondary | ICD-10-CM | POA: Diagnosis not present

## 2015-01-04 DIAGNOSIS — H0014 Chalazion left upper eyelid: Secondary | ICD-10-CM | POA: Diagnosis not present

## 2015-01-29 DIAGNOSIS — H0015 Chalazion left lower eyelid: Secondary | ICD-10-CM | POA: Diagnosis not present

## 2015-02-14 DIAGNOSIS — C44311 Basal cell carcinoma of skin of nose: Secondary | ICD-10-CM | POA: Diagnosis not present

## 2015-02-14 DIAGNOSIS — L820 Inflamed seborrheic keratosis: Secondary | ICD-10-CM | POA: Diagnosis not present

## 2015-03-13 DIAGNOSIS — Z85828 Personal history of other malignant neoplasm of skin: Secondary | ICD-10-CM | POA: Diagnosis not present

## 2015-03-13 DIAGNOSIS — C44311 Basal cell carcinoma of skin of nose: Secondary | ICD-10-CM | POA: Diagnosis not present

## 2015-03-22 DIAGNOSIS — H43812 Vitreous degeneration, left eye: Secondary | ICD-10-CM | POA: Diagnosis not present

## 2015-04-02 DIAGNOSIS — K709 Alcoholic liver disease, unspecified: Secondary | ICD-10-CM | POA: Diagnosis not present

## 2015-04-19 ENCOUNTER — Other Ambulatory Visit: Payer: Self-pay | Admitting: Internal Medicine

## 2015-04-19 ENCOUNTER — Other Ambulatory Visit: Payer: Commercial Managed Care - HMO | Admitting: Internal Medicine

## 2015-04-19 DIAGNOSIS — M810 Age-related osteoporosis without current pathological fracture: Secondary | ICD-10-CM | POA: Diagnosis not present

## 2015-04-19 DIAGNOSIS — Z1329 Encounter for screening for other suspected endocrine disorder: Secondary | ICD-10-CM | POA: Diagnosis not present

## 2015-04-19 DIAGNOSIS — K709 Alcoholic liver disease, unspecified: Secondary | ICD-10-CM | POA: Diagnosis not present

## 2015-04-19 DIAGNOSIS — R7989 Other specified abnormal findings of blood chemistry: Secondary | ICD-10-CM | POA: Diagnosis not present

## 2015-04-19 DIAGNOSIS — Z0000 Encounter for general adult medical examination without abnormal findings: Secondary | ICD-10-CM | POA: Diagnosis not present

## 2015-04-19 DIAGNOSIS — Z1321 Encounter for screening for nutritional disorder: Secondary | ICD-10-CM | POA: Diagnosis not present

## 2015-04-19 LAB — COMPLETE METABOLIC PANEL WITH GFR
ALT: 20 U/L (ref 6–29)
AST: 49 U/L — AB (ref 10–35)
Albumin: 3.9 g/dL (ref 3.6–5.1)
Alkaline Phosphatase: 114 U/L (ref 33–130)
BUN: 20 mg/dL (ref 7–25)
CO2: 24 mmol/L (ref 20–31)
Calcium: 8.4 mg/dL — ABNORMAL LOW (ref 8.6–10.4)
Chloride: 109 mmol/L (ref 98–110)
Creat: 0.69 mg/dL (ref 0.50–0.99)
GFR, Est African American: >89 mL/min (ref >=60)
GFR, Est Non African American: >89 mL/min (ref >=60)
GLUCOSE: 81 mg/dL (ref 65–99)
POTASSIUM: 3.5 mmol/L (ref 3.5–5.3)
SODIUM: 141 mmol/L (ref 135–146)
Total Bilirubin: 2.9 mg/dL — ABNORMAL HIGH (ref 0.2–1.2)
Total Protein: 6.7 g/dL (ref 6.1–8.1)

## 2015-04-19 LAB — LIPID PANEL
CHOL/HDL RATIO: 3.4 ratio (ref <=5.0)
CHOLESTEROL: 160 mg/dL (ref 125–200)
HDL: 47 mg/dL (ref >=46)
LDL Cholesterol: 83 mg/dL (ref <130)
Triglycerides: 148 mg/dL (ref <150)
VLDL: 30 mg/dL (ref <30)

## 2015-04-19 LAB — TSH: TSH: 2.405 u[IU]/mL (ref 0.350–4.500)

## 2015-04-20 LAB — CBC WITH DIFFERENTIAL/PLATELET
Basophils Absolute: 0.1 10*3/uL (ref 0.0–0.1)
Basophils Relative: 1 % (ref 0–1)
EOS ABS: 0.2 10*3/uL (ref 0.0–0.7)
EOS PCT: 3 % (ref 0–5)
HCT: 37.6 % (ref 36.0–46.0)
Hemoglobin: 12.8 g/dL (ref 12.0–15.0)
Lymphocytes Relative: 33 % (ref 12–46)
Lymphs Abs: 2.1 10*3/uL (ref 0.7–4.0)
MCH: 32.8 pg (ref 26.0–34.0)
MCHC: 34 g/dL (ref 30.0–36.0)
MCV: 96.4 fL (ref 78.0–100.0)
MONO ABS: 0.6 10*3/uL (ref 0.1–1.0)
MONOS PCT: 9 % (ref 3–12)
MPV: 9.7 fL (ref 8.6–12.4)
Neutro Abs: 3.4 10*3/uL (ref 1.7–7.7)
Neutrophils Relative %: 54 % (ref 43–77)
Platelets: 69 10*3/uL — ABNORMAL LOW (ref 150–400)
RBC: 3.9 MIL/uL (ref 3.87–5.11)
RDW: 15.7 % — AB (ref 11.5–15.5)
WBC: 6.3 10*3/uL (ref 4.0–10.5)

## 2015-04-20 LAB — VITAMIN D 25 HYDROXY (VIT D DEFICIENCY, FRACTURES): Vit D, 25-Hydroxy: 33 ng/mL (ref 30–100)

## 2015-04-22 ENCOUNTER — Ambulatory Visit (INDEPENDENT_AMBULATORY_CARE_PROVIDER_SITE_OTHER): Payer: Commercial Managed Care - HMO | Admitting: Internal Medicine

## 2015-04-22 ENCOUNTER — Encounter: Payer: Self-pay | Admitting: Internal Medicine

## 2015-04-22 VITALS — BP 140/72 | HR 88 | Temp 98.2°F | Resp 22 | Ht 61.0 in | Wt 111.0 lb

## 2015-04-22 DIAGNOSIS — Z720 Tobacco use: Secondary | ICD-10-CM

## 2015-04-22 DIAGNOSIS — R740 Nonspecific elevation of levels of transaminase and lactic acid dehydrogenase [LDH]: Secondary | ICD-10-CM | POA: Diagnosis not present

## 2015-04-22 DIAGNOSIS — R17 Unspecified jaundice: Secondary | ICD-10-CM | POA: Diagnosis not present

## 2015-04-22 DIAGNOSIS — M8580 Other specified disorders of bone density and structure, unspecified site: Secondary | ICD-10-CM | POA: Diagnosis not present

## 2015-04-22 DIAGNOSIS — Z0000 Encounter for general adult medical examination without abnormal findings: Secondary | ICD-10-CM

## 2015-04-22 DIAGNOSIS — Z853 Personal history of malignant neoplasm of breast: Secondary | ICD-10-CM | POA: Diagnosis not present

## 2015-04-22 DIAGNOSIS — R7401 Elevation of levels of liver transaminase levels: Secondary | ICD-10-CM

## 2015-04-22 DIAGNOSIS — D696 Thrombocytopenia, unspecified: Secondary | ICD-10-CM

## 2015-04-22 DIAGNOSIS — Z8659 Personal history of other mental and behavioral disorders: Secondary | ICD-10-CM | POA: Diagnosis not present

## 2015-04-22 DIAGNOSIS — Z23 Encounter for immunization: Secondary | ICD-10-CM | POA: Diagnosis not present

## 2015-04-22 DIAGNOSIS — E8809 Other disorders of plasma-protein metabolism, not elsewhere classified: Secondary | ICD-10-CM | POA: Diagnosis not present

## 2015-04-22 DIAGNOSIS — Z87891 Personal history of nicotine dependence: Secondary | ICD-10-CM

## 2015-04-22 LAB — POCT URINALYSIS DIPSTICK
BILIRUBIN UA: NEGATIVE
Blood, UA: NEGATIVE
GLUCOSE UA: NEGATIVE
KETONES UA: NEGATIVE
Leukocytes, UA: NEGATIVE
NITRITE UA: NEGATIVE
PH UA: 6.5
Protein, UA: NEGATIVE
Spec Grav, UA: 1.025
Urobilinogen, UA: 0.2

## 2015-04-22 LAB — FOLATE: FOLATE: 4.2 ng/mL

## 2015-04-22 LAB — VITAMIN B12: Vitamin B-12: 805 pg/mL (ref 211–911)

## 2015-04-25 DIAGNOSIS — H43812 Vitreous degeneration, left eye: Secondary | ICD-10-CM | POA: Diagnosis not present

## 2015-05-02 ENCOUNTER — Telehealth: Payer: Self-pay | Admitting: Internal Medicine

## 2015-05-02 NOTE — Telephone Encounter (Signed)
Patient calling states she wants to see Dr. Maia Breslow one more time before he retired on 05/09/15.  Her [employer] referral had expired.  She's requesting a new referral for low back pain.  Completed referral.

## 2015-05-06 DIAGNOSIS — M546 Pain in thoracic spine: Secondary | ICD-10-CM | POA: Diagnosis not present

## 2015-05-06 DIAGNOSIS — M545 Low back pain: Secondary | ICD-10-CM | POA: Diagnosis not present

## 2015-05-20 DIAGNOSIS — J00 Acute nasopharyngitis [common cold]: Secondary | ICD-10-CM | POA: Diagnosis not present

## 2015-06-17 ENCOUNTER — Other Ambulatory Visit: Payer: Self-pay | Admitting: Internal Medicine

## 2015-07-14 ENCOUNTER — Encounter: Payer: Self-pay | Admitting: Internal Medicine

## 2015-07-14 NOTE — Patient Instructions (Addendum)
Please try to quit smoking. Take vitamin D supplement regularly. Continue same medications and return in 6 months. Abstain from drinking. Tried to eat well and take care of yourself.

## 2015-07-14 NOTE — Progress Notes (Signed)
Subjective:    Patient ID: Teresa Schneider, female    DOB: 1946/02/21, 70 y.o.   MRN: [id number]  HPI 70 year old White Female in today for health maintenance exam and evaluation of medical issues. She has a history of alcoholism. She received a DUI driving [place] from the beach a few months ago and had to do community service at [employer]. History of alcoholic liver disease followed by Dr. Cristina Gong. This was diagnosed in Spring of 2013. She subsequently quit drinking after 31 day stay at [facility]. In general has done much better with drinking. History of ascites in 2013. History of bipolar disorder. History of osteopenia.  Social history: She resides in a garage apartment on by Dr. Janet Berlin. Has been depressed since her mother died which was several years ago.  No children. She has a Masters degree in formerly operated a Technical brewer. Has smoked for over 30 years. No children. He is to have a beach house at [facility] but has sold that.  History of left breast carcinoma diagnosed in 2010. 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  History of hemachromatosis mutation. Used to see Dr. Lovett Calender for Oncology however has not seen oncologist recently.  Family history: Father died at age 45 of cancer. Mother died of dementia. One brother with history of congestive heart failure. Sister died by suicide.  In December 10, 2009 SGOT was 80 and SGPT was 62, total bilirubin was 1.0 and alkaline phosphatase was 112. B-12 and folate levels were normal despite elevated MCV of 106.9. Hemoglobin at that time was 15.1 g but she had history of heavy smoking.  Dr. Sharlett Iles did colonoscopy April 2011. History of diverticulosis.  History of breast implants 1984.  In 2013, she had significant weight loss from 123 pounds in 2011 to 107 pounds. She had endoscopy. Small bowel biopsy was negative.  History of osteopenia and vitamin D  deficiency.  In August, she was scheduled for a T8 kyphoplasty by Dr. Lynann Bologna  but had low platelet count and the procedure was canceled.  History of osteopenia with T score -2.3 in the left femur and -1.7 and the LS-spine and 2010. At one point patient took [place].    Review of Systems  Constitutional: Negative.   All other systems reviewed and are negative.      Objective:   Physical Exam  Constitutional: She is oriented to person, place, and time. No distress.  Thin white female in no acute distress  HENT:  Head: Normocephalic and atraumatic.  Right Ear: External ear normal.  Left Ear: External ear normal.  Mouth/Throat: Oropharynx is clear and moist.  Eyes: Conjunctivae are normal. Pupils are equal, round, and reactive to light. Right eye exhibits no discharge. Left eye exhibits no discharge. No scleral icterus.  History of bilateral breast implants. No masses.  Neck: Neck supple. No JVD present. No thyromegaly present.  Cardiovascular: Normal rate, regular rhythm and intact distal pulses.   No murmur heard. Pulmonary/Chest: Breath sounds normal. She has no wheezes. She has no rales.  Abdominal: Soft. Bowel sounds are normal. She exhibits no mass. There is no rebound and no guarding.  Genitourinary:  Deferred Pap smear. Bimanual normal. Last Pap on file 2011.  Lymphadenopathy:    She has no cervical adenopathy.  Neurological: She is alert and oriented to person, place, and time. She has normal reflexes. No cranial nerve deficit. Coordination normal.  Skin: Skin is  warm and dry. She is not diaphoretic.  Psychiatric: She has a normal mood and affect. Her behavior is normal. Thought content normal.  Vitals reviewed.         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 Platelet count 69,000  Mild elevation of SGOT of 49 with bilirubin of 2.2  History left breast cancer 2010.  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 Return in 6 months. Asked patient to stop smoking.

## 2015-07-29 DIAGNOSIS — K709 Alcoholic liver disease, unspecified: Secondary | ICD-10-CM | POA: Diagnosis not present

## 2015-07-30 DIAGNOSIS — K709 Alcoholic liver disease, unspecified: Secondary | ICD-10-CM | POA: Diagnosis not present

## 2015-08-21 DIAGNOSIS — Z85828 Personal history of other malignant neoplasm of skin: Secondary | ICD-10-CM | POA: Diagnosis not present

## 2015-08-21 DIAGNOSIS — L821 Other seborrheic keratosis: Secondary | ICD-10-CM | POA: Diagnosis not present

## 2015-08-21 DIAGNOSIS — L720 Epidermal cyst: Secondary | ICD-10-CM | POA: Diagnosis not present

## 2015-08-26 DIAGNOSIS — H5213 Myopia, bilateral: Secondary | ICD-10-CM | POA: Diagnosis not present

## 2015-08-26 DIAGNOSIS — H5210 Myopia, unspecified eye: Secondary | ICD-10-CM | POA: Diagnosis not present

## 2015-09-13 DIAGNOSIS — H2513 Age-related nuclear cataract, bilateral: Secondary | ICD-10-CM | POA: Diagnosis not present

## 2015-09-13 DIAGNOSIS — Z0100 Encounter for examination of eyes and vision without abnormal findings: Secondary | ICD-10-CM | POA: Diagnosis not present

## 2015-09-25 ENCOUNTER — Encounter: Payer: Self-pay | Admitting: Gastroenterology

## 2015-09-25 DIAGNOSIS — F1020 Alcohol dependence, uncomplicated: Secondary | ICD-10-CM | POA: Diagnosis not present

## 2015-10-04 ENCOUNTER — Telehealth: Payer: Self-pay | Admitting: Internal Medicine

## 2015-10-04 NOTE — Telephone Encounter (Signed)
Called patient back; provided the names of Denice Paradise and Araceli Bouche Rayle.  [phone].  Called patient back and provided information to her.  Patient is grateful for this information.

## 2015-10-04 NOTE — Telephone Encounter (Signed)
Patient called and requested the name of the person(s) from Dr. Renold Genta for Alcoholics Anonymous.  Please advise.  Thanks.

## 2015-10-07 DIAGNOSIS — J069 Acute upper respiratory infection, unspecified: Secondary | ICD-10-CM | POA: Diagnosis not present

## 2015-10-09 DIAGNOSIS — H2511 Age-related nuclear cataract, right eye: Secondary | ICD-10-CM | POA: Diagnosis not present

## 2015-10-09 DIAGNOSIS — H25811 Combined forms of age-related cataract, right eye: Secondary | ICD-10-CM | POA: Diagnosis not present

## 2015-10-11 DIAGNOSIS — Z720 Tobacco use: Secondary | ICD-10-CM | POA: Diagnosis not present

## 2015-10-11 DIAGNOSIS — J209 Acute bronchitis, unspecified: Secondary | ICD-10-CM | POA: Diagnosis not present

## 2015-10-16 DIAGNOSIS — J189 Pneumonia, unspecified organism: Secondary | ICD-10-CM | POA: Diagnosis not present

## 2015-10-18 ENCOUNTER — Encounter: Payer: Self-pay | Admitting: Internal Medicine

## 2015-10-18 ENCOUNTER — Ambulatory Visit (INDEPENDENT_AMBULATORY_CARE_PROVIDER_SITE_OTHER): Payer: Commercial Managed Care - HMO | Admitting: Internal Medicine

## 2015-10-18 VITALS — BP 124/60 | HR 86 | Temp 98.6°F | Resp 18 | Wt 113.5 lb

## 2015-10-18 DIAGNOSIS — Z720 Tobacco use: Secondary | ICD-10-CM | POA: Diagnosis not present

## 2015-10-18 DIAGNOSIS — Z87891 Personal history of nicotine dependence: Secondary | ICD-10-CM

## 2015-10-18 DIAGNOSIS — J189 Pneumonia, unspecified organism: Secondary | ICD-10-CM | POA: Diagnosis not present

## 2015-10-18 MED ORDER — LEVOFLOXACIN 500 MG PO TABS: 500.0000 mg | ORAL_TABLET | Freq: Every day | ORAL | Status: DC

## 2015-10-18 MED ORDER — CEFTRIAXONE SODIUM 1 G IJ SOLR
1.0000 g | INTRAMUSCULAR | Status: AC
  Administered 2015-10-18: 1 g via INTRAMUSCULAR

## 2015-10-18 NOTE — Patient Instructions (Addendum)
Rocephin 1 g IM. Levaquin 500 milligrams daily for 10 days. Return in one week.  Rest and drink plenty of fluids.

## 2015-10-18 NOTE — Progress Notes (Signed)
   Subjective:    Patient ID: Teresa Schneider, female    DOB: 29-Dec-1945, 70 y.o.   MRN: [id number]  HPI Patient was seen at [facility] on May 31 and was diagnosed with right lower lobe pneumonia. She was started on Zithromax. She was told follow-up here. She has cough and congestion, malaise and fatigue. She has long-standing history of smoking. Says she started with respiratory infection symptoms 2 weeks ago and [place] treated with Amoxicillin initially but did not improve.Coughing a great deal. Continues to smoke.  History of alcoholic liver disease followed by Dr. Cristina Gong  History of depression and bipolar disorder    Review of Systems see above     Objective:   Physical Exam Skin warm and dry. Nodes none. Neck is supple. Essentially clear to auscultation without rales or wheezing. Extremities without edema.       Assessment & Plan:  Right lower lobe pneumonia-currently on Zithromax  Plan: Change to Levaquin 500 mg daily for 10 days. Follow-up in one week. Rest and drink plenty of fluids. 1 g IM Rocephin given.

## 2015-10-21 ENCOUNTER — Other Ambulatory Visit: Payer: Self-pay

## 2015-10-21 MED ORDER — BENZONATATE 100 MG PO CAPS: 100.0000 mg | ORAL_CAPSULE | Freq: Three times a day (TID) | ORAL | Status: DC | PRN

## 2015-10-25 ENCOUNTER — Encounter: Payer: Self-pay | Admitting: Internal Medicine

## 2015-10-25 ENCOUNTER — Ambulatory Visit (INDEPENDENT_AMBULATORY_CARE_PROVIDER_SITE_OTHER): Payer: Commercial Managed Care - HMO | Admitting: Internal Medicine

## 2015-10-25 VITALS — BP 124/70 | HR 94 | Temp 99.1°F | Resp 18

## 2015-10-25 DIAGNOSIS — J189 Pneumonia, unspecified organism: Secondary | ICD-10-CM | POA: Diagnosis not present

## 2015-10-25 DIAGNOSIS — Z8701 Personal history of pneumonia (recurrent): Secondary | ICD-10-CM | POA: Insufficient documentation

## 2015-10-25 MED ORDER — LEVOFLOXACIN 500 MG PO TABS: 500.0000 mg | ORAL_TABLET | Freq: Every day | ORAL | Status: DC

## 2015-10-25 NOTE — Progress Notes (Signed)
   Subjective:    Patient ID: Teresa Schneider, female    DOB: 05-Dec-1945, 70 y.o.   MRN: [id number]  HPI Here today to follow up on pneumonia. Has been on Levaquin 500 mg daily is about to complete a 10 day course. She's feeling much better. Still has some congested cough. She's not getting a lot of rest because she is trying to take care of Dr. Manus Rudd.  She was seen at [facility] on May 31 and was diagnosed with right lower lobe pneumonia. She was started on Zithromax and was told to follow-up here. She began to have respiratory infection symptoms some 3 weeks ago while at the beach. Was initially seen at an Urgent Care there and treated with Amoxicillin but did not improve.  Has been doing some gardening and has begun to develop some dependent edema but is using a lot of salt to cook or green beans in.  Review of Systems no new complaints     Objective:   Physical Exam  Skin warm and dry. Nodes none. Neck is supple. Chest clear to auscultation without rales or wheezing. Trace pitting lower extremity edema.      Assessment & Plan:  Right lower lobe pneumonia-improving  History of alcoholic liver disease  Dependent edema-advised keep legs elevated and watch salt intake  Plan: Continue Levaquin for an additional 7 days. Is to have repeat CXR soon after completing Levaquin. Call if flexed continue to be swollen.

## 2015-10-25 NOTE — Patient Instructions (Signed)
Levaquin has been prescribed for an additional 7 days. Have repeat chest x-ray after that. Call if legs continue to swell. Watch salt intake and drink plenty of fluids.

## 2015-10-30 ENCOUNTER — Ambulatory Visit
Admission: RE | Admit: 2015-10-30 | Discharge: 2015-10-30 | Disposition: A | Discharge status: Home / Self Care | Payer: Commercial Managed Care - HMO | Source: Ambulatory Visit | Attending: Internal Medicine | Admitting: Internal Medicine

## 2015-10-30 DIAGNOSIS — J189 Pneumonia, unspecified organism: Secondary | ICD-10-CM | POA: Diagnosis not present

## 2015-11-07 DIAGNOSIS — K709 Alcoholic liver disease, unspecified: Secondary | ICD-10-CM | POA: Diagnosis not present

## 2015-11-11 ENCOUNTER — Ambulatory Visit (INDEPENDENT_AMBULATORY_CARE_PROVIDER_SITE_OTHER): Payer: Commercial Managed Care - HMO | Admitting: Internal Medicine

## 2015-11-11 ENCOUNTER — Encounter: Payer: Self-pay | Admitting: Internal Medicine

## 2015-11-11 VITALS — BP 138/88 | HR 110 | Temp 98.5°F | Resp 18 | Wt 110.0 lb

## 2015-11-11 DIAGNOSIS — Z8709 Personal history of other diseases of the respiratory system: Secondary | ICD-10-CM | POA: Diagnosis not present

## 2015-11-11 DIAGNOSIS — Z720 Tobacco use: Secondary | ICD-10-CM

## 2015-11-11 DIAGNOSIS — J189 Pneumonia, unspecified organism: Secondary | ICD-10-CM

## 2015-11-11 DIAGNOSIS — F17200 Nicotine dependence, unspecified, uncomplicated: Secondary | ICD-10-CM

## 2015-11-11 NOTE — Progress Notes (Signed)
   Subjective:    Patient ID: Teresa Schneider, female    DOB: April 03, 1946, 70 y.o.   MRN: [id number]  HPI Was here June 9 for follow-up on right lower lobe pneumonia which was diagnosed at an urgent care center here in [place] on May 31. Was started on Zithromax at urgent care Center. We saw her on June 9 and started her on Levaquin. She had a follow-up x-ray June 14 which was negative for pneumonia. She is here today for follow-up. She feels much better. Less fatigue. No coughing. Continues to smoke however. She was getting run down trying to take care of Dr. Manus Rudd who recently had back surgery. She is asking for help from Dr. Manus Rudd' family going forward so she can get some rest. She does have changes consistent with COPD on her chest x-ray. She also has some vertebral compression fractures which are asymptomatic at the present time.  She has alcoholic liver disease. She is followed by Dr. Cristina Gong    Review of Systems see above     Objective:   Physical Exam  Skin warm and dry. Nodes none. Neck is supple without JVD thyromegaly or carotid bruits. Chest clear to auscultation. Cardiac exam regular rate and rhythm. Extremities without edema.      Assessment & Plan:  Resolution of right lower lobe pneumonia  COPD  Alcoholic liver disease  Plan: Continue to use albuterol inhaler if needed for shortness of breath. Return as needed.

## 2015-11-11 NOTE — Patient Instructions (Signed)
It was pleasure to see you today. Use albuterol inhaler if needed for wheezing or shortness of breath. Otherwise return when necessary.

## 2015-11-20 DIAGNOSIS — H25812 Combined forms of age-related cataract, left eye: Secondary | ICD-10-CM | POA: Diagnosis not present

## 2015-11-20 DIAGNOSIS — H2512 Age-related nuclear cataract, left eye: Secondary | ICD-10-CM | POA: Diagnosis not present

## 2015-12-20 DIAGNOSIS — H43393 Other vitreous opacities, bilateral: Secondary | ICD-10-CM | POA: Diagnosis not present

## 2015-12-20 DIAGNOSIS — H1132 Conjunctival hemorrhage, left eye: Secondary | ICD-10-CM | POA: Diagnosis not present

## 2015-12-20 DIAGNOSIS — Z961 Presence of intraocular lens: Secondary | ICD-10-CM | POA: Diagnosis not present

## 2015-12-31 DIAGNOSIS — Z961 Presence of intraocular lens: Secondary | ICD-10-CM | POA: Diagnosis not present

## 2015-12-31 DIAGNOSIS — H35372 Puckering of macula, left eye: Secondary | ICD-10-CM | POA: Diagnosis not present

## 2015-12-31 DIAGNOSIS — H59032 Cystoid macular edema following cataract surgery, left eye: Secondary | ICD-10-CM | POA: Diagnosis not present

## 2016-02-04 DIAGNOSIS — H59032 Cystoid macular edema following cataract surgery, left eye: Secondary | ICD-10-CM | POA: Diagnosis not present

## 2016-02-25 ENCOUNTER — Telehealth: Payer: Self-pay | Admitting: Internal Medicine

## 2016-02-25 NOTE — Telephone Encounter (Signed)
Needs referral to Dr. Claudine Mouton [facility]

## 2016-02-25 NOTE — Telephone Encounter (Signed)
Patient needs a [employer] referral for the Dermatologist: Harriett Sine, Oct. 27, 2017 for follow up.

## 2016-03-06 ENCOUNTER — Telehealth: Payer: Self-pay | Admitting: Internal Medicine

## 2016-03-06 NOTE — Telephone Encounter (Signed)
Wants to know if you recommend her to have the Shingles Vaccine?  If so, can you write her a Rx for that?  Also, she had Pneumo 23 vaccine on 02/19/2007.  She had Prevnar 13 04/22/15.  Is she do to have anything further at this time?    Please advise and thanks.

## 2016-03-10 NOTE — Telephone Encounter (Signed)
Pt wondering if she needs the shingles vaccine, pneumo 23 (given 02/19/07), and Prevnar 13 (given 04/22/15). Please advise.

## 2016-03-11 NOTE — Telephone Encounter (Signed)
Unsure if you received this or not.  See 2 notes of Cathy calling regarding Shingles Rx.  Also wants to know about Prevnar.  Does she need to have a Prevnar again?  She has had both.  Please advise.   Thank you.

## 2016-03-11 NOTE — Telephone Encounter (Signed)
Patient notified regarding Pneumovax 23.  She'll plan to get it when she comes in for her CPE on 12/29.  The Zostavax Rx has been mailed to her.    Referral for Dr. Elvera Lennox has been put in to [employer] Website for her.  Referral [id number].

## 2016-03-11 NOTE — Telephone Encounter (Signed)
Rx for shingles vaccine written. May have pneumococcal 23 in December and that should catch her up entirely.

## 2016-03-11 NOTE — Telephone Encounter (Signed)
Patient advised that she will need the Pneumovax 23 due to having this prior to being 70 years old.  She is coming in on 12/29 for her CPE.  She will plan to get the shot at that time.  I am working on her [facility] referral to Dr. Elvera Lennox at this time.    And, I am putting her Zostavax Vaccine Rx in the mail at her request to:  [address] [place] [phone].

## 2016-03-13 DIAGNOSIS — L821 Other seborrheic keratosis: Secondary | ICD-10-CM | POA: Diagnosis not present

## 2016-03-13 DIAGNOSIS — Z85828 Personal history of other malignant neoplasm of skin: Secondary | ICD-10-CM | POA: Diagnosis not present

## 2016-03-13 DIAGNOSIS — L720 Epidermal cyst: Secondary | ICD-10-CM | POA: Diagnosis not present

## 2016-03-13 DIAGNOSIS — L853 Xerosis cutis: Secondary | ICD-10-CM | POA: Diagnosis not present

## 2016-03-13 DIAGNOSIS — D225 Melanocytic nevi of trunk: Secondary | ICD-10-CM | POA: Diagnosis not present

## 2016-04-07 DIAGNOSIS — Z0100 Encounter for examination of eyes and vision without abnormal findings: Secondary | ICD-10-CM | POA: Diagnosis not present

## 2016-04-07 DIAGNOSIS — H59031 Cystoid macular edema following cataract surgery, right eye: Secondary | ICD-10-CM | POA: Diagnosis not present

## 2016-04-07 DIAGNOSIS — Z961 Presence of intraocular lens: Secondary | ICD-10-CM | POA: Diagnosis not present

## 2016-05-04 ENCOUNTER — Other Ambulatory Visit: Payer: Commercial Managed Care - HMO | Admitting: Internal Medicine

## 2016-05-04 DIAGNOSIS — Z0000 Encounter for general adult medical examination without abnormal findings: Secondary | ICD-10-CM

## 2016-05-04 DIAGNOSIS — Z1329 Encounter for screening for other suspected endocrine disorder: Secondary | ICD-10-CM

## 2016-05-04 DIAGNOSIS — Z13220 Encounter for screening for lipoid disorders: Secondary | ICD-10-CM | POA: Diagnosis not present

## 2016-05-04 LAB — CBC WITH DIFFERENTIAL/PLATELET
BASOS PCT: 1 %
Basophils Absolute: 59 cells/uL (ref 0–200)
Eosinophils Absolute: 177 cells/uL (ref 15–500)
Eosinophils Relative: 3 %
HEMATOCRIT: 36.7 % (ref 35.0–45.0)
Hemoglobin: 12.3 g/dL (ref 11.7–15.5)
LYMPHS ABS: 1180 {cells}/uL (ref 850–3900)
Lymphocytes Relative: 20 %
MCH: 33.3 pg — ABNORMAL HIGH (ref 27.0–33.0)
MCHC: 33.5 g/dL (ref 32.0–36.0)
MCV: 99.5 fL (ref 80.0–100.0)
MONO ABS: 472 {cells}/uL (ref 200–950)
MPV: 9.8 fL (ref 7.5–12.5)
Monocytes Relative: 8 %
NEUTROS PCT: 68 %
Neutro Abs: 4012 cells/uL (ref 1500–7800)
Platelets: 49 10*3/uL — ABNORMAL LOW (ref 140–400)
RBC: 3.69 MIL/uL — AB (ref 3.80–5.10)
RDW: 14.9 % (ref 11.0–15.0)
WBC: 5.9 10*3/uL (ref 3.8–10.8)

## 2016-05-04 LAB — COMPLETE METABOLIC PANEL WITH GFR
ALBUMIN: 3.7 g/dL (ref 3.6–5.1)
ALK PHOS: 122 U/L (ref 33–130)
ALT: 23 U/L (ref 6–29)
AST: 50 U/L — AB (ref 10–35)
BUN: 24 mg/dL (ref 7–25)
CO2: 24 mmol/L (ref 20–31)
CREATININE: 0.67 mg/dL (ref 0.50–0.99)
Calcium: 9.1 mg/dL (ref 8.6–10.4)
Chloride: 110 mmol/L (ref 98–110)
GFR, Est African American: >89 mL/min (ref >=60)
GFR, Est Non African American: >89 mL/min (ref >=60)
Glucose, Bld: 88 mg/dL (ref 65–99)
Potassium: 4.1 mmol/L (ref 3.5–5.3)
Sodium: 141 mmol/L (ref 135–146)
Total Bilirubin: 2.6 mg/dL — ABNORMAL HIGH (ref 0.2–1.2)
Total Protein: 6.6 g/dL (ref 6.1–8.1)

## 2016-05-04 LAB — LIPID PANEL
Cholesterol: 144 mg/dL (ref <200)
HDL: 46 mg/dL — ABNORMAL LOW (ref >50)
LDL Cholesterol: 76 mg/dL (ref <100)
Total CHOL/HDL Ratio: 3.1 Ratio (ref <5.0)
Triglycerides: 111 mg/dL (ref <150)
VLDL: 22 mg/dL (ref <30)

## 2016-05-04 LAB — TSH: TSH: 2.61 mIU/L

## 2016-05-07 ENCOUNTER — Telehealth: Payer: Self-pay | Admitting: Internal Medicine

## 2016-05-07 NOTE — Telephone Encounter (Signed)
Patient calls with cough/congestion x 1 day; no fever.  States that she has the [employer] that you had provided her last year and she's using Flonase as well.  States that she's not coughing too much.  She wants to know if you think she needs a steroid shot so that she doesn't get as bad as she was last year.  She said she's trying to be proactive and trying not to get as sick as she was last year when she apparently had to have 3 steroid shots to get better.    Best # to reach her:  [phone]

## 2016-05-07 NOTE — Telephone Encounter (Signed)
See tomorrow

## 2016-05-07 NOTE — Telephone Encounter (Signed)
Spoke with patient and advised that Dr. Renold Genta wants to see her for appointment.  Appointment given for Friday, 12/22 @ 10:00 a.m.  Patient confirmed.

## 2016-05-08 ENCOUNTER — Ambulatory Visit (INDEPENDENT_AMBULATORY_CARE_PROVIDER_SITE_OTHER): Payer: Commercial Managed Care - HMO | Admitting: Internal Medicine

## 2016-05-08 ENCOUNTER — Encounter: Payer: Self-pay | Admitting: Internal Medicine

## 2016-05-08 VITALS — HR 94 | Temp 98.9°F | Wt 111.0 lb

## 2016-05-08 DIAGNOSIS — Z0000 Encounter for general adult medical examination without abnormal findings: Secondary | ICD-10-CM | POA: Diagnosis not present

## 2016-05-08 DIAGNOSIS — J209 Acute bronchitis, unspecified: Secondary | ICD-10-CM

## 2016-05-08 DIAGNOSIS — Z8709 Personal history of other diseases of the respiratory system: Secondary | ICD-10-CM

## 2016-05-08 MED ORDER — BENZONATATE 100 MG PO CAPS: 100.0000 mg | ORAL_CAPSULE | Freq: Three times a day (TID) | ORAL | 0 refills | Status: DC | PRN

## 2016-05-08 MED ORDER — PREDNISONE 10 MG PO TABS: ORAL_TABLET | ORAL | 0 refills | Status: DC

## 2016-05-08 MED ORDER — LEVOFLOXACIN 500 MG PO TABS: 500.0000 mg | ORAL_TABLET | Freq: Every day | ORAL | 0 refills | Status: DC

## 2016-05-08 MED ORDER — CEFTRIAXONE SODIUM 1 G IJ SOLR
1.0000 g | Freq: Once | INTRAMUSCULAR | Status: AC
  Administered 2016-05-08: 1 g via INTRAMUSCULAR

## 2016-05-08 NOTE — Progress Notes (Signed)
   Subjective:    Patient ID: Teresa Schneider, female    DOB: 02/06/1946, 70 y.o.   MRN: [id number]  HPI   70 year old Female with 2 day history of URI symptoms. Has been taking Tessalon Perles. Cough is productive. No fever or shaking chills. Has malaise and fatigue. Has been taking some leftover Tessalon Perles but is nearly out.    Review of Systems history of alcoholic liver disease     Objective:   Physical Exam Skin warm and dry. Nodes none. Pharynx is slightly injected. TMs are slightly full bilaterally but not red. She sounds a bit hoarse. Has congested cough. Neck is supple. Patient has bilateral rhonchi and occasional inspiratory wheezing. No rales.       Assessment & Plan:  Acute bronchitis  Bronchospasm  History smoking  Alcoholic liver disease  Plan: Levaquin 500 milligrams daily for 10 days. 1 g IM Rocephin given in left buttock today. Tessalon Perles 100 mg 3 times daily as needed for cough. Prednisone 10 mg tablets to take in tapering course starting with 60 mg decreasing to 0 mg over 7 days. Sample of Bevespi inhaler  to use one or 2 sprays  twice daily

## 2016-05-08 NOTE — Patient Instructions (Signed)
Levaquin 500 milligrams daily for 10 days. Take prednisone in tapering course as directed. Sample of Bevespi one or 2 sprays every 12 hours. Rocephin 1 g IM given in office. Rest and drink plenty of fluids. Tessalon Perles as needed for cough.

## 2016-05-13 ENCOUNTER — Encounter: Payer: Self-pay | Admitting: Internal Medicine

## 2016-05-13 ENCOUNTER — Ambulatory Visit (INDEPENDENT_AMBULATORY_CARE_PROVIDER_SITE_OTHER): Payer: Commercial Managed Care - HMO | Admitting: Internal Medicine

## 2016-05-13 VITALS — BP 132/68 | HR 100 | Temp 98.8°F | Ht 61.0 in | Wt 119.0 lb

## 2016-05-13 DIAGNOSIS — R740 Nonspecific elevation of levels of transaminase and lactic acid dehydrogenase [LDH]: Secondary | ICD-10-CM | POA: Diagnosis not present

## 2016-05-13 DIAGNOSIS — Z853 Personal history of malignant neoplasm of breast: Secondary | ICD-10-CM | POA: Diagnosis not present

## 2016-05-13 DIAGNOSIS — R17 Unspecified jaundice: Secondary | ICD-10-CM

## 2016-05-13 DIAGNOSIS — Z87891 Personal history of nicotine dependence: Secondary | ICD-10-CM | POA: Diagnosis not present

## 2016-05-13 DIAGNOSIS — D696 Thrombocytopenia, unspecified: Secondary | ICD-10-CM | POA: Diagnosis not present

## 2016-05-13 DIAGNOSIS — G4762 Sleep related leg cramps: Secondary | ICD-10-CM

## 2016-05-13 DIAGNOSIS — J209 Acute bronchitis, unspecified: Secondary | ICD-10-CM

## 2016-05-13 DIAGNOSIS — M791 Myalgia: Secondary | ICD-10-CM

## 2016-05-13 DIAGNOSIS — R609 Edema, unspecified: Secondary | ICD-10-CM | POA: Diagnosis not present

## 2016-05-13 DIAGNOSIS — Z8659 Personal history of other mental and behavioral disorders: Secondary | ICD-10-CM | POA: Diagnosis not present

## 2016-05-13 DIAGNOSIS — M8580 Other specified disorders of bone density and structure, unspecified site: Secondary | ICD-10-CM

## 2016-05-13 DIAGNOSIS — Z0000 Encounter for general adult medical examination without abnormal findings: Secondary | ICD-10-CM | POA: Diagnosis not present

## 2016-05-13 DIAGNOSIS — J441 Chronic obstructive pulmonary disease with (acute) exacerbation: Secondary | ICD-10-CM

## 2016-05-13 DIAGNOSIS — F909 Attention-deficit hyperactivity disorder, unspecified type: Secondary | ICD-10-CM

## 2016-05-13 DIAGNOSIS — M7918 Myalgia, other site: Secondary | ICD-10-CM

## 2016-05-13 DIAGNOSIS — F99 Mental disorder, not otherwise specified: Secondary | ICD-10-CM

## 2016-05-13 DIAGNOSIS — F5105 Insomnia due to other mental disorder: Secondary | ICD-10-CM

## 2016-05-13 DIAGNOSIS — J309 Allergic rhinitis, unspecified: Secondary | ICD-10-CM

## 2016-05-13 DIAGNOSIS — R7401 Elevation of levels of liver transaminase levels: Secondary | ICD-10-CM

## 2016-05-13 DIAGNOSIS — F1010 Alcohol abuse, uncomplicated: Secondary | ICD-10-CM

## 2016-05-13 LAB — POCT URINALYSIS DIPSTICK
Bilirubin, UA: NEGATIVE
Blood, UA: NEGATIVE
Glucose, UA: NEGATIVE
Ketones, UA: NEGATIVE
LEUKOCYTES UA: NEGATIVE
NITRITE UA: NEGATIVE
PROTEIN UA: NEGATIVE
Spec Grav, UA: 1.02
UROBILINOGEN UA: NEGATIVE
pH, UA: 6.5

## 2016-05-13 MED ORDER — FUROSEMIDE 40 MG PO TABS: ORAL_TABLET | ORAL | 3 refills | Status: DC

## 2016-05-13 NOTE — Progress Notes (Signed)
Subjective:    Patient ID: Teresa Schneider, female    DOB: 02-28-1946, 70 y.o.   MRN: [id number]  HPI  70 year old Female for Medicare health maintenance exam and evaluation of medical issues. She has a history of alcoholic liver disease.Is followed by Dr. Cristina Gong. This was diagnosed Spring 2013. She subsequently quit drinking for a period of time after 31 day stay at [employer] [facility].  Says now she's having 4 small drinks a week. Have previously given her name of individuals associated with Alcoholics Anonymous. She currently has no driver's license due to a DUI. Apparently lost her license for 2 years. Has to rely on others for transportation. Sometimes is able to hire [employer] for transportation.  Recently she's noticed some mild swelling in her lower extremities. She is out of Lasix.  Also wants some Celebrex for musculoskeletal pain.  Says she's not sleeping all that well. She is on trazodone. That could be increased from 50-100 mg at bedtime. She should still continue Silenor previously prescribed a Dr. Toy Care. She has a history of attention deficit disorder and is on amphetamines for that.  She has issues with muscle cramping particularly at night. I've asked her to take a magnesium supplement. She also takes gabapentin 3 times daily. Says when she takes it at night with her other medications that it makes her quite groggy.  She has a history of bipolar disorder. History of osteopenia. She had ascites in 2013 that resolved.  History of left breast carcinoma diagnosed in 2010. 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  History of hemachromatosis mutation.  Social history: She resides in a garage apartment owned by Dr. Janet Berlin. She has been depressed since her mother died a number of years ago. No children. 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 Has smoked for over 30 years.  Family history: Sister died by suicide. Father  died at age 45 of cancer. Mother died of dementia. One brother with history of congestive heart failure.    Review of Systems seen last week on December 22 for acute bronchitis treated with Levaquin, tapering course of prednisone, inhaler, IM Rocephin and has improved a great deal.     Objective:   Physical Exam  Constitutional: She is oriented to person, place, and time. She appears well-developed and well-nourished. No distress.  HENT:  Head: Normocephalic and atraumatic.  Right Ear: External ear normal.  Left Ear: External ear normal.  Mouth/Throat: Oropharynx is clear and moist. No oropharyngeal exudate.  Eyes: Conjunctivae are normal. Pupils are equal, round, and reactive to light. Right eye exhibits no discharge. Left eye exhibits no discharge. No scleral icterus.  Neck: Neck supple. No JVD present. No thyromegaly present.  Cardiovascular: Normal rate, regular rhythm, normal heart sounds and intact distal pulses.   No murmur heard. Pulmonary/Chest: Effort normal and breath sounds normal. No respiratory distress. She has no wheezes. She has no rales.  Abdominal: Soft. Bowel sounds are normal. She exhibits no distension and no mass. There is no tenderness. There is no rebound and no guarding.  Musculoskeletal:  Trace lower extremity edema without pitting  Lymphadenopathy:    She has no cervical adenopathy.  Neurological: She is alert and oriented to person, place, and time. She has normal reflexes. No cranial nerve deficit. Coordination normal.  Skin: Skin is warm and dry. No rash noted. She is not diaphoretic.  Psychiatric: She has a normal mood and  affect. Her behavior is normal. Judgment and thought content normal.  Vitals reviewed.         Assessment & Plan:  Acute bronchitis-resolving. Finish course of Levaquin and prednisone as prescribed on December 22  History of breast cancer bilaterally  Alcoholic liver disease-SGOT is 50 and SGPT is normal at 23. Total bilirubin  2.6 and previously was 2.9 last year  Alcohol abuse  Thrombocytopenia-platelet count 49,000 and needs to be watched. 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 Also has gabapentin 300 mg 3 times daily but doesn't take it that often. Takes doxepin (Silenor) 3 mg at bedtime- sometimes wakes up after 3 or 4 hours  Nocturnal leg cramps-recommend magnesium supplement  Osteopenia  Musculoskeletal pain-treat with Celebrex  Dependent edema-small quantity of Lasix 40 mg daily to take when necessary lower extremity edema but to use sparingly  Vitamin D deficiency  Plan: Return in one year or as needed. Use Lasix sparingly for dependent edema. Please go back to AA and quit  drinking alcohol.  Subjective:   Patient presents for Medicare Annual/Subsequent preventive examination.  Review Past Medical/Family/Social:See above   Risk Factors  Current exercise habits: Very active in terms of doing things about the home Dietary issues discussed: Yes  Cardiac risk factors:Smoking  Depression Screen  (Note: if answer to either of the following is "Yes", a more complete depression screening is indicated)   Over the past two weeks, have you felt down, depressed or hopeless? No  Over the past two weeks, have you felt little interest or pleasure in doing things? No Have you lost interest or pleasure in daily life? No Do you often feel hopeless? No Do you cry easily over simple problems? No   Activities of Daily Living  In your present state of health, do you have any difficulty performing the following activities?:   Driving? Currently unable to drive due to a DUI Managing money? Yes Feeding yourself? No   Getting from bed to chair? No  Climbing a flight of stairs? Yes gets out of breath easily Preparing food and eating?: No  Bathing or showering? No  Getting dressed: No  Getting to the toilet? No  Using the toilet:No  Moving around from place to place: No  In the past year have you fallen or had a near fall?:No  Are you sexually active? No  Do you have more than one partner? No   Hearing Difficulties: No  Do you often ask people to speak up or repeat themselves? No  Do you experience ringing or noises in your ears? No  Do you have difficulty understanding soft or whispered voices? No  Do you feel that you have a problem with memory? No Do you often misplace items? No    Home Safety:  Do you have a smoke alarm at your residence? Yes Do you have grab bars in the bathroom?Yes Do you have throw rugs in your house? No   Cognitive Testing  Alert? Yes Normal Appearance?Yes  Oriented to person? Yes Place? Yes  Time? Yes  Recall of three objects? Yes  Can perform simple calculations? Yes  Displays appropriate judgment?Yes  Can read the correct time from a watch face?Yes  List the Names of Other Physician/Practitioners you currently use:  See referral list for the physicians patient is currently seeing.     Review of Systems: See above   Objective:     General appearance: Appears stated age and Thin  Head: Normocephalic, without obvious abnormality, atraumatic  Eyes: conj clear, EOMi PEERLA  Ears: normal TM's and external ear canals both ears  Nose: Nares normal. Septum midline. Mucosa normal. No drainage or sinus tenderness.  Throat: lips, mucosa, and tongue normal; teeth and gums normal  Neck: no adenopathy, no carotid bruit, no JVD, supple, symmetrical, trachea midline and thyroid not enlarged, symmetric, no tenderness/mass/nodules  No CVA tenderness.  Lungs: clear to auscultation bilaterally  Breasts: normal appearance, no masses or tenderness, top of the pacemaker  on left upper chest. Incision well-healed. It is tender.  Heart: regular rate and rhythm, S1, S2 normal, no murmur, click, rub or gallop  Abdomen: soft, non-tender; bowel sounds normal; no masses, no organomegaly  Musculoskeletal: ROM normal in all joints, no crepitus, no deformity, Normal muscle strengthen. Back  is symmetric, no curvature. Skin: Skin color, texture, turgor normal. No rashes or lesions  Lymph nodes: Cervical, supraclavicular, and axillary nodes normal.  Neurologic: CN 2 -12 Normal, Normal symmetric reflexes. Normal coordination and gait  Psych: Alert & Oriented x 3, Mood appear stable.    Assessment:    Annual wellness medicare exam   Plan:    During the course of the visit the patient was educated and counseled about appropriate screening and preventive services including:   Recommend annual flu vaccine which was given in October  Has had Prevnar and pneumococcal 23 vaccines     Patient Instructions (the written plan) was given to the patient.  Medicare Attestation  I have personally reviewed:  The patient's medical and social history  Their use of alcohol, tobacco or illicit drugs  Their current medications and supplements  The patient's functional ability including ADLs,fall risks, home safety risks, cognitive, and hearing and visual impairment  Diet and physical activities  Evidence for depression or mood disorders  The patient's weight, height, BMI, and visual acuity have been recorded in the chart. I have made referrals, counseling, and provided education to the patient based on review of the above and I have provided the patient with a written personalized care plan for preventive services.

## 2016-05-13 NOTE — Patient Instructions (Signed)
Please quit drinking and go to AA. Return in 6 months to have platelet count checked. Finish taking Levaquin and prednisone for acute bronchitis. Continue other medications as previously prescribed. May take magnesium supplement for nocturnal leg cramps. Take Lasix sparingly for dependent edema.

## 2016-05-15 ENCOUNTER — Telehealth: Payer: Self-pay | Admitting: Internal Medicine

## 2016-05-15 ENCOUNTER — Encounter: Payer: Commercial Managed Care - HMO | Admitting: Internal Medicine

## 2016-05-15 NOTE — Telephone Encounter (Signed)
Patient called wanting to know if Dr. Renold Genta had any Celebrex for her and if not then just call her when she does.

## 2016-05-15 NOTE — Telephone Encounter (Signed)
I have it and will put it in themail

## 2016-08-03 ENCOUNTER — Other Ambulatory Visit: Payer: Self-pay

## 2016-08-03 MED ORDER — ALBUTEROL SULFATE HFA 108 (90 BASE) MCG/ACT IN AERS: 2.0000 | INHALATION_SPRAY | Freq: Four times a day (QID) | RESPIRATORY_TRACT | 99 refills | Status: DC | PRN

## 2016-08-03 NOTE — Telephone Encounter (Signed)
Pt called requesting a refill.

## 2016-10-06 DIAGNOSIS — H3562 Retinal hemorrhage, left eye: Secondary | ICD-10-CM | POA: Diagnosis not present

## 2016-11-26 IMAGING — CR DG CHEST 2V
2 series · 2 of 2 positions shown · non-contrast
Comparison: 12/20/2014.

CLINICAL DATA: Community acquired pneumonia for 17 days. The
concern for resolution. Patient states feels better.

EXAM:
CHEST  2 VIEW

[w chest pa]
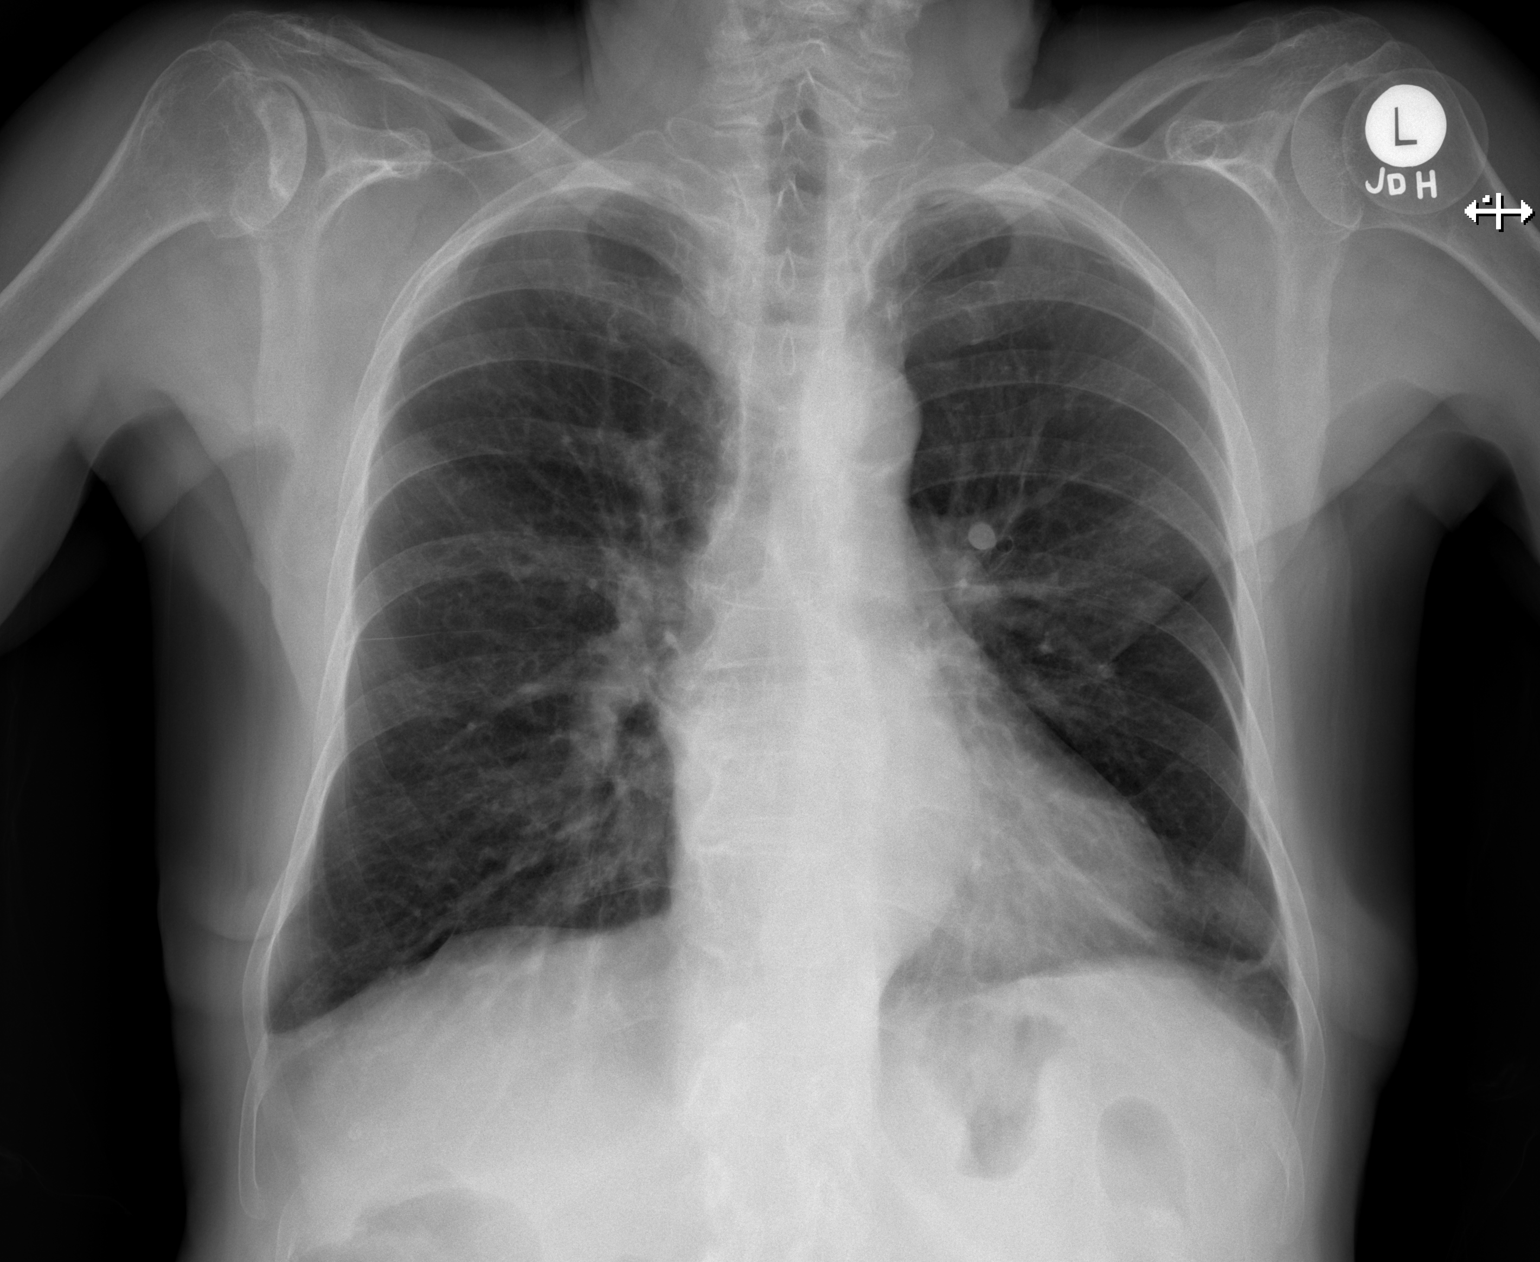

[w chest lat]
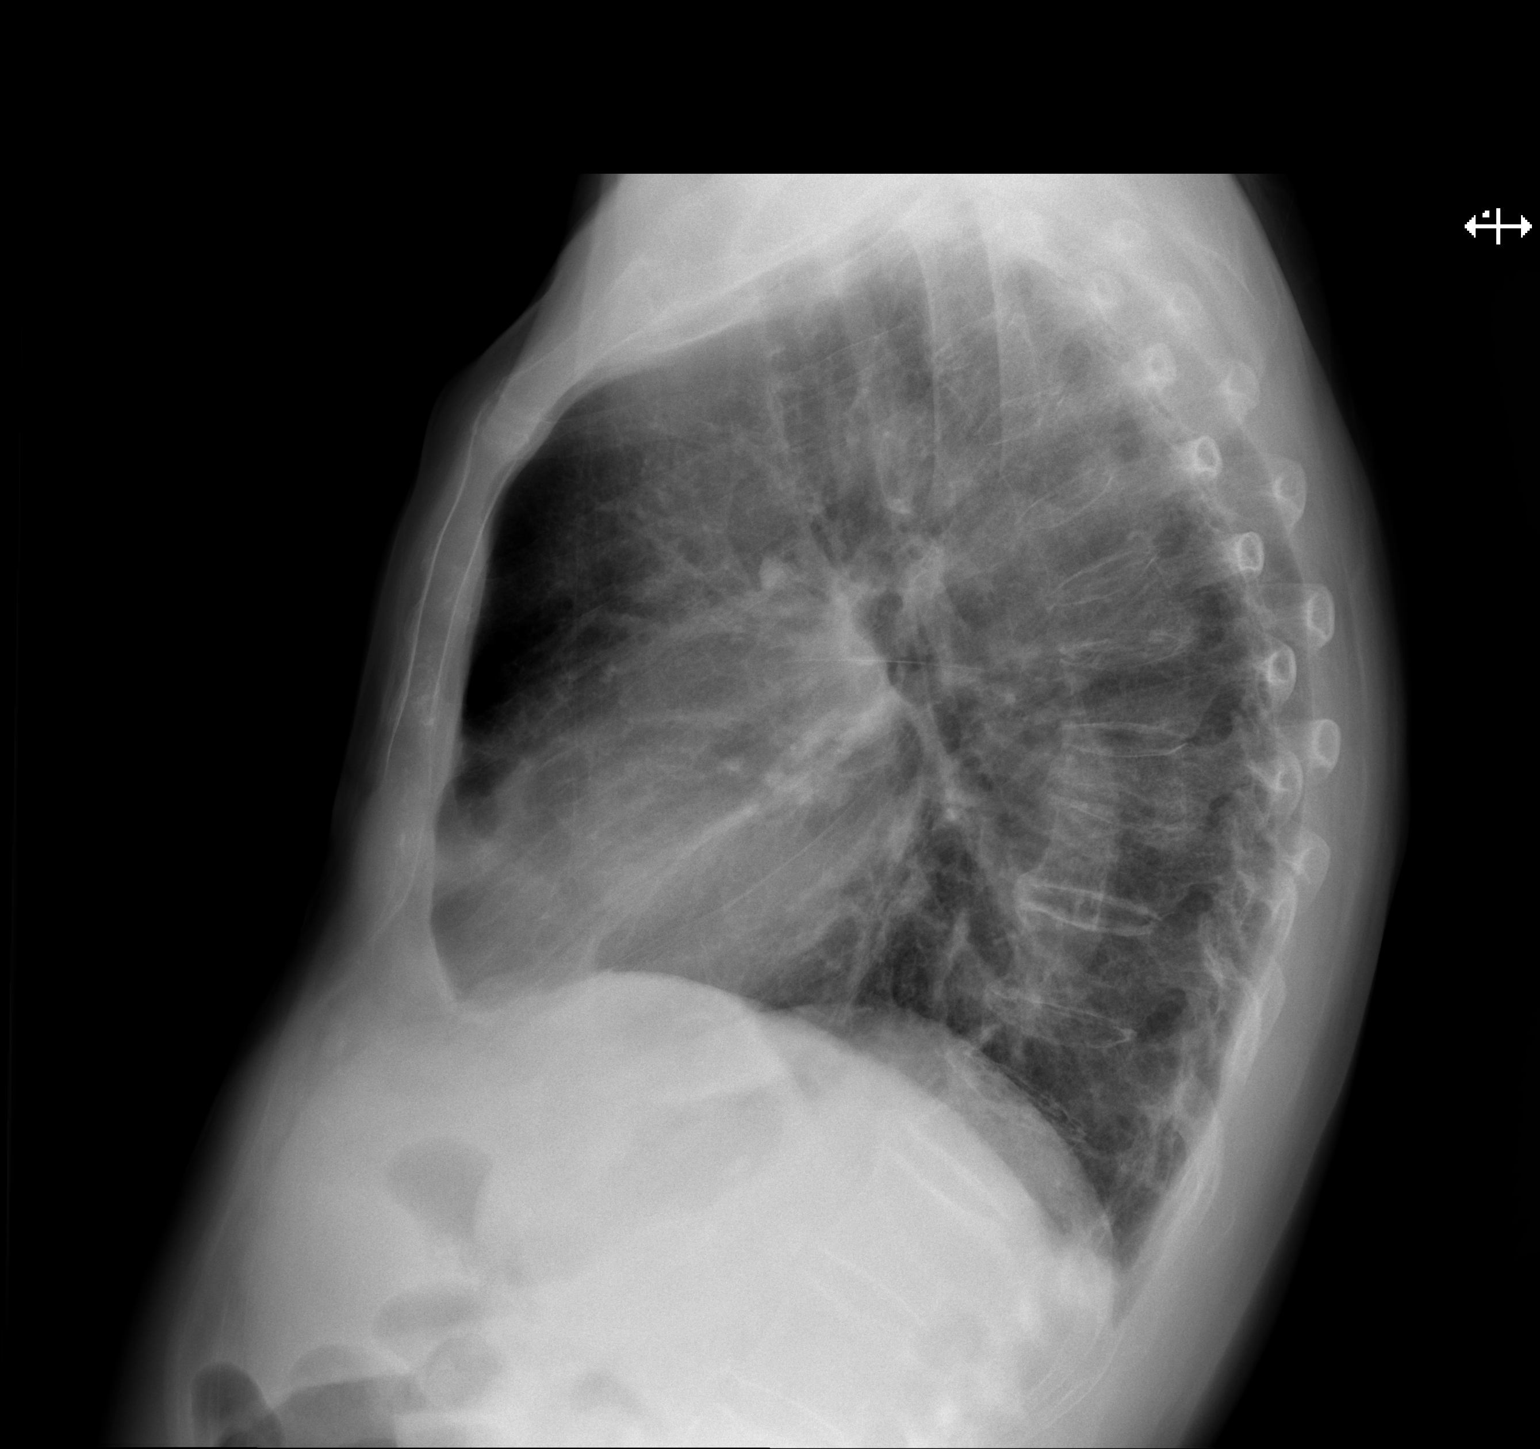

[2 of 2 positions shown; findings below may reference images not displayed]

FINDINGS: The heart size and mediastinal contours are within normal limits.
Both lungs are clear except for slight scarring at the LEFT base,
present as far back as 3189. Hyperinflation consistent with COPD. No
effusion or pneumothorax. Chronic osteopenic compression fractures
in the mid to lower thoracic spine, stable.
IMPRESSION: No active cardiopulmonary disease.

## 2016-12-02 ENCOUNTER — Telehealth: Payer: Self-pay

## 2016-12-02 NOTE — Telephone Encounter (Signed)
Documenting quality metrics

## 2016-12-11 ENCOUNTER — Telehealth: Payer: Self-pay

## 2016-12-11 ENCOUNTER — Encounter: Payer: Self-pay | Admitting: Internal Medicine

## 2016-12-11 ENCOUNTER — Ambulatory Visit: Payer: Commercial Managed Care - HMO | Admitting: Internal Medicine

## 2016-12-11 VITALS — BP 120/72 | HR 78 | Temp 98.1°F | Ht 60.25 in | Wt 113.0 lb

## 2016-12-11 DIAGNOSIS — Z0000 Encounter for general adult medical examination without abnormal findings: Secondary | ICD-10-CM

## 2016-12-11 DIAGNOSIS — Z853 Personal history of malignant neoplasm of breast: Secondary | ICD-10-CM

## 2016-12-11 DIAGNOSIS — M8580 Other specified disorders of bone density and structure, unspecified site: Secondary | ICD-10-CM

## 2016-12-11 DIAGNOSIS — R0989 Other specified symptoms and signs involving the circulatory and respiratory systems: Secondary | ICD-10-CM | POA: Diagnosis not present

## 2016-12-11 DIAGNOSIS — R609 Edema, unspecified: Secondary | ICD-10-CM | POA: Diagnosis not present

## 2016-12-11 DIAGNOSIS — Z87891 Personal history of nicotine dependence: Secondary | ICD-10-CM | POA: Diagnosis not present

## 2016-12-11 DIAGNOSIS — Z01818 Encounter for other preprocedural examination: Secondary | ICD-10-CM

## 2016-12-11 DIAGNOSIS — K769 Liver disease, unspecified: Secondary | ICD-10-CM | POA: Diagnosis not present

## 2016-12-11 DIAGNOSIS — K7460 Unspecified cirrhosis of liver: Secondary | ICD-10-CM | POA: Diagnosis not present

## 2016-12-11 DIAGNOSIS — R9431 Abnormal electrocardiogram [ECG] [EKG]: Secondary | ICD-10-CM | POA: Diagnosis not present

## 2016-12-11 LAB — CBC WITH DIFFERENTIAL/PLATELET
BASOS ABS: 48 {cells}/uL (ref 0–200)
Basophils Relative: 1 %
Eosinophils Absolute: 96 cells/uL (ref 15–500)
Eosinophils Relative: 2 %
HCT: 38.5 % (ref 35.0–45.0)
Hemoglobin: 13.3 g/dL (ref 11.7–15.5)
LYMPHS PCT: 23 %
Lymphs Abs: 1104 cells/uL (ref 850–3900)
MCH: 33.8 pg — AB (ref 27.0–33.0)
MCHC: 34.5 g/dL (ref 32.0–36.0)
MCV: 98 fL (ref 80.0–100.0)
MONOS PCT: 8 %
MPV: 9.3 fL (ref 7.5–12.5)
Monocytes Absolute: 384 cells/uL (ref 200–950)
NEUTROS PCT: 66 %
Neutro Abs: 3168 cells/uL (ref 1500–7800)
PLATELETS: 45 10*3/uL — AB (ref 140–400)
RBC: 3.93 MIL/uL (ref 3.80–5.10)
RDW: 15 % (ref 11.0–15.0)
WBC: 4.8 10*3/uL (ref 3.8–10.8)

## 2016-12-11 MED ORDER — FUROSEMIDE 20 MG PO TABS: 20.0000 mg | ORAL_TABLET | Freq: Every day | ORAL | 3 refills | Status: DC

## 2016-12-11 MED ORDER — CELECOXIB 200 MG PO CAPS: 200.0000 mg | ORAL_CAPSULE | Freq: Every day | ORAL | 0 refills | Status: DC

## 2016-12-11 NOTE — Progress Notes (Signed)
   Subjective:    Patient ID: Teresa Schneider, female    DOB: 05-17-1946, 71 y.o.   MRN: [id number]  HPI 71 year old Female for clearance for dental surgery.  She has a history of alcoholic liver disease followed by Dr. Cristina Gong. Liver functions are stable. Currently has no driver's license secondary to DUI. Alcoholic liver disease was diagnosed in spring of 2013. She did quit drinking 4. Of time after 31 day stay at [facility]. Continues to drink intermittently small amounts. Generally hires [place] for transportation.  Has had some mild swelling in the lower extremities with the hot weather. Is out of Lasix and this was refilled today.  Also wants some Celebrex for musculoskeletal pain. Was last given prescription in December 2017.  History of attention deficit disorder and has been on amphetamines for many years for that bursa contrast. She is also on trazodone for sleep and is also on Silenor. Sees Dr. Toy Care.  She has a history of bipolar disorder. History of osteopenia. She had ascites in 2013 secondary to alcoholic liver disease it resolved.  History of left breast carcinoma diagnosed in 2010. 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  History of hemachromatosis mutation.  Social history: She resides in a garage apartment in by Dr. Janet Berlin. History of depression since her mother died a number of years ago. No children. 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  Family history: Sr. died by suicide. Father died at age 69 of cancer. Mother died of dementia. One brother with history of congestive heart failure.      Review of Systems  Respiratory: Negative.   Cardiovascular: Positive for leg swelling. Negative for chest pain.  Genitourinary: Negative.   Musculoskeletal: Positive for arthralgias.       Objective:   Physical Exam Skin warm and dry. Nodes none. Neck is supple without JVD or  thyromegaly. She has bilateral carotid bruits. 2/6 systolic ejection murmur. Regular rate and rhythm. EKG shows occasional PACs and LVH. Extremities trace lower extremity edema. She is alert and oriented 3.       Assessment & Plan:  Dependent edema  Alcoholic liver disease  Hx palpitations but not recently- EKG shows LVH- order 2 D Echo  Bilateral carotid bruits-schedule carotid Dopplers  Plan: Lab work reviewed and shows findings consistent with alcoholic liver disease which appears to be stable from the last blood draw in December 2017. She has a history of hemachromatosis mutation. Check ferritin level.  Have refilled Lasix and Celebrex at her request to take sparingly.

## 2016-12-11 NOTE — Telephone Encounter (Signed)
Called about a foam that is for miracle relief of spasms and help prevent them as well.  It is called Theraworx and she heard about it from Dr. Dian Situ. She has ordered multiple bottles and said it works great. She wanted it to be documented that she uses this.

## 2016-12-12 LAB — COMPREHENSIVE METABOLIC PANEL
ALK PHOS: 125 U/L (ref 33–130)
ALT: 23 U/L (ref 6–29)
AST: 51 U/L — AB (ref 10–35)
Albumin: 3.7 g/dL (ref 3.6–5.1)
BILIRUBIN TOTAL: 2.6 mg/dL — AB (ref 0.2–1.2)
BUN: 15 mg/dL (ref 7–25)
CHLORIDE: 111 mmol/L — AB (ref 98–110)
CO2: 19 mmol/L — ABNORMAL LOW (ref 20–31)
CREATININE: 0.69 mg/dL (ref 0.50–0.99)
Calcium: 9.1 mg/dL (ref 8.6–10.4)
Glucose, Bld: 97 mg/dL (ref 65–99)
Potassium: 4.3 mmol/L (ref 3.5–5.3)
SODIUM: 143 mmol/L (ref 135–146)
TOTAL PROTEIN: 6.5 g/dL (ref 6.1–8.1)

## 2016-12-12 LAB — LIPID PANEL
CHOLESTEROL: 136 mg/dL (ref <200)
HDL: 57 mg/dL (ref >50)
LDL Cholesterol: 65 mg/dL (ref <100)
Total CHOL/HDL Ratio: 2.4 Ratio (ref <5.0)
Triglycerides: 72 mg/dL (ref <150)
VLDL: 14 mg/dL (ref <30)

## 2016-12-12 LAB — CP4508-PT/INR AND PTT
APTT: 30 s (ref 22–34)
INR: 1.2 — AB
Prothrombin Time: 12.8 s — ABNORMAL HIGH (ref 9.0–11.5)

## 2016-12-12 NOTE — Patient Instructions (Addendum)
She is to have 2-D echocardiogram and carotid Doppler studies. We will check ferritin level as she has a hemachromatosis mutation by history. She will not quit smoking. She is not anemic. PT and PTT are stable. She has occasional PACs on EKG but these are benign. Assuming cardiology studies pan out to be okay she can proceed with oral surgery

## 2016-12-16 ENCOUNTER — Ambulatory Visit (HOSPITAL_COMMUNITY)
Admission: RE | Admit: 2016-12-16 | Discharge: 2016-12-16 | Disposition: A | Discharge status: Home / Self Care | Payer: Medicare HMO | Source: Ambulatory Visit | Attending: Cardiovascular Disease | Admitting: Cardiovascular Disease

## 2016-12-16 ENCOUNTER — Ambulatory Visit (HOSPITAL_BASED_OUTPATIENT_CLINIC_OR_DEPARTMENT_OTHER): Payer: Medicare HMO

## 2016-12-16 ENCOUNTER — Other Ambulatory Visit: Payer: Self-pay

## 2016-12-16 DIAGNOSIS — R002 Palpitations: Secondary | ICD-10-CM | POA: Insufficient documentation

## 2016-12-16 DIAGNOSIS — Z720 Tobacco use: Secondary | ICD-10-CM | POA: Insufficient documentation

## 2016-12-16 DIAGNOSIS — R0989 Other specified symptoms and signs involving the circulatory and respiratory systems: Secondary | ICD-10-CM | POA: Diagnosis not present

## 2016-12-16 DIAGNOSIS — Z9013 Acquired absence of bilateral breasts and nipples: Secondary | ICD-10-CM | POA: Insufficient documentation

## 2016-12-16 DIAGNOSIS — G4733 Obstructive sleep apnea (adult) (pediatric): Secondary | ICD-10-CM | POA: Diagnosis not present

## 2016-12-16 DIAGNOSIS — I348 Other nonrheumatic mitral valve disorders: Secondary | ICD-10-CM | POA: Diagnosis not present

## 2016-12-16 DIAGNOSIS — R9431 Abnormal electrocardiogram [ECG] [EKG]: Secondary | ICD-10-CM | POA: Insufficient documentation

## 2016-12-16 DIAGNOSIS — K7010 Alcoholic hepatitis without ascites: Secondary | ICD-10-CM | POA: Diagnosis not present

## 2016-12-16 DIAGNOSIS — I358 Other nonrheumatic aortic valve disorders: Secondary | ICD-10-CM | POA: Insufficient documentation

## 2016-12-16 DIAGNOSIS — F1010 Alcohol abuse, uncomplicated: Secondary | ICD-10-CM | POA: Diagnosis not present

## 2016-12-16 DIAGNOSIS — I517 Cardiomegaly: Secondary | ICD-10-CM | POA: Diagnosis not present

## 2016-12-22 ENCOUNTER — Encounter: Payer: Self-pay | Admitting: Internal Medicine

## 2016-12-22 ENCOUNTER — Ambulatory Visit (INDEPENDENT_AMBULATORY_CARE_PROVIDER_SITE_OTHER): Payer: Medicare HMO | Admitting: Internal Medicine

## 2016-12-22 VITALS — BP 130/80 | HR 97 | Temp 98.5°F | Wt 112.0 lb

## 2016-12-22 DIAGNOSIS — I779 Disorder of arteries and arterioles, unspecified: Secondary | ICD-10-CM | POA: Diagnosis not present

## 2016-12-22 DIAGNOSIS — I517 Cardiomegaly: Secondary | ICD-10-CM

## 2016-12-22 DIAGNOSIS — I739 Peripheral vascular disease, unspecified: Secondary | ICD-10-CM

## 2016-12-22 DIAGNOSIS — D696 Thrombocytopenia, unspecified: Secondary | ICD-10-CM | POA: Insufficient documentation

## 2016-12-22 DIAGNOSIS — R0989 Other specified symptoms and signs involving the circulatory and respiratory systems: Secondary | ICD-10-CM

## 2016-12-22 DIAGNOSIS — K7030 Alcoholic cirrhosis of liver without ascites: Secondary | ICD-10-CM

## 2016-12-22 DIAGNOSIS — R600 Localized edema: Secondary | ICD-10-CM | POA: Diagnosis not present

## 2016-12-22 MED ORDER — ROSUVASTATIN CALCIUM 5 MG PO TABS: 5.0000 mg | ORAL_TABLET | Freq: Every day | ORAL | 3 refills | Status: DC

## 2016-12-22 MED ORDER — LOSARTAN POTASSIUM 25 MG PO TABS: 25.0000 mg | ORAL_TABLET | Freq: Every day | ORAL | 5 refills | Status: DC

## 2016-12-22 NOTE — Progress Notes (Signed)
   Subjective:    Patient ID: Teresa Schneider, female    DOB: February 14, 1946, 71 y.o.   MRN: [id number]  HPI 71 year old White Female long-term smoker recently found to have bilateral carotid bruits on physical exam and was sent for carotid Doppler studies. She is here today to discuss results. Results showed 40-59% bilateral carotid artery stenosis. She also had a 2-D echocardiogram showing trivial mitral regurgitation, mildly thickened leaflets of the mitral valve and a calcified annulus. LVH was confirmed. Right ventricle was mildly dilated. No aortic regurgitation or stenosis.    Review of Systems see above     Objective:   Physical Exam  Bilateral carotid bruits noted. Cardiac exam regular rate and rhythm. Extremities trace lower extremity edema.      Assessment & Plan:  Carotid artery disease  LVH-I'm starting her on losartan 25 mg daily  Trace lower extremity edema-may be related to dependent edema in hot weather. Would want to try Lasix 20 mg daily on a when necessary basis sparingly.  25 minutes spent with patient explaining these results

## 2016-12-24 ENCOUNTER — Telehealth: Payer: Self-pay

## 2016-12-24 MED ORDER — LOSARTAN POTASSIUM 25 MG PO TABS: 25.0000 mg | ORAL_TABLET | Freq: Every day | ORAL | 1 refills | Status: DC

## 2016-12-24 MED ORDER — FUROSEMIDE 20 MG PO TABS: ORAL_TABLET | ORAL | 0 refills | Status: DC

## 2016-12-24 MED ORDER — ROSUVASTATIN CALCIUM 5 MG PO TABS: 5.0000 mg | ORAL_TABLET | Freq: Every day | ORAL | 3 refills | Status: DC

## 2016-12-24 NOTE — Telephone Encounter (Signed)
Patient is new with [facility] and they needed new prescriptions sent to them in order to start processing her medications.

## 2017-01-04 ENCOUNTER — Ambulatory Visit (INDEPENDENT_AMBULATORY_CARE_PROVIDER_SITE_OTHER): Payer: Medicare HMO | Admitting: Internal Medicine

## 2017-01-04 ENCOUNTER — Encounter: Payer: Self-pay | Admitting: Internal Medicine

## 2017-01-04 ENCOUNTER — Telehealth: Payer: Self-pay

## 2017-01-04 VITALS — BP 146/70 | HR 83 | Temp 98.6°F | Ht 60.25 in | Wt 111.0 lb

## 2017-01-04 DIAGNOSIS — L237 Allergic contact dermatitis due to plants, except food: Secondary | ICD-10-CM | POA: Diagnosis not present

## 2017-01-04 MED ORDER — PREDNISONE 10 MG PO TABS: ORAL_TABLET | ORAL | 0 refills | Status: DC

## 2017-01-04 NOTE — Telephone Encounter (Signed)
Pt called states she worked in her yard and got poison oak she said she has used calamine cream and cortisone cream and it is not going away she would like to know if there's anything else that she can use?

## 2017-01-04 NOTE — Patient Instructions (Addendum)
Patient to use calamine lotion on rash 2-3 times daily. Take prednisone tapering course going from 50 mg to 0 mg over 6 days

## 2017-01-04 NOTE — Telephone Encounter (Signed)
Needs office visit today.

## 2017-01-04 NOTE — Progress Notes (Signed)
   Subjective:    Patient ID: Teresa Schneider, female    DOB: Apr 14, 1946, 71 y.o.   MRN: [id number]  HPI Patient has been doing a lot of gardening. Developed rash on her right arm a few days ago that is very itchy. Seems to be worse at night. Calamine lotion has not helped.    Review of Systems see above     Objective:   Physical Exam  She has linear lesions consistent with poison ivy on her right arm inner aspect. There are no vesicular lesions. Lesions are scattered along right forearm.      Assessment & Plan:  Contact dermatitis-likely poison ivy  Plan: Take prednisone in tapering course corn from 50 mg to 0 mg over 6 days as.5-4-3-2-1 taper. Take with food. Continue calamine lotion.

## 2017-01-10 NOTE — Patient Instructions (Signed)
Take Lasix 20 mg sparingly for edema. Start losartan 25 mg daily for LVH. Next year will  need carotid Doppler study.  Baby aspirin 81 mg daily with history of bilateral carotid disease.

## 2017-01-11 DIAGNOSIS — H3561 Retinal hemorrhage, right eye: Secondary | ICD-10-CM | POA: Diagnosis not present

## 2017-03-05 DIAGNOSIS — H43811 Vitreous degeneration, right eye: Secondary | ICD-10-CM | POA: Diagnosis not present

## 2017-03-22 ENCOUNTER — Telehealth: Payer: Self-pay | Admitting: Internal Medicine

## 2017-03-22 NOTE — Telephone Encounter (Signed)
See tomorrow

## 2017-03-22 NOTE — Telephone Encounter (Signed)
Appt made

## 2017-03-22 NOTE — Telephone Encounter (Signed)
States that she has had swelling in her ankles that started Sunday, 10/29.  It was bad enough that she could hardly walk.  The swelling went all the way up to her knees finally.  She found some old Lasix that she had.  About mid week she started taking 10mg  Lasix daily.  Also about midweek, she started with pain in her neck that was shooting down her arm.  Yesterday some of the pain even went down her leg.  Dr. Manus Rudd made her call here today.  States that the Lasix did help the swelling to go down.  However, today, they are swelling again, not near as bad as they had been in the past week, but they're beginning to start swelling again.    I told her I was sure she needed to be seen, but she was adamant that I just send the message to you first.

## 2017-03-23 ENCOUNTER — Ambulatory Visit: Payer: Medicare HMO | Admitting: Internal Medicine

## 2017-03-23 NOTE — Telephone Encounter (Addendum)
Spoke with patient Monday, 11/5 at 2:20 pm and advised that after reassessing her condition and doing some thinking, Dr. Renold Genta has decided that it would be best for her to go to the ED and be checked out.  This could be Liver failure.  Patient states that she really doesn't want to go and do that because it takes so long to sit there and wait.  After some discussion, she advised that when Dr. Manus Rudd gets home, she will ask her to take her to the ED.  She has an appointment scheduled for Tuesday at 3:45 with [place].  We will leave the appointment on the schedule in the event that we need to see her in follow up to the ED.  States that she has SOB with walking and talking, at the present time, no neck or arm pain.  Just took the Lasix about 30 minutes ago.  Still has some fluid in her legs.  Still says this is 10mg  of Lasix (asked her to check her bottle again for correct dosage); still says it is 10mg .

## 2017-03-23 NOTE — Telephone Encounter (Signed)
Patient didn't show for her appointment today, 11/6 @ 3:45.  She didn't go to the ED on Monday, 11/5 to be evaluated either.  We have not heard from the patient.

## 2017-03-26 DIAGNOSIS — H43813 Vitreous degeneration, bilateral: Secondary | ICD-10-CM | POA: Diagnosis not present

## 2017-05-07 ENCOUNTER — Encounter: Payer: Commercial Managed Care - HMO | Admitting: Internal Medicine

## 2017-05-07 ENCOUNTER — Other Ambulatory Visit: Payer: Commercial Managed Care - HMO | Admitting: Internal Medicine

## 2017-05-14 ENCOUNTER — Encounter: Payer: Commercial Managed Care - HMO | Admitting: Internal Medicine

## 2017-05-26 ENCOUNTER — Other Ambulatory Visit: Payer: Self-pay | Admitting: Internal Medicine

## 2017-05-26 DIAGNOSIS — Z0000 Encounter for general adult medical examination without abnormal findings: Secondary | ICD-10-CM

## 2017-05-26 DIAGNOSIS — Z1321 Encounter for screening for nutritional disorder: Secondary | ICD-10-CM

## 2017-05-26 DIAGNOSIS — Z13220 Encounter for screening for lipoid disorders: Secondary | ICD-10-CM

## 2017-05-26 DIAGNOSIS — Z1329 Encounter for screening for other suspected endocrine disorder: Secondary | ICD-10-CM

## 2017-06-01 ENCOUNTER — Other Ambulatory Visit: Payer: Self-pay | Admitting: Internal Medicine

## 2017-06-09 ENCOUNTER — Other Ambulatory Visit: Payer: Self-pay | Admitting: Internal Medicine

## 2017-06-09 ENCOUNTER — Telehealth: Payer: Self-pay | Admitting: Internal Medicine

## 2017-06-09 NOTE — Telephone Encounter (Signed)
Patient is coming in the morning for CPE Labs.  She is scheduled for CPE on Monday at 11a.m.  States she has been sick since sometime in December.  Wants to know if you would consider calling her in some Tessalon Perles.  They help her tremendously.  Since she is coming on Monday, she thought maybe she could get them prior to her visit so she could see if she was doing better prior to her visit on Monday.  She just has a hacking cough.  No fever, no drainage at this point, just the dry, hacking cough that is lingering and bothering her.    Pharmacy:  [facility]  Thank you.

## 2017-06-09 NOTE — Telephone Encounter (Signed)
Call in Tessalon perles 100 mg #30 for her one po tid prn cough

## 2017-06-10 ENCOUNTER — Other Ambulatory Visit: Payer: Medicare HMO | Admitting: Internal Medicine

## 2017-06-10 DIAGNOSIS — Z0000 Encounter for general adult medical examination without abnormal findings: Secondary | ICD-10-CM

## 2017-06-10 DIAGNOSIS — Z1321 Encounter for screening for nutritional disorder: Secondary | ICD-10-CM

## 2017-06-10 DIAGNOSIS — Z1329 Encounter for screening for other suspected endocrine disorder: Secondary | ICD-10-CM | POA: Diagnosis not present

## 2017-06-10 DIAGNOSIS — Z13220 Encounter for screening for lipoid disorders: Secondary | ICD-10-CM

## 2017-06-11 LAB — COMPLETE METABOLIC PANEL WITH GFR
AG RATIO: 1.4 (calc) (ref 1.0–2.5)
ALBUMIN MSPROF: 3.6 g/dL (ref 3.6–5.1)
ALT: 26 U/L (ref 6–29)
AST: 51 U/L — ABNORMAL HIGH (ref 10–35)
Alkaline phosphatase (APISO): 102 U/L (ref 33–130)
BILIRUBIN TOTAL: 2.5 mg/dL — AB (ref 0.2–1.2)
BUN: 21 mg/dL (ref 7–25)
CALCIUM: 9.9 mg/dL (ref 8.6–10.4)
CHLORIDE: 110 mmol/L (ref 98–110)
CO2: 22 mmol/L (ref 20–32)
Creat: 0.72 mg/dL (ref 0.60–0.93)
GFR, EST AFRICAN AMERICAN: 98 mL/min/{1.73_m2} (ref >=60)
GFR, EST NON AFRICAN AMERICAN: 85 mL/min/{1.73_m2} (ref >=60)
Globulin: 2.6 g/dL (calc) (ref 1.9–3.7)
Glucose, Bld: 92 mg/dL (ref 65–99)
POTASSIUM: 4 mmol/L (ref 3.5–5.3)
SODIUM: 140 mmol/L (ref 135–146)
TOTAL PROTEIN: 6.2 g/dL (ref 6.1–8.1)

## 2017-06-11 LAB — CBC WITH DIFFERENTIAL/PLATELET
BASOS ABS: 63 {cells}/uL (ref 0–200)
Basophils Relative: 1 %
EOS ABS: 208 {cells}/uL (ref 15–500)
EOS PCT: 3.3 %
HCT: 30.9 % — ABNORMAL LOW (ref 35.0–45.0)
Hemoglobin: 10.8 g/dL — ABNORMAL LOW (ref 11.7–15.5)
Lymphs Abs: 1594 cells/uL (ref 850–3900)
MCH: 32.5 pg (ref 27.0–33.0)
MCHC: 35 g/dL (ref 32.0–36.0)
MCV: 93.1 fL (ref 80.0–100.0)
MONOS PCT: 10.5 %
MPV: 9.2 fL (ref 7.5–12.5)
NEUTROS ABS: 3774 {cells}/uL (ref 1500–7800)
Neutrophils Relative %: 59.9 %
PLATELETS: 64 10*3/uL — AB (ref 140–400)
RBC: 3.32 10*6/uL — ABNORMAL LOW (ref 3.80–5.10)
RDW: 13.9 % (ref 11.0–15.0)
TOTAL LYMPHOCYTE: 25.3 %
WBC mixed population: 662 cells/uL (ref 200–950)
WBC: 6.3 10*3/uL (ref 3.8–10.8)

## 2017-06-11 LAB — LIPID PANEL
CHOLESTEROL: 113 mg/dL (ref <200)
HDL: 56 mg/dL (ref >50)
LDL Cholesterol (Calc): 42 mg/dL (calc)
NON-HDL CHOLESTEROL (CALC): 57 mg/dL (ref <130)
TRIGLYCERIDES: 71 mg/dL (ref <150)
Total CHOL/HDL Ratio: 2 (calc) (ref <5.0)

## 2017-06-11 LAB — TSH: TSH: 1.08 mIU/L (ref 0.40–4.50)

## 2017-06-11 LAB — VITAMIN D 25 HYDROXY (VIT D DEFICIENCY, FRACTURES): Vit D, 25-Hydroxy: 35 ng/mL (ref 30–100)

## 2017-06-14 ENCOUNTER — Encounter: Payer: Self-pay | Admitting: Internal Medicine

## 2017-06-14 ENCOUNTER — Ambulatory Visit: Payer: Medicare HMO | Admitting: Internal Medicine

## 2017-06-14 VITALS — BP 110/80 | HR 84 | Ht 60.75 in | Wt 120.0 lb

## 2017-06-14 DIAGNOSIS — D696 Thrombocytopenia, unspecified: Secondary | ICD-10-CM | POA: Diagnosis not present

## 2017-06-14 DIAGNOSIS — J069 Acute upper respiratory infection, unspecified: Secondary | ICD-10-CM | POA: Diagnosis not present

## 2017-06-14 DIAGNOSIS — F1010 Alcohol abuse, uncomplicated: Secondary | ICD-10-CM | POA: Diagnosis not present

## 2017-06-14 DIAGNOSIS — R8290 Unspecified abnormal findings in urine: Secondary | ICD-10-CM

## 2017-06-14 DIAGNOSIS — K7030 Alcoholic cirrhosis of liver without ascites: Secondary | ICD-10-CM

## 2017-06-14 DIAGNOSIS — Z87891 Personal history of nicotine dependence: Secondary | ICD-10-CM | POA: Diagnosis not present

## 2017-06-14 DIAGNOSIS — R0989 Other specified symptoms and signs involving the circulatory and respiratory systems: Secondary | ICD-10-CM | POA: Diagnosis not present

## 2017-06-14 DIAGNOSIS — R5383 Other fatigue: Secondary | ICD-10-CM | POA: Diagnosis not present

## 2017-06-14 DIAGNOSIS — F909 Attention-deficit hyperactivity disorder, unspecified type: Secondary | ICD-10-CM

## 2017-06-14 DIAGNOSIS — Z8659 Personal history of other mental and behavioral disorders: Secondary | ICD-10-CM

## 2017-06-14 DIAGNOSIS — Z0000 Encounter for general adult medical examination without abnormal findings: Secondary | ICD-10-CM

## 2017-06-14 DIAGNOSIS — M8580 Other specified disorders of bone density and structure, unspecified site: Secondary | ICD-10-CM

## 2017-06-14 DIAGNOSIS — J0100 Acute maxillary sinusitis, unspecified: Secondary | ICD-10-CM

## 2017-06-14 DIAGNOSIS — R17 Unspecified jaundice: Secondary | ICD-10-CM | POA: Diagnosis not present

## 2017-06-14 DIAGNOSIS — D649 Anemia, unspecified: Secondary | ICD-10-CM

## 2017-06-14 DIAGNOSIS — I517 Cardiomegaly: Secondary | ICD-10-CM

## 2017-06-14 DIAGNOSIS — Z853 Personal history of malignant neoplasm of breast: Secondary | ICD-10-CM

## 2017-06-14 LAB — POCT URINALYSIS DIPSTICK
APPEARANCE: NORMAL
BILIRUBIN UA: NEGATIVE
Glucose, UA: NEGATIVE
Ketones, UA: NEGATIVE
NITRITE UA: NEGATIVE
ODOR: NORMAL
PH UA: 6 (ref 5.0–8.0)
PROTEIN UA: NEGATIVE
RBC UA: NEGATIVE
Spec Grav, UA: 1.01 (ref 1.010–1.025)
UROBILINOGEN UA: 0.2 U/dL

## 2017-06-14 MED ORDER — LEVOFLOXACIN 500 MG PO TABS: 500.0000 mg | ORAL_TABLET | Freq: Every day | ORAL | 0 refills | Status: DC

## 2017-06-14 MED ORDER — AMOXICILLIN-POT CLAVULANATE 500-125 MG PO TABS: 1.0000 | ORAL_TABLET | Freq: Three times a day (TID) | ORAL | 0 refills | Status: DC

## 2017-06-14 MED ORDER — MUPIROCIN 2 % EX OINT: 1.0000 "application " | TOPICAL_OINTMENT | Freq: Two times a day (BID) | CUTANEOUS | 0 refills | Status: DC

## 2017-06-14 MED ORDER — METHYLPREDNISOLONE ACETATE 80 MG/ML IJ SUSP
80.0000 mg | Freq: Once | INTRAMUSCULAR | Status: AC
  Administered 2017-06-14: 80 mg via INTRAMUSCULAR

## 2017-06-14 NOTE — Progress Notes (Signed)
Subjective:    Patient ID: Teresa Schneider, female    DOB: 1946/01/19, 72 y.o.   MRN: [id number]  HPI 72 year old White Female in today for health maintenance exam, Medicare wellness and evaluation of medical issues.  History of alcoholic liver disease with cirrhosis.  This was diagnosed in 2013 and she has seen Dr. Cristina Gong.  Continues to drink very small amount of alcohol on a daily basis but she did quit for a while during Christmas.  History of ascites in 2013 that resolved.  History of left breast carcinoma diagnosed 2010.  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  History of hemochromatosis mutation.  Social history: She resides in a garage apartment owned by Dr. Janet Berlin.  No children.  She is smoked for well over 30 years.  Has been depressed since her mother died a number of years ago.  Sister committed suicide.  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  Family history: Sister died by suicide.  Father died at age 59 of cancer.  Mother died of dementia.  One brother with history of congestive heart failure.  History of dependent edema treated with Lasix  Has been on Silenor by Dr. Toy Care but apparently it is very expensive.  She has some questions about some of her other medications.  She has a history of bipolar disorder.  She is willing to see another psychiatrist for evaluation.  History of attention deficit disorder.  She takes gabapentin 3 times a day.  History of nocturnal leg cramping.  Previously treated with magnesium supplement.  Has noticed issues with nosebleed.  Is taking aspirin with history of carotid bruits.  Has thrombocytopenia from alcoholic liver disease.  May need to cut back on aspirin consumption.  Complaining of respiratory congestion and postnasal drip.  History of smoking.  Complaining of sore in her nose and have prescribed Bactroban  ointment.    Review of Systems  Constitutional: Positive for fatigue.  HENT: Positive for nosebleeds.   Respiratory:       Postnasal drip and congestion  Cardiovascular: Positive for chest pain.  Genitourinary: Negative.        Objective:   Physical Exam  Constitutional: She is oriented to person, place, and time. No distress.  HENT:  Head: Normocephalic and atraumatic.  Right TM is full.  Pharynx is clear.  Neck: Neck supple. No JVD present. No thyromegaly present.  Bilateral carotid bruits  Cardiovascular: Normal rate, regular rhythm, normal heart sounds and intact distal pulses.  Pulmonary/Chest: No respiratory distress. She has no wheezes. She has no rales.  Bilateral mastectomies  Abdominal: She exhibits no mass. There is no tenderness. There is no rebound and no guarding.  Slightly distended  Genitourinary:  Genitourinary Comments: deferred  Musculoskeletal: She exhibits no edema.  Lymphadenopathy:    She has no cervical adenopathy.  Neurological: She is alert and oriented to person, place, and time. No cranial nerve deficit. Coordination normal.  Skin: Skin is warm and dry. No rash noted. She is not diaphoretic.  Psychiatric: She has a normal mood and affect. Her behavior is normal. Thought content normal.  Vitals reviewed.         Assessment & Plan:  Acute maxillary sinusitis-given Depo-Medrol 80 mg IM and prescribe Levaquin 500 mg  a day for 10 days.  Anemia-anemia studies drawn  Alcoholic liver disease-has elevated bilirubin which is stable  History of bipolar disorder-we will try to get her another psychiatry opinion regarding her medications and perhaps a change from [place] to something more affordable.  Attention deficit disorder.  She has to write notes and advance to discuss at time of physical exam.  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  May need to cut back on aspirin for short period of time.  Anemia-needs anemia studies-these were drawn today  History of allergic rhinitis  Plan: Anemia lab work needs to be with further recommendations to follow.  Levaquin 500 mg daily for 10 days for acute maxillary sinusitis.  Subjective:   Patient presents for Medicare Annual/Subsequent preventive examination.  Review Past Medical/Family/Social: See above   Risk Factors  Current exercise habits: Walks some Dietary issues discussed: Low-fat low carbohydrate  Cardiac risk factors: History of smoking  Depression Screen  (Note: if answer to either of the following is "Yes", a more complete depression screening is indicated)   Over the past two weeks, have you felt down, depressed or hopeless? No  Over the past two weeks, have you felt little interest or pleasure in doing things? Yes since I have been sick with post nasal drip and congestion this is Have you lost interest or pleasure in daily life? No Do you often feel hopeless? No Do you cry easily over simple problems? No   Activities of Daily Living  In your present state of health, do you have any difficulty performing the following activities?:   Driving? No  Managing money? Yes but likely due to attention deficit and bipolar disorder Feeding yourself? No  Getting from bed to chair? No  Climbing a flight of stairs? No  Preparing food and eating?: No  Bathing or showering? No  Getting dressed: No  Getting to the toilet? No  Using the toilet:No  Moving around from place to place: No  In the past year have you fallen or had a near fall?:yes Are you sexually active? No  Do you have more than one partner? No   Hearing Difficulties: No  Do you often ask people to speak up or repeat themselves? No  Do you experience  ringing or noises in your ears? No  Do you have difficulty understanding soft or whispered voices? yes Do you feel that you have a problem with memory? No Do you often misplace items? No    Home Safety:  Do you have a smoke alarm at your residence? Yes Do you have grab bars in the bathroom? No Do you have throw rugs in your house?  No   Cognitive Testing  Alert? Yes Normal Appearance?Yes  Oriented to person? Yes Place? Yes  Time? Yes  Recall of three objects? Yes  Can perform simple calculations? Yes  Displays appropriate judgment?Yes  Can read the correct time from a watch face?Yes   List the Names of Other Physician/Practitioners you currently use:  See referral list for the physicians patient is currently seeing.     Review of Systems: See above   Objective:     General appearance: Appears stated age Head: Normocephalic, without obvious abnormality, atraumatic  Eyes: conj clear, EOMi PEERLA  Ears: normal TM's and external ear canals both ears  Nose: Nares normal. Septum midline. Mucosa normal. No drainage or sinus  tenderness.  Throat: lips, mucosa, and tongue normal; teeth and gums normal  Neck: no adenopathy, no carotid bruit, no JVD, supple, symmetrical, trachea midline and thyroid not enlarged, symmetric, no tenderness/mass/nodules  No CVA tenderness.  Lungs: clear to auscultation bilaterally  Breasts: normal appearance, no masses or tenderness Heart: regular rate and rhythm, S1, S2 normal, no murmur, click, rub or gallop  Abdomen: soft, non-tender; bowel sounds normal; no masses, no organomegaly  Musculoskeletal: ROM normal in all joints, no crepitus, no deformity, Normal muscle strengthen. Back  is symmetric, no curvature. Skin: Skin color, texture, turgor normal. No rashes or lesions  Lymph nodes: Cervical, supraclavicular, and axillary nodes normal.  Neurologic: CN 2 -12 Normal, Normal symmetric reflexes. Normal coordination and gait  Psych: Alert & Oriented  x 3, Mood appear stable.    Assessment:    Annual wellness medicare exam   Plan:    During the course of the visit the patient was educated and counseled about appropriate screening and preventive services including:   Annual flu vaccine     Patient Instructions (the written plan) was given to the patient.  Medicare Attestation  I have personally reviewed:  The patient's medical and social history  Their use of alcohol, tobacco or illicit drugs  Their current medications and supplements  The patient's functional ability including ADLs,fall risks, home safety risks, cognitive, and hearing and visual impairment  Diet and physical activities  Evidence for depression or mood disorders  The patient's weight, height, BMI, and visual acuity have been recorded in the chart. I have made referrals, counseling, and provided education to the patient based on review of the above and I have provided the patient with a written personalized care plan for preventive services.

## 2017-06-14 NOTE — Patient Instructions (Signed)
Take Levaquin as directed.  Depo-Medrol given IM for congestion.  Anemia studies drawn today.  Please stop drinking.  Follow-up in 6 months.  We will try to find another psychiatrist for opinion regarding your medications.

## 2017-06-15 LAB — VITAMIN B12: Vitamin B-12: 1671 pg/mL — ABNORMAL HIGH (ref 200–1100)

## 2017-06-15 LAB — IRON,TIBC AND FERRITIN PANEL
%SAT: 37 % (calc) (ref 11–50)
Ferritin: 207 ng/mL (ref 20–288)
Iron: 103 ug/dL (ref 45–160)
TIBC: 282 mcg/dL (calc) (ref 250–450)

## 2017-06-15 LAB — URINE CULTURE
MICRO NUMBER:: 90115904
SPECIMEN QUALITY: ADEQUATE

## 2017-06-15 LAB — FOLATE: Folate: 14.5 ng/mL

## 2017-06-29 ENCOUNTER — Ambulatory Visit: Payer: Medicare HMO | Admitting: Internal Medicine

## 2017-07-06 ENCOUNTER — Ambulatory Visit: Payer: Medicare HMO | Admitting: Internal Medicine

## 2017-07-07 ENCOUNTER — Ambulatory Visit (INDEPENDENT_AMBULATORY_CARE_PROVIDER_SITE_OTHER): Payer: Medicare HMO | Admitting: Internal Medicine

## 2017-07-07 DIAGNOSIS — M810 Age-related osteoporosis without current pathological fracture: Secondary | ICD-10-CM

## 2017-07-07 DIAGNOSIS — Z23 Encounter for immunization: Secondary | ICD-10-CM

## 2017-07-07 DIAGNOSIS — E2839 Other primary ovarian failure: Secondary | ICD-10-CM

## 2017-07-07 DIAGNOSIS — R040 Epistaxis: Secondary | ICD-10-CM | POA: Diagnosis not present

## 2017-07-07 DIAGNOSIS — Z87891 Personal history of nicotine dependence: Secondary | ICD-10-CM | POA: Diagnosis not present

## 2017-07-07 DIAGNOSIS — K7030 Alcoholic cirrhosis of liver without ascites: Secondary | ICD-10-CM | POA: Diagnosis not present

## 2017-07-07 NOTE — Addendum Note (Signed)
Addended by: Mady Haagensen on: 07/07/2017 12:39 PM   Modules accepted: Orders

## 2017-07-07 NOTE — Patient Instructions (Addendum)
Pneumococcal 23 vaccine given.  Bone density study ordered.

## 2017-07-07 NOTE — Progress Notes (Signed)
   Subjective:    Patient ID: Teresa Schneider, female    DOB: 1946-01-08, 72 y.o.   MRN: [id number]  HPI She came in today for pneumococcal 23 and a nurse visit.  She mentioned she had been diagnosed with osteoporosis.  A bone density study was ordered by Dr. Dorothyann Gibbs who has since retired in June 2016.  Apparently she was not placed on any treatment.  She will need another bone density study in the near future.  She was asking about taking Prolia but I want to see another bone density study. Bone density study in the left femoral neck showed a T score of -2.9 in 2016.  Left forearm was -1.3 in the LS spine was -1.4.  She also had a nosebleed last night.  She is wondering about that.  I am certainly glad to refer her to ENT physician but she is going to have nosebleeds likely from time to time with alcoholic liver disease.  These are best treated by pinching the nostrils for 10 minutes tightly.  She is been told this before.   Review of Systems     Objective:   Physical Exam        Assessment & Plan:

## 2017-08-23 ENCOUNTER — Ambulatory Visit
Admission: RE | Admit: 2017-08-23 | Discharge: 2017-08-23 | Disposition: A | Discharge status: Home / Self Care | Payer: Medicare HMO | Source: Ambulatory Visit | Attending: Internal Medicine | Admitting: Internal Medicine

## 2017-08-23 DIAGNOSIS — E2839 Other primary ovarian failure: Secondary | ICD-10-CM

## 2017-08-23 DIAGNOSIS — M810 Age-related osteoporosis without current pathological fracture: Secondary | ICD-10-CM | POA: Diagnosis not present

## 2017-08-23 DIAGNOSIS — Z780 Asymptomatic menopausal state: Secondary | ICD-10-CM | POA: Diagnosis not present

## 2017-08-25 ENCOUNTER — Telehealth: Payer: Self-pay | Admitting: Internal Medicine

## 2017-08-25 ENCOUNTER — Encounter: Payer: Self-pay | Admitting: Internal Medicine

## 2017-08-25 MED ORDER — IBANDRONATE SODIUM 150 MG PO TABS: ORAL_TABLET | ORAL | 11 refills | Status: DC

## 2017-08-25 NOTE — Telephone Encounter (Signed)
E scribed Boniva 150 mg monthly

## 2017-09-21 ENCOUNTER — Telehealth: Payer: Self-pay | Admitting: Internal Medicine

## 2017-09-21 NOTE — Telephone Encounter (Signed)
Teresa Schneider Self [phone]  [name] called stating that she has been taking 1 1/2 tizanidine 4 mg at night for the last week and she has been averaging 8 plus hours of sleep. This is the best she has sleep in 3 years and she is wanting to no if this is okay to continue taking. No other sleep medicine has worked and she was so tired of only sleeping a couple hours at a time.

## 2017-09-21 NOTE — Telephone Encounter (Signed)
She should check with whomever prescribes her Dexedrine.

## 2017-09-22 NOTE — Telephone Encounter (Signed)
Called Teresa Schneider back and let her know to call the person who prescribes her Dexedrine and ask these questions.

## 2017-09-24 DIAGNOSIS — H43813 Vitreous degeneration, bilateral: Secondary | ICD-10-CM | POA: Diagnosis not present

## 2017-09-24 DIAGNOSIS — H04123 Dry eye syndrome of bilateral lacrimal glands: Secondary | ICD-10-CM | POA: Diagnosis not present

## 2017-09-30 ENCOUNTER — Telehealth: Payer: Self-pay | Admitting: Internal Medicine

## 2017-09-30 MED ORDER — IBANDRONATE SODIUM 150 MG PO TABS: ORAL_TABLET | ORAL | 3 refills | Status: AC

## 2017-09-30 NOTE — Telephone Encounter (Addendum)
Tatum Corl Self [phone]  [facility] - [place]   Oluwaseun called to say she needs a refill for her Bone Density medicine and she would like to get a 3 month supply instead of 1 month

## 2017-09-30 NOTE — Addendum Note (Signed)
Addended by: Mady Haagensen on: 09/30/2017 11:47 AM   Modules accepted: Orders

## 2017-09-30 NOTE — Telephone Encounter (Signed)
This has been done and sent to pharmacy

## 2017-10-15 DIAGNOSIS — L814 Other melanin hyperpigmentation: Secondary | ICD-10-CM | POA: Diagnosis not present

## 2017-10-15 DIAGNOSIS — Z85828 Personal history of other malignant neoplasm of skin: Secondary | ICD-10-CM | POA: Diagnosis not present

## 2017-10-15 DIAGNOSIS — D692 Other nonthrombocytopenic purpura: Secondary | ICD-10-CM | POA: Diagnosis not present

## 2017-10-15 DIAGNOSIS — L820 Inflamed seborrheic keratosis: Secondary | ICD-10-CM | POA: Diagnosis not present

## 2017-10-15 DIAGNOSIS — B078 Other viral warts: Secondary | ICD-10-CM | POA: Diagnosis not present

## 2017-10-15 DIAGNOSIS — D225 Melanocytic nevi of trunk: Secondary | ICD-10-CM | POA: Diagnosis not present

## 2017-10-15 DIAGNOSIS — L821 Other seborrheic keratosis: Secondary | ICD-10-CM | POA: Diagnosis not present

## 2017-11-05 ENCOUNTER — Telehealth: Payer: Self-pay

## 2017-11-05 NOTE — Telephone Encounter (Signed)
Called to schedule a 6 month follow up. VM machine was full and I could not leave a message.

## 2017-11-08 ENCOUNTER — Other Ambulatory Visit: Payer: Self-pay | Admitting: Internal Medicine

## 2017-11-24 ENCOUNTER — Other Ambulatory Visit: Payer: Self-pay | Admitting: Internal Medicine

## 2017-11-24 DIAGNOSIS — F1010 Alcohol abuse, uncomplicated: Secondary | ICD-10-CM

## 2017-11-24 DIAGNOSIS — D696 Thrombocytopenia, unspecified: Secondary | ICD-10-CM

## 2017-11-24 DIAGNOSIS — R17 Unspecified jaundice: Secondary | ICD-10-CM

## 2017-11-24 DIAGNOSIS — D649 Anemia, unspecified: Secondary | ICD-10-CM

## 2017-11-24 DIAGNOSIS — K7030 Alcoholic cirrhosis of liver without ascites: Secondary | ICD-10-CM

## 2017-11-24 DIAGNOSIS — R0989 Other specified symptoms and signs involving the circulatory and respiratory systems: Secondary | ICD-10-CM

## 2017-11-26 ENCOUNTER — Telehealth: Payer: Self-pay | Admitting: Internal Medicine

## 2017-11-26 NOTE — Telephone Encounter (Addendum)
Faxed Medical records for 05-18-16 till present to [facility] (29 pages) on 7.12.19 @12 ;52 pm

## 2017-11-29 ENCOUNTER — Other Ambulatory Visit: Payer: Medicare HMO | Admitting: Internal Medicine

## 2017-11-29 DIAGNOSIS — D696 Thrombocytopenia, unspecified: Secondary | ICD-10-CM

## 2017-11-29 DIAGNOSIS — R0989 Other specified symptoms and signs involving the circulatory and respiratory systems: Secondary | ICD-10-CM

## 2017-11-29 DIAGNOSIS — R6889 Other general symptoms and signs: Secondary | ICD-10-CM | POA: Diagnosis not present

## 2017-11-29 DIAGNOSIS — Z716 Tobacco abuse counseling: Secondary | ICD-10-CM | POA: Diagnosis not present

## 2017-11-29 DIAGNOSIS — F1010 Alcohol abuse, uncomplicated: Secondary | ICD-10-CM | POA: Diagnosis not present

## 2017-11-29 DIAGNOSIS — K7030 Alcoholic cirrhosis of liver without ascites: Secondary | ICD-10-CM

## 2017-11-29 DIAGNOSIS — I779 Disorder of arteries and arterioles, unspecified: Secondary | ICD-10-CM | POA: Diagnosis not present

## 2017-11-29 DIAGNOSIS — D649 Anemia, unspecified: Secondary | ICD-10-CM | POA: Diagnosis not present

## 2017-11-29 DIAGNOSIS — R17 Unspecified jaundice: Secondary | ICD-10-CM

## 2017-11-29 DIAGNOSIS — L237 Allergic contact dermatitis due to plants, except food: Secondary | ICD-10-CM | POA: Diagnosis not present

## 2017-11-29 DIAGNOSIS — K709 Alcoholic liver disease, unspecified: Secondary | ICD-10-CM | POA: Diagnosis not present

## 2017-11-29 LAB — COMPLETE METABOLIC PANEL WITH GFR
AG Ratio: 1.4 (calc) (ref 1.0–2.5)
ALKALINE PHOSPHATASE (APISO): 91 U/L (ref 33–130)
ALT: 23 U/L (ref 6–29)
AST: 55 U/L — AB (ref 10–35)
Albumin: 3.4 g/dL — ABNORMAL LOW (ref 3.6–5.1)
BUN: 21 mg/dL (ref 7–25)
CALCIUM: 11.2 mg/dL — AB (ref 8.6–10.4)
CO2: 28 mmol/L (ref 20–32)
Chloride: 108 mmol/L (ref 98–110)
Creat: 0.9 mg/dL (ref 0.60–0.93)
GFR, Est African American: 75 mL/min/{1.73_m2} (ref >=60)
GFR, Est Non African American: 65 mL/min/{1.73_m2} (ref >=60)
GLOBULIN: 2.4 g/dL (ref 1.9–3.7)
GLUCOSE: 104 mg/dL — AB (ref 65–99)
POTASSIUM: 3.7 mmol/L (ref 3.5–5.3)
SODIUM: 141 mmol/L (ref 135–146)
Total Bilirubin: 1.6 mg/dL — ABNORMAL HIGH (ref 0.2–1.2)
Total Protein: 5.8 g/dL — ABNORMAL LOW (ref 6.1–8.1)

## 2017-11-29 LAB — LIPID PANEL
CHOL/HDL RATIO: 2.8 (calc) (ref <5.0)
CHOLESTEROL: 122 mg/dL (ref <200)
HDL: 44 mg/dL — AB (ref >50)
LDL Cholesterol (Calc): 59 mg/dL (calc)
Non-HDL Cholesterol (Calc): 78 mg/dL (calc) (ref <130)
Triglycerides: 100 mg/dL (ref <150)

## 2017-11-30 ENCOUNTER — Ambulatory Visit: Payer: Medicare HMO | Admitting: Internal Medicine

## 2017-11-30 ENCOUNTER — Encounter: Payer: Self-pay | Admitting: Internal Medicine

## 2017-11-30 DIAGNOSIS — L237 Allergic contact dermatitis due to plants, except food: Secondary | ICD-10-CM | POA: Diagnosis not present

## 2017-11-30 DIAGNOSIS — I779 Disorder of arteries and arterioles, unspecified: Secondary | ICD-10-CM

## 2017-11-30 DIAGNOSIS — Z716 Tobacco abuse counseling: Secondary | ICD-10-CM

## 2017-11-30 DIAGNOSIS — R6889 Other general symptoms and signs: Secondary | ICD-10-CM | POA: Diagnosis not present

## 2017-11-30 DIAGNOSIS — K709 Alcoholic liver disease, unspecified: Secondary | ICD-10-CM | POA: Diagnosis not present

## 2017-11-30 DIAGNOSIS — R0989 Other specified symptoms and signs involving the circulatory and respiratory systems: Secondary | ICD-10-CM

## 2017-11-30 DIAGNOSIS — I739 Peripheral vascular disease, unspecified: Secondary | ICD-10-CM

## 2017-11-30 LAB — CALCIUM: Calcium: 11.7 mg/dL — ABNORMAL HIGH (ref 8.6–10.4)

## 2017-11-30 MED ORDER — CELECOXIB 200 MG PO CAPS: 200.0000 mg | ORAL_CAPSULE | Freq: Every day | ORAL | 0 refills | Status: AC

## 2017-11-30 MED ORDER — PREDNISONE 10 MG PO TABS: ORAL_TABLET | ORAL | 0 refills | Status: DC

## 2017-11-30 MED ORDER — FUROSEMIDE 20 MG PO TABS: ORAL_TABLET | ORAL | 0 refills | Status: AC

## 2017-11-30 NOTE — Progress Notes (Signed)
   Subjective:    Patient ID: Teresa Schneider, female    DOB: 26-May-1945, 72 y.o.   MRN: [id number]  HPI She is here today for repeat check of multiple medical issues.  She has history of alcohol abuse and continues to drink some but she does not say exactly how much.  She has alcoholic liver disease.  Fasting glucose is 104.  Albumin is low at 3.4 and total protein low at 5.8.   Her calcium was elevated at 11.2 and will be repeated today.  I am concerned she may have a parathyroid adenoma.  SGOT is elevated at 55 and SGPT normal at 23.  She will need follow-up carotid Dopplers in the near future.    Review of Systems asking about smoking cessation modalities other than Chantix which is expensive     Objective:   Physical Exam She has some areas consistent with contact dermatitis on her hands.  Has been doing some gardening.  Was given prednisone taper going from 50 mg to 0 mg over 6 days.  She has bilateral carotid bruits.  Cardiac exam regular rate and rhythm.  No thyromegaly.       Assessment & Plan:  Elevated serum calcium.  This will be repeated today and if it remains elevated she will need a parathyroid hormone assay.  Contact dermatitis-likely poison ivy.  Short taper of prednisone.  Alcoholic liver disease-continues to drink unfortunately  Low serum proteins secondary to alcoholic liver disease  Bilateral carotid bruits to have follow-up carotid Doppler study soon

## 2017-12-01 ENCOUNTER — Other Ambulatory Visit: Payer: Self-pay | Admitting: Internal Medicine

## 2017-12-01 NOTE — Progress Notes (Signed)
I tried to call her to give her calcium results and to schedule a lab appt for tomorrow.

## 2017-12-02 ENCOUNTER — Other Ambulatory Visit: Payer: Medicare HMO | Admitting: Internal Medicine

## 2017-12-02 DIAGNOSIS — R6889 Other general symptoms and signs: Secondary | ICD-10-CM | POA: Diagnosis not present

## 2017-12-03 LAB — PTH, INTACT AND CALCIUM
Calcium: 10.8 mg/dL — ABNORMAL HIGH (ref 8.6–10.4)
PTH: 20 pg/mL (ref 14–64)

## 2017-12-07 DIAGNOSIS — M545 Low back pain: Secondary | ICD-10-CM | POA: Diagnosis not present

## 2017-12-07 DIAGNOSIS — M546 Pain in thoracic spine: Secondary | ICD-10-CM | POA: Diagnosis not present

## 2017-12-15 NOTE — Patient Instructions (Signed)
Serum calcium to be repeated.  To have carotid Doppler soon.  Short taper prednisone for contact dermatitis.

## 2017-12-16 DIAGNOSIS — M546 Pain in thoracic spine: Secondary | ICD-10-CM | POA: Diagnosis not present

## 2017-12-16 DIAGNOSIS — M545 Low back pain: Secondary | ICD-10-CM | POA: Diagnosis not present

## 2017-12-16 DIAGNOSIS — M4004 Postural kyphosis, thoracic region: Secondary | ICD-10-CM | POA: Diagnosis not present

## 2017-12-23 ENCOUNTER — Ambulatory Visit (HOSPITAL_COMMUNITY)
Admission: RE | Admit: 2017-12-23 | Discharge: 2017-12-23 | Disposition: A | Discharge status: Home / Self Care | Payer: Medicare HMO | Source: Ambulatory Visit | Attending: Vascular Surgery | Admitting: Vascular Surgery

## 2017-12-23 DIAGNOSIS — I6523 Occlusion and stenosis of bilateral carotid arteries: Secondary | ICD-10-CM | POA: Diagnosis not present

## 2017-12-23 DIAGNOSIS — I779 Disorder of arteries and arterioles, unspecified: Secondary | ICD-10-CM | POA: Diagnosis not present

## 2017-12-23 DIAGNOSIS — I739 Peripheral vascular disease, unspecified: Secondary | ICD-10-CM

## 2017-12-24 DIAGNOSIS — M545 Low back pain: Secondary | ICD-10-CM | POA: Diagnosis not present

## 2017-12-24 DIAGNOSIS — M4004 Postural kyphosis, thoracic region: Secondary | ICD-10-CM | POA: Diagnosis not present

## 2017-12-24 DIAGNOSIS — M546 Pain in thoracic spine: Secondary | ICD-10-CM | POA: Diagnosis not present

## 2017-12-27 DIAGNOSIS — R6889 Other general symptoms and signs: Secondary | ICD-10-CM | POA: Diagnosis not present

## 2017-12-28 DIAGNOSIS — M545 Low back pain: Secondary | ICD-10-CM | POA: Diagnosis not present

## 2017-12-28 DIAGNOSIS — M546 Pain in thoracic spine: Secondary | ICD-10-CM | POA: Diagnosis not present

## 2017-12-28 DIAGNOSIS — M4004 Postural kyphosis, thoracic region: Secondary | ICD-10-CM | POA: Diagnosis not present

## 2017-12-31 DIAGNOSIS — M545 Low back pain: Secondary | ICD-10-CM | POA: Diagnosis not present

## 2017-12-31 DIAGNOSIS — M4004 Postural kyphosis, thoracic region: Secondary | ICD-10-CM | POA: Diagnosis not present

## 2017-12-31 DIAGNOSIS — M546 Pain in thoracic spine: Secondary | ICD-10-CM | POA: Diagnosis not present

## 2018-01-06 DIAGNOSIS — M546 Pain in thoracic spine: Secondary | ICD-10-CM | POA: Diagnosis not present

## 2018-01-06 DIAGNOSIS — M545 Low back pain: Secondary | ICD-10-CM | POA: Diagnosis not present

## 2018-01-06 DIAGNOSIS — M4004 Postural kyphosis, thoracic region: Secondary | ICD-10-CM | POA: Diagnosis not present

## 2018-01-06 DIAGNOSIS — R6889 Other general symptoms and signs: Secondary | ICD-10-CM | POA: Diagnosis not present

## 2018-01-11 DIAGNOSIS — M4004 Postural kyphosis, thoracic region: Secondary | ICD-10-CM | POA: Diagnosis not present

## 2018-01-11 DIAGNOSIS — M545 Low back pain: Secondary | ICD-10-CM | POA: Diagnosis not present

## 2018-01-11 DIAGNOSIS — M546 Pain in thoracic spine: Secondary | ICD-10-CM | POA: Diagnosis not present

## 2018-01-13 DIAGNOSIS — M545 Low back pain: Secondary | ICD-10-CM | POA: Diagnosis not present

## 2018-01-13 DIAGNOSIS — M4004 Postural kyphosis, thoracic region: Secondary | ICD-10-CM | POA: Diagnosis not present

## 2018-01-13 DIAGNOSIS — M546 Pain in thoracic spine: Secondary | ICD-10-CM | POA: Diagnosis not present

## 2018-01-19 DIAGNOSIS — M546 Pain in thoracic spine: Secondary | ICD-10-CM | POA: Diagnosis not present

## 2018-01-19 DIAGNOSIS — M545 Low back pain: Secondary | ICD-10-CM | POA: Diagnosis not present

## 2018-01-19 DIAGNOSIS — M4004 Postural kyphosis, thoracic region: Secondary | ICD-10-CM | POA: Diagnosis not present

## 2018-01-26 ENCOUNTER — Telehealth: Payer: Self-pay

## 2018-01-26 DIAGNOSIS — M4004 Postural kyphosis, thoracic region: Secondary | ICD-10-CM | POA: Diagnosis not present

## 2018-01-26 DIAGNOSIS — M546 Pain in thoracic spine: Secondary | ICD-10-CM | POA: Diagnosis not present

## 2018-01-26 DIAGNOSIS — M545 Low back pain: Secondary | ICD-10-CM | POA: Diagnosis not present

## 2018-01-26 NOTE — Telephone Encounter (Signed)
Left detailed message.   

## 2018-01-26 NOTE — Telephone Encounter (Signed)
Patient called states she has appointment tomorrow with ([facility]) and she can not get her mastectomy products without a prescription, she would like to know if you can write her a prescription for "mastectomy products" and fax it them at [phone]. Thank you.

## 2018-01-26 NOTE — Telephone Encounter (Signed)
They will send [place] a form to sign

## 2018-02-01 DIAGNOSIS — M4004 Postural kyphosis, thoracic region: Secondary | ICD-10-CM | POA: Diagnosis not present

## 2018-02-01 DIAGNOSIS — M546 Pain in thoracic spine: Secondary | ICD-10-CM | POA: Diagnosis not present

## 2018-02-01 DIAGNOSIS — M545 Low back pain: Secondary | ICD-10-CM | POA: Diagnosis not present

## 2018-02-15 ENCOUNTER — Other Ambulatory Visit: Payer: Self-pay | Admitting: Internal Medicine

## 2018-03-01 ENCOUNTER — Other Ambulatory Visit: Payer: Self-pay | Admitting: Internal Medicine

## 2018-03-04 ENCOUNTER — Other Ambulatory Visit: Payer: Medicare HMO | Admitting: Internal Medicine

## 2018-03-04 LAB — CALCIUM: Calcium: 9.3 mg/dL (ref 8.6–10.4)

## 2018-03-25 DIAGNOSIS — M25461 Effusion, right knee: Secondary | ICD-10-CM | POA: Diagnosis not present

## 2018-03-25 DIAGNOSIS — M25561 Pain in right knee: Secondary | ICD-10-CM | POA: Diagnosis not present

## 2018-03-31 DIAGNOSIS — M25461 Effusion, right knee: Secondary | ICD-10-CM | POA: Diagnosis not present

## 2018-04-04 DIAGNOSIS — H04122 Dry eye syndrome of left lacrimal gland: Secondary | ICD-10-CM | POA: Diagnosis not present

## 2018-04-08 ENCOUNTER — Emergency Department (HOSPITAL_COMMUNITY): Payer: Medicare HMO

## 2018-04-08 ENCOUNTER — Ambulatory Visit: Payer: Medicare HMO | Admitting: Internal Medicine

## 2018-04-08 ENCOUNTER — Encounter: Payer: Self-pay | Admitting: Internal Medicine

## 2018-04-08 ENCOUNTER — Encounter (HOSPITAL_COMMUNITY): Payer: Self-pay

## 2018-04-08 ENCOUNTER — Inpatient Hospital Stay (HOSPITAL_COMMUNITY)
Admission: EM | Admit: 2018-04-08 | Discharge: 2018-04-17 | DRG: 871 | Disposition: E | Payer: Medicare HMO | Attending: Internal Medicine | Admitting: Internal Medicine

## 2018-04-08 ENCOUNTER — Telehealth: Payer: Self-pay | Admitting: Internal Medicine

## 2018-04-08 DIAGNOSIS — R7989 Other specified abnormal findings of blood chemistry: Secondary | ICD-10-CM | POA: Diagnosis present

## 2018-04-08 DIAGNOSIS — R748 Abnormal levels of other serum enzymes: Secondary | ICD-10-CM | POA: Diagnosis not present

## 2018-04-08 DIAGNOSIS — Z515 Encounter for palliative care: Secondary | ICD-10-CM | POA: Diagnosis present

## 2018-04-08 DIAGNOSIS — K631 Perforation of intestine (nontraumatic): Secondary | ICD-10-CM | POA: Diagnosis not present

## 2018-04-08 DIAGNOSIS — Z888 Allergy status to other drugs, medicaments and biological substances status: Secondary | ICD-10-CM

## 2018-04-08 DIAGNOSIS — E440 Moderate protein-calorie malnutrition: Secondary | ICD-10-CM | POA: Diagnosis not present

## 2018-04-08 DIAGNOSIS — F329 Major depressive disorder, single episode, unspecified: Secondary | ICD-10-CM | POA: Diagnosis present

## 2018-04-08 DIAGNOSIS — Z91013 Allergy to seafood: Secondary | ICD-10-CM

## 2018-04-08 DIAGNOSIS — J189 Pneumonia, unspecified organism: Secondary | ICD-10-CM | POA: Diagnosis present

## 2018-04-08 DIAGNOSIS — I119 Hypertensive heart disease without heart failure: Secondary | ICD-10-CM | POA: Diagnosis present

## 2018-04-08 DIAGNOSIS — D638 Anemia in other chronic diseases classified elsewhere: Secondary | ICD-10-CM | POA: Diagnosis present

## 2018-04-08 DIAGNOSIS — G4730 Sleep apnea, unspecified: Secondary | ICD-10-CM | POA: Diagnosis present

## 2018-04-08 DIAGNOSIS — E872 Acidosis, unspecified: Secondary | ICD-10-CM | POA: Insufficient documentation

## 2018-04-08 DIAGNOSIS — I248 Other forms of acute ischemic heart disease: Secondary | ICD-10-CM | POA: Diagnosis present

## 2018-04-08 DIAGNOSIS — Z79899 Other long term (current) drug therapy: Secondary | ICD-10-CM

## 2018-04-08 DIAGNOSIS — F99 Mental disorder, not otherwise specified: Secondary | ICD-10-CM | POA: Diagnosis present

## 2018-04-08 DIAGNOSIS — D509 Iron deficiency anemia, unspecified: Secondary | ICD-10-CM | POA: Diagnosis present

## 2018-04-08 DIAGNOSIS — K7031 Alcoholic cirrhosis of liver with ascites: Secondary | ICD-10-CM | POA: Diagnosis present

## 2018-04-08 DIAGNOSIS — R4587 Impulsiveness: Secondary | ICD-10-CM | POA: Diagnosis present

## 2018-04-08 DIAGNOSIS — K7030 Alcoholic cirrhosis of liver without ascites: Secondary | ICD-10-CM | POA: Diagnosis not present

## 2018-04-08 DIAGNOSIS — K709 Alcoholic liver disease, unspecified: Secondary | ICD-10-CM | POA: Diagnosis present

## 2018-04-08 DIAGNOSIS — F10239 Alcohol dependence with withdrawal, unspecified: Secondary | ICD-10-CM | POA: Diagnosis present

## 2018-04-08 DIAGNOSIS — E871 Hypo-osmolality and hyponatremia: Secondary | ICD-10-CM | POA: Diagnosis present

## 2018-04-08 DIAGNOSIS — J984 Other disorders of lung: Secondary | ICD-10-CM | POA: Diagnosis not present

## 2018-04-08 DIAGNOSIS — D6959 Other secondary thrombocytopenia: Secondary | ICD-10-CM | POA: Diagnosis present

## 2018-04-08 DIAGNOSIS — R6521 Severe sepsis with septic shock: Secondary | ICD-10-CM | POA: Diagnosis not present

## 2018-04-08 DIAGNOSIS — M6282 Rhabdomyolysis: Secondary | ICD-10-CM

## 2018-04-08 DIAGNOSIS — F17210 Nicotine dependence, cigarettes, uncomplicated: Secondary | ICD-10-CM | POA: Diagnosis present

## 2018-04-08 DIAGNOSIS — I8510 Secondary esophageal varices without bleeding: Secondary | ICD-10-CM | POA: Diagnosis present

## 2018-04-08 DIAGNOSIS — Z978 Presence of other specified devices: Secondary | ICD-10-CM

## 2018-04-08 DIAGNOSIS — J440 Chronic obstructive pulmonary disease with acute lower respiratory infection: Secondary | ICD-10-CM | POA: Diagnosis present

## 2018-04-08 DIAGNOSIS — R4182 Altered mental status, unspecified: Secondary | ICD-10-CM | POA: Diagnosis not present

## 2018-04-08 DIAGNOSIS — R9431 Abnormal electrocardiogram [ECG] [EKG]: Secondary | ICD-10-CM | POA: Diagnosis not present

## 2018-04-08 DIAGNOSIS — Z681 Body mass index (BMI) 19 or less, adult: Secondary | ICD-10-CM | POA: Diagnosis not present

## 2018-04-08 DIAGNOSIS — Z880 Allergy status to penicillin: Secondary | ICD-10-CM

## 2018-04-08 DIAGNOSIS — J9811 Atelectasis: Secondary | ICD-10-CM | POA: Diagnosis not present

## 2018-04-08 DIAGNOSIS — K7290 Hepatic failure, unspecified without coma: Secondary | ICD-10-CM | POA: Diagnosis not present

## 2018-04-08 DIAGNOSIS — R740 Nonspecific elevation of levels of transaminase and lactic acid dehydrogenase [LDH]: Secondary | ICD-10-CM | POA: Diagnosis not present

## 2018-04-08 DIAGNOSIS — A419 Sepsis, unspecified organism: Principal | ICD-10-CM

## 2018-04-08 DIAGNOSIS — J80 Acute respiratory distress syndrome: Secondary | ICD-10-CM | POA: Diagnosis not present

## 2018-04-08 DIAGNOSIS — Z9013 Acquired absence of bilateral breasts and nipples: Secondary | ICD-10-CM

## 2018-04-08 DIAGNOSIS — R778 Other specified abnormalities of plasma proteins: Secondary | ICD-10-CM | POA: Diagnosis present

## 2018-04-08 DIAGNOSIS — R401 Stupor: Secondary | ICD-10-CM | POA: Diagnosis not present

## 2018-04-08 DIAGNOSIS — J9601 Acute respiratory failure with hypoxia: Secondary | ICD-10-CM | POA: Diagnosis not present

## 2018-04-08 DIAGNOSIS — K8020 Calculus of gallbladder without cholecystitis without obstruction: Secondary | ICD-10-CM | POA: Diagnosis not present

## 2018-04-08 DIAGNOSIS — T68XXXD Hypothermia, subsequent encounter: Secondary | ICD-10-CM | POA: Diagnosis not present

## 2018-04-08 DIAGNOSIS — K7011 Alcoholic hepatitis with ascites: Secondary | ICD-10-CM | POA: Diagnosis present

## 2018-04-08 DIAGNOSIS — K7200 Acute and subacute hepatic failure without coma: Secondary | ICD-10-CM | POA: Diagnosis not present

## 2018-04-08 DIAGNOSIS — R140 Abdominal distension (gaseous): Secondary | ICD-10-CM | POA: Diagnosis not present

## 2018-04-08 DIAGNOSIS — K7682 Hepatic encephalopathy: Secondary | ICD-10-CM | POA: Diagnosis present

## 2018-04-08 DIAGNOSIS — D649 Anemia, unspecified: Secondary | ICD-10-CM | POA: Diagnosis not present

## 2018-04-08 DIAGNOSIS — E870 Hyperosmolality and hypernatremia: Secondary | ICD-10-CM | POA: Diagnosis present

## 2018-04-08 DIAGNOSIS — K8070 Calculus of gallbladder and bile duct without cholecystitis without obstruction: Secondary | ICD-10-CM | POA: Diagnosis present

## 2018-04-08 DIAGNOSIS — Z853 Personal history of malignant neoplasm of breast: Secondary | ICD-10-CM

## 2018-04-08 DIAGNOSIS — K7040 Alcoholic hepatic failure without coma: Secondary | ICD-10-CM | POA: Diagnosis present

## 2018-04-08 DIAGNOSIS — T68XXXA Hypothermia, initial encounter: Secondary | ICD-10-CM | POA: Diagnosis not present

## 2018-04-08 DIAGNOSIS — K8050 Calculus of bile duct without cholangitis or cholecystitis without obstruction: Secondary | ICD-10-CM | POA: Diagnosis not present

## 2018-04-08 DIAGNOSIS — I959 Hypotension, unspecified: Secondary | ICD-10-CM | POA: Diagnosis not present

## 2018-04-08 DIAGNOSIS — N179 Acute kidney failure, unspecified: Secondary | ICD-10-CM | POA: Diagnosis not present

## 2018-04-08 DIAGNOSIS — K7460 Unspecified cirrhosis of liver: Secondary | ICD-10-CM | POA: Diagnosis not present

## 2018-04-08 DIAGNOSIS — Z7982 Long term (current) use of aspirin: Secondary | ICD-10-CM

## 2018-04-08 DIAGNOSIS — E876 Hypokalemia: Secondary | ICD-10-CM | POA: Diagnosis present

## 2018-04-08 DIAGNOSIS — R680 Hypothermia, not associated with low environmental temperature: Secondary | ICD-10-CM | POA: Diagnosis not present

## 2018-04-08 DIAGNOSIS — G9341 Metabolic encephalopathy: Secondary | ICD-10-CM | POA: Diagnosis not present

## 2018-04-08 DIAGNOSIS — Z818 Family history of other mental and behavioral disorders: Secondary | ICD-10-CM

## 2018-04-08 DIAGNOSIS — I361 Nonrheumatic tricuspid (valve) insufficiency: Secondary | ICD-10-CM | POA: Diagnosis not present

## 2018-04-08 DIAGNOSIS — R410 Disorientation, unspecified: Secondary | ICD-10-CM | POA: Diagnosis not present

## 2018-04-08 DIAGNOSIS — Z96641 Presence of right artificial hip joint: Secondary | ICD-10-CM | POA: Diagnosis present

## 2018-04-08 DIAGNOSIS — Z882 Allergy status to sulfonamides status: Secondary | ICD-10-CM

## 2018-04-08 LAB — CBC WITH DIFFERENTIAL/PLATELET
Abs Immature Granulocytes: 0.1 10*3/uL — ABNORMAL HIGH (ref 0.00–0.07)
Basophils Absolute: 0.1 10*3/uL (ref 0.0–0.1)
Basophils Relative: 0 %
EOS ABS: 0.2 10*3/uL (ref 0.0–0.5)
EOS PCT: 1 %
HEMATOCRIT: 46.3 % — AB (ref 36.0–46.0)
Hemoglobin: 14.6 g/dL (ref 12.0–15.0)
Immature Granulocytes: 1 %
Lymphocytes Relative: 11 %
Lymphs Abs: 2.1 10*3/uL (ref 0.7–4.0)
MCH: 31.1 pg (ref 26.0–34.0)
MCHC: 31.5 g/dL (ref 30.0–36.0)
MCV: 98.7 fL (ref 80.0–100.0)
MONO ABS: 1.6 10*3/uL — AB (ref 0.1–1.0)
Monocytes Relative: 8 %
Neutro Abs: 15 10*3/uL — ABNORMAL HIGH (ref 1.7–7.7)
Neutrophils Relative %: 79 %
PLATELETS: UNDETERMINED 10*3/uL (ref 150–400)
RBC: 4.69 MIL/uL (ref 3.87–5.11)
RDW: 16.2 % — AB (ref 11.5–15.5)
WBC: 19 10*3/uL — ABNORMAL HIGH (ref 4.0–10.5)
nRBC: 0.2 % (ref 0.0–0.2)

## 2018-04-08 LAB — COMPREHENSIVE METABOLIC PANEL
ALK PHOS: 131 U/L — AB (ref 38–126)
ALT: 41 U/L (ref 0–44)
ANION GAP: 12 (ref 5–15)
AST: 108 U/L — AB (ref 15–41)
Albumin: 3.9 g/dL (ref 3.5–5.0)
BILIRUBIN TOTAL: 5 mg/dL — AB (ref 0.3–1.2)
BUN: 21 mg/dL (ref 8–23)
CALCIUM: 9.3 mg/dL (ref 8.9–10.3)
CO2: 17 mmol/L — ABNORMAL LOW (ref 22–32)
CREATININE: 0.81 mg/dL (ref 0.44–1.00)
Chloride: 111 mmol/L (ref 98–111)
GFR calc Af Amer: >60 mL/min (ref >60)
GFR calc non Af Amer: >60 mL/min (ref >60)
GLUCOSE: 144 mg/dL — AB (ref 70–99)
Potassium: 3.6 mmol/L (ref 3.5–5.1)
Sodium: 140 mmol/L (ref 135–145)
TOTAL PROTEIN: 7.3 g/dL (ref 6.5–8.1)

## 2018-04-08 LAB — URINALYSIS, COMPLETE (UACMP) WITH MICROSCOPIC
BILIRUBIN URINE: NEGATIVE
Glucose, UA: 50 mg/dL — AB
Ketones, ur: NEGATIVE mg/dL
Leukocytes, UA: NEGATIVE
Nitrite: NEGATIVE
PH: 6 (ref 5.0–8.0)
Protein, ur: >=300 mg/dL — AB
Specific Gravity, Urine: 1.022 (ref 1.005–1.030)

## 2018-04-08 LAB — CK: CK TOTAL: 1223 U/L — AB (ref 38–234)

## 2018-04-08 LAB — LACTIC ACID, PLASMA
LACTIC ACID, VENOUS: 2.9 mmol/L — AB (ref 0.5–1.9)
Lactic Acid, Venous: 2.4 mmol/L (ref 0.5–1.9)

## 2018-04-08 LAB — I-STAT CG4 LACTIC ACID, ED
Lactic Acid, Venous: 4.29 mmol/L (ref 0.5–1.9)
Lactic Acid, Venous: 3.34 mmol/L (ref 0.5–1.9)

## 2018-04-08 LAB — ETHANOL: Alcohol, Ethyl (B): <10 mg/dL (ref <10)

## 2018-04-08 LAB — RAPID URINE DRUG SCREEN, HOSP PERFORMED
Amphetamines: NOT DETECTED
Barbiturates: NOT DETECTED
Benzodiazepines: NOT DETECTED
COCAINE: NOT DETECTED
OPIATES: NOT DETECTED
TETRAHYDROCANNABINOL: NOT DETECTED

## 2018-04-08 LAB — LIPASE, BLOOD: Lipase: 29 U/L (ref 11–51)

## 2018-04-08 LAB — AMMONIA: AMMONIA: 53 umol/L — AB (ref 9–35)

## 2018-04-08 LAB — I-STAT TROPONIN, ED: TROPONIN I, POC: 3.04 ng/mL — AB (ref 0.00–0.08)

## 2018-04-08 LAB — CBG MONITORING, ED: Glucose-Capillary: 128 mg/dL — ABNORMAL HIGH (ref 70–99)

## 2018-04-08 LAB — PROTIME-INR
INR: 1.48
PROTHROMBIN TIME: 17.8 s — AB (ref 11.4–15.2)

## 2018-04-08 LAB — BRAIN NATRIURETIC PEPTIDE: B NATRIURETIC PEPTIDE 5: 3145.6 pg/mL — AB (ref 0.0–100.0)

## 2018-04-08 MED ORDER — VANCOMYCIN HCL IN DEXTROSE 1-5 GM/200ML-% IV SOLN
1000.0000 mg | Freq: Once | INTRAVENOUS | Status: AC
  Administered 2018-04-08: 1000 mg via INTRAVENOUS
  Filled 2018-04-08: qty 200

## 2018-04-08 MED ORDER — ASPIRIN 300 MG RE SUPP
300.0000 mg | Freq: Once | RECTAL | Status: AC
  Administered 2018-04-08: 300 mg via RECTAL
  Filled 2018-04-08: qty 1

## 2018-04-08 MED ORDER — SODIUM CHLORIDE 0.9 % IV SOLN
INTRAVENOUS | Status: DC
  Administered 2018-04-08 – 2018-04-13 (×8): via INTRAVENOUS

## 2018-04-08 MED ORDER — MORPHINE SULFATE (PF) 2 MG/ML IV SOLN
2.0000 mg | Freq: Once | INTRAVENOUS | Status: AC
  Administered 2018-04-08: 2 mg via INTRAVENOUS
  Filled 2018-04-08: qty 1

## 2018-04-08 MED ORDER — ASPIRIN 300 MG RE SUPP
300.0000 mg | Freq: Every day | RECTAL | Status: DC
  Administered 2018-04-10: 300 mg via RECTAL
  Filled 2018-04-08 (×2): qty 1

## 2018-04-08 MED ORDER — ASPIRIN EC 81 MG PO TBEC: 81.0000 mg | DELAYED_RELEASE_TABLET | Freq: Every day | ORAL | Status: DC

## 2018-04-08 MED ORDER — LACTULOSE ENEMA
300.0000 mL | Freq: Once | ORAL | Status: DC
  Filled 2018-04-08: qty 300

## 2018-04-08 MED ORDER — CALCIUM CARBONATE-VITAMIN D 500-200 MG-UNIT PO TABS: ORAL_TABLET | Freq: Every day | ORAL | Status: DC

## 2018-04-08 MED ORDER — LACTATED RINGERS IV BOLUS
1000.0000 mL | Freq: Once | INTRAVENOUS | Status: AC
  Administered 2018-04-08: 1000 mL via INTRAVENOUS

## 2018-04-08 MED ORDER — THIAMINE HCL 100 MG/ML IJ SOLN
100.0000 mg | Freq: Every day | INTRAMUSCULAR | Status: DC
  Administered 2018-04-08 – 2018-04-11 (×4): 100 mg via INTRAVENOUS
  Filled 2018-04-08 (×4): qty 2

## 2018-04-08 MED ORDER — ONDANSETRON HCL 4 MG/2ML IJ SOLN: 4.0000 mg | Freq: Four times a day (QID) | INTRAMUSCULAR | Status: DC | PRN

## 2018-04-08 MED ORDER — SODIUM CHLORIDE 0.9 % IV SOLN
2.0000 g | INTRAVENOUS | Status: DC
  Administered 2018-04-09 – 2018-04-10 (×2): 2 g via INTRAVENOUS
  Filled 2018-04-08 (×3): qty 2

## 2018-04-08 MED ORDER — LACTULOSE 10 GM/15ML PO SOLN
10.0000 g | Freq: Two times a day (BID) | ORAL | Status: DC
  Filled 2018-04-08: qty 15

## 2018-04-08 MED ORDER — ACETAMINOPHEN 325 MG PO TABS: 650.0000 mg | ORAL_TABLET | Freq: Four times a day (QID) | ORAL | Status: DC | PRN

## 2018-04-08 MED ORDER — ONDANSETRON HCL 4 MG/2ML IJ SOLN
4.0000 mg | Freq: Once | INTRAMUSCULAR | Status: AC
  Administered 2018-04-08: 4 mg via INTRAVENOUS
  Filled 2018-04-08: qty 2

## 2018-04-08 MED ORDER — IOHEXOL 300 MG/ML  SOLN
100.0000 mL | Freq: Once | INTRAMUSCULAR | Status: AC | PRN
  Administered 2018-04-08: 100 mL via INTRAVENOUS

## 2018-04-08 MED ORDER — HEPARIN (PORCINE) 25000 UT/250ML-% IV SOLN
900.0000 [IU]/h | INTRAVENOUS | Status: DC
  Administered 2018-04-08 (×2): 600 [IU]/h via INTRAVENOUS
  Administered 2018-04-10: 900 [IU]/h via INTRAVENOUS
  Filled 2018-04-08 (×3): qty 250

## 2018-04-08 MED ORDER — SODIUM CHLORIDE 0.9 % IV SOLN
2.0000 g | Freq: Once | INTRAVENOUS | Status: AC
  Administered 2018-04-08: 2 g via INTRAVENOUS
  Filled 2018-04-08: qty 2

## 2018-04-08 MED ORDER — METRONIDAZOLE IN NACL 5-0.79 MG/ML-% IV SOLN
500.0000 mg | Freq: Once | INTRAVENOUS | Status: AC
  Administered 2018-04-08: 500 mg via INTRAVENOUS
  Filled 2018-04-08: qty 100

## 2018-04-08 MED ORDER — VANCOMYCIN HCL 500 MG IV SOLR
500.0000 mg | Freq: Two times a day (BID) | INTRAVENOUS | Status: DC
  Filled 2018-04-08: qty 500

## 2018-04-08 MED ORDER — ONDANSETRON HCL 4 MG PO TABS: 4.0000 mg | ORAL_TABLET | Freq: Four times a day (QID) | ORAL | Status: DC | PRN

## 2018-04-08 MED ORDER — ACETAMINOPHEN 650 MG RE SUPP: 650.0000 mg | Freq: Four times a day (QID) | RECTAL | Status: DC | PRN

## 2018-04-08 NOTE — Progress Notes (Signed)
ANTICOAGULATION CONSULT NOTE - Initial Consult  Pharmacy Consult for Heparin Indication: chest pain/ACS  Allergies  Allergen Reactions  . Salmon [Fish Allergy] Dermatitis  . Other Dermatitis    Paprika  . Sulfamethoxazole-Trimethoprim     REACTION: rash  . Penicillins Rash  . Pyridium [Phenazopyridine Hcl] Rash    Patient Measurements: Height: 5' (152.4 cm) Weight: 113 lb 15.7 oz (51.7 kg) IBW/kg (Calculated) : 45.5 Heparin Dosing Weight: 51.7 kg  Vital Signs: Temp: 98.4 F (36.9 C) (11/22 1328) Temp Source: Rectal (11/22 1328) BP: 161/101 (11/22 1600) Pulse Rate: 102 (11/22 1600)  Labs: Recent Labs    03/28/2018 1358 04/07/2018 1548  HGB 14.6  --   HCT 46.3*  --   PLT PLATELET CLUMPS NOTED ON SMEAR, UNABLE TO ESTIMATE  --   LABPROT  --  17.8*  INR  --  1.48  CREATININE 0.81  --   CKTOTAL 1,223*  --     Estimated Creatinine Clearance: 46.4 mL/min (by C-G formula based on SCr of 0.81 mg/dL).   Medical History: Past Medical History:  Diagnosis Date  . ADD (attention deficit disorder)   . Alcoholic hepatitis   . Anemia    low iron  . Ascites   . Depression   . Palpitations   . Sleep apnea    does not use cpap    Assessment: Patient 69 yoF presenting with AMS and elevated troponin. Pharmacy has been consulted for heparin dosing. Unable to confirm at this time if patient on anticoagulation PTA due to altered state. Per chart review, there is no history of patient taking anticoagulation.  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  Goal of Therapy:   Heparin level 0.3-0.5 units/mL Monitor platelets by anticoagulation protocol: Yes   Plan:  Start heparin infusion at 600 units/hr Check anti-Xa level in 8 hours and daily while on heparin Continue to monitor H&H and platelets  Monitor for s/sx of bleed  Willia Craze, Pharmacy Student

## 2018-04-08 NOTE — ED Notes (Signed)
Attempted report 

## 2018-04-08 NOTE — ED Notes (Addendum)
Attempted report; RN does not feel pt is appropriate for this bed assignment; 5W charge to call bed placement

## 2018-04-08 NOTE — H&P (Addendum)
History and Physical    DOA: 04/11/2018  PCP: Elby Showers, MD  Patient coming from: Home  Chief Complaint: Altered mental status  HPI: Teresa Schneider is a 72 y.o. female with history h/o alcoholic liver disease,?  Varices, ascites, depression/ADD, sleep apnea noncompliant with CPAP, chronic iron deficiency anemia who apparently lives by herself brought in by friend after found to be confused and disoriented.  Currently patient nonverbal and just nods her head when called her name.  No family or friends present bedside.  I tried calling her emergency contact listed in the medical record but no response.  Most of the history obtained by talking to ED physician and chart review.  Per ED report, patient's friend who lives next-door found her sitting in a chair confused.  She was apparently at her baseline yesterday when visited by the same person.  Patient does have a history of heavy alcohol use.  It is unclear if she still drinks alcohol.  Work-up in the ED revealed abnormal EKG with lateral T wave inversions and lab work showing elevated troponin, leukocytosis, elevated liver enzymes, ammonia level, CK and lactate.  Patient initiated on sepsis protocol by ED and initiated on empiric antibiotics.  She underwent CT abdomen which showed gallstones as well as 3 mm CBD stone.  She has so far received vancomycin, cefepime and Flagyl.  Patient evaluated by cardiology and started on rectal aspirin/heparin drip.  GI evaluation is pending.  Hospitalist service requested to admit this patient for further evaluation and management.   Review of Systems: As per HPI otherwise 10 point review of systems negative.    Past Medical History:  Diagnosis Date  . ADD (attention deficit disorder)   . Alcoholic hepatitis   . Anemia    low iron  . Ascites   . Depression   . Palpitations   . Sleep apnea    does not use cpap    Past Surgical History:  Procedure Laterality Date  . COLONOSCOPY    .  FRACTURE SURGERY Right    wrist  . JOINT REPLACEMENT Right    hip  . MASTECTOMY Bilateral    2 different times    Social history:  reports that she has been smoking cigarettes. She has a 26.25 pack-year smoking history. She has never used smokeless tobacco. She reports that she drinks alcohol. She reports that she does not use drugs.   Allergies  Allergen Reactions  . Salmon [Fish Allergy] Dermatitis  . Other Dermatitis    Paprika  . Sulfamethoxazole-Trimethoprim     REACTION: rash  . Penicillins Rash  . Pyridium [Phenazopyridine Hcl] Rash    Family History  Problem Relation Age of Onset  . Suicidality Sister   . Glaucoma Mother   . Depression Mother   . Cancer Father       Prior to Admission medications   Medication Sig Start Date End Date Taking? Authorizing Provider  aspirin EC 81 MG tablet Take 81 mg by mouth daily.    [provider]  Calcium Carbonate-Vit D-Min (CALCIUM 1200 PO) Take 1,200 mg by mouth daily.    [provider]  celecoxib (CELEBREX) 200 MG capsule Take 1 capsule (200 mg total) by mouth daily. 11/30/17   Elby Showers, MD  compounded topicals builder Apply 1 application topically. [place], [place]    [provider]  dextroamphetamine (DEXEDRINE SPANSULE) 15 MG 24 hr capsule Take 15 mg by mouth 2 (two) times daily. Reported on 10/18/2015  [provider]  furosemide (LASIX) 20 MG tablet Take 1 tablet by mouth every other day 11/30/17   Elby Showers, MD  ibandronate (BONIVA) 150 MG tablet One po monthly on empty stomach. 09/30/17   Elby Showers, MD  losartan (COZAAR) 25 MG tablet TAKE 1 TABLET EVERY DAY 11/08/17   Elby Showers, MD  Multiple Vitamins-Minerals (CENTRUM SILVER 50+WOMEN PO) Take by mouth.    [provider]  mupirocin ointment (BACTROBAN) 2 % Place 1 application into the nose 2 (two) times daily. 06/14/17   Elby Showers, MD  predniSONE (DELTASONE) 10 MG tablet Take in tapering dose 5-4-3-2-1  11/30/17   Elby Showers, MD  rosuvastatin (CRESTOR) 5 MG tablet TAKE 1 TABLET EVERY DAY 02/16/18   Elby Showers, MD  Vitamin D, Ergocalciferol, (DRISDOL) 50000 units CAPS capsule Take 50,000 Units by mouth every 7 (seven) days.    [provider]    Physical Exam: Vitals:   03/26/2018 1630 03/23/2018 1700 04/09/2018 1730 03/26/2018 1800  BP: (!) 151/69 (!) 156/80 (!) 152/79   Pulse: 97 100 97 94  Resp: (!) 23 (!) 22 (!) 22 (!) 23  Temp:      TempSrc:      SpO2: 93% 92% 92% 95%  Weight:      Height:        Constitutional: Cachectic appearing female, confused, no acute distress Vitals:   03/31/2018 1630 04/14/2018 1700 04/03/2018 1730 04/16/2018 1800  BP: (!) 151/69 (!) 156/80 (!) 152/79   Pulse: 97 100 97 94  Resp: (!) 23 (!) 22 (!) 22 (!) 23  Temp:      TempSrc:      SpO2: 93% 92% 92% 95%  Weight:      Height:       Eyes: PERRL, lids and conjunctivae normal ENMT: Mucous membranes are dry. Posterior pharynx clear of any exudate or lesions.Normal dentition.  Neck: normal, supple, no masses, no thyromegaly Respiratory: clear to auscultation bilaterally, no wheezing, no crackles. Normal respiratory effort. No accessory muscle use.  Cardiovascular: Regular rate and rhythm, no murmurs / rubs / gallops. No extremity edema. 2+ pedal pulses. No carotid bruits.  Abdomen: Patient blinks and moans with epigastric and midabdominal palpation, no guarding.  Could not test rebound. Bowel sounds positive.  Musculoskeletal: no clubbing / cyanosis. No joint deformity upper and lower extremities.  Neurologic: Moving all extremities, could not test further.  Not following commands Psychiatric: Disoriented and nonverbal currently.   SKIN/catheters: no rashes, lesions, ulcers. No induration  Labs on Admission: I have personally reviewed following labs and imaging studies  CBC: Recent Labs  Lab 03/23/2018 1358  WBC 19.0*  NEUTROABS 15.0*  HGB 14.6  HCT 46.3*  MCV 98.7  PLT PLATELET CLUMPS NOTED  ON SMEAR, UNABLE TO ESTIMATE   Basic Metabolic Panel: Recent Labs  Lab 04/13/2018 1358  NA 140  K 3.6  CL 111  CO2 17*  GLUCOSE 144*  BUN 21  CREATININE 0.81  CALCIUM 9.3   GFR: Estimated Creatinine Clearance: 46.4 mL/min (by C-G formula based on SCr of 0.81 mg/dL). Liver Function Tests: Recent Labs  Lab 04/16/2018 1358  AST 108*  ALT 41  ALKPHOS 131*  BILITOT 5.0*  PROT 7.3  ALBUMIN 3.9   Recent Labs  Lab 03/24/2018 1358  LIPASE 29   Recent Labs  Lab 04/03/2018 1358  AMMONIA 53*   Coagulation Profile: Recent Labs  Lab 04/14/2018 1548  INR 1.48  Cardiac Enzymes: Recent Labs  Lab 04/12/2018 1358  CKTOTAL 1,223*   BNP (last 3 results) No results for input(s): PROBNP in the last 8760 hours. HbA1C: No results for input(s): HGBA1C in the last 72 hours. CBG: Recent Labs  Lab 04/01/2018 1320  GLUCAP 128*   Lipid Profile: No results for input(s): CHOL, HDL, LDLCALC, TRIG, CHOLHDL, LDLDIRECT in the last 72 hours. Thyroid Function Tests: No results for input(s): TSH, T4TOTAL, FREET4, T3FREE, THYROIDAB in the last 72 hours. Anemia Panel: No results for input(s): VITAMINB12, FOLATE, FERRITIN, TIBC, IRON, RETICCTPCT in the last 72 hours. Urine analysis:    Component Value Date/Time   COLORURINE YELLOW 04/02/2018 1625   APPEARANCEUR CLEAR 03/21/2018 1625   LABSPEC 1.022 04/04/2018 1625   PHURINE 6.0 03/29/2018 1625   GLUCOSEU 50 (A) 04/10/2018 1625   HGBUR MODERATE (A) 03/21/2018 1625   BILIRUBINUR NEGATIVE 03/29/2018 1625   BILIRUBINUR NEG 06/14/2017 1121   KETONESUR NEGATIVE 03/28/2018 1625   PROTEINUR >=300 (A) 03/27/2018 1625   UROBILINOGEN 0.2 06/14/2017 1121   UROBILINOGEN 2.0 (H) 12/20/2014 1550   NITRITE NEGATIVE 03/19/2018 1625   LEUKOCYTESUR NEGATIVE 04/07/2018 1625    Radiological Exams on Admission: Ct Head Wo Contrast  Result Date: 03/28/2018 CLINICAL DATA:  Altered mental status. EXAM: CT HEAD WITHOUT CONTRAST TECHNIQUE: Contiguous axial  images were obtained from the base of the skull through the vertex without intravenous contrast. COMPARISON:  None. FINDINGS: Brain: Normal appearing cerebral hemispheres and posterior fossa structures. Normal size and position of the ventricles. No intracranial hemorrhage, mass lesion or CT evidence of acute infarction. Vascular: No hyperdense vessel or unexpected calcification. Skull: Normal. Negative for fracture or focal lesion. Sinuses/Orbits: Status post bilateral cataract extraction. Unremarkable paranasal sinuses. 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 IMPRESSION: No acute abnormality. 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 Electronically Signed   By: Claudie Revering M.D.   On: 04/06/2018 14:01   Ct Abdomen Pelvis W Contrast  Result Date: 03/24/2018 CLINICAL DATA:  Abdominal distention, altered mentation and lethargy. History of alcoholism. EXAM: CT ABDOMEN AND PELVIS WITH CONTRAST TECHNIQUE: Multidetector CT imaging of the abdomen and pelvis was performed using the standard protocol following bolus administration of intravenous contrast. CONTRAST:  136mL OMNIPAQUE IOHEXOL 300 MG/ML  SOLN COMPARISON:  07/11/2007 FINDINGS: Lower chest: Cardiomegaly without pericardial effusion or thickening. Bibasilar dependent atelectasis is noted. Further assessment is somewhat limited by respiratory motion artifacts. Small paraesophageal varices with thickened distal esophagus redemonstrated. Hepatobiliary: Surface nodularity of the liver consistent with morphologic changes of cirrhosis. Tiny too small to characterize hypodensities are noted within the left and right hepatic lobes too small to further characterize but may reflect cysts or hemangiomata. Gallbladder is non thickened but contains layering dependent calculi. There is a 3 mm calculus also noted in the distal common bile duct, series 3/28 without significant pancreatic nor extrahepatic  biliary dilatation noted. Correlate with liver function tests there has been some interval development portal varices. Pancreas: Atrophic pancreas without inflammation. Spleen: The spleen measures 12.1 x 4.8 x 9.7 cm (volume = 290 cm^3), normal in size and is without focal mass. Adrenals/Urinary Tract: Normal bilateral adrenal glands and kidneys. No nephrolithiasis, renal mass nor obstructive uropathy. The urinary bladder is partially obscured by the patient's right hip arthroplasty streak artifacts. No focal mural thickening, calculus or mass is identified of the bladder. Stomach/Bowel: Decompressed stomach. No small nor large bowel obstruction or inflammation. The appendix is not confidently identified but no pericecal inflammation is  seen. Vascular/Lymphatic: Aortoiliac and branch vessel atherosclerosis without aneurysm or adenopathy. Reproductive: Atrophic uterus. No adnexal mass. Mild engorgement of the left gonadal vein. Other: No ascites or free air. Musculoskeletal: Right hip arthroplasty. Chronic moderate T11 and mild T12 compression deformities. No acute osseous abnormality. IMPRESSION: 1. 3 mm calculus in the distal common bile duct without significant pancreatic or extrahepatic biliary dilatation. Correlate with liver function tests. Numerous gallstones are noted within the physiologically distended gallbladder. 2. Surface nodularity of the liver consistent with morphologic changes of cirrhosis. Tiny too small to further characterize hypodensities in the left and right hepatic lobes but statistically more likely to represent cysts or hemangiomata. 3. Chronic mild esophageal thickening along the distal aspect. Esophagitis is not excluded. 4. Small paraesophageal and periportal varices. 5. Aortoiliac and branch vessel atherosclerosis. 6. Chronic moderate T11 and mild T12 compression deformities. Electronically Signed   By: Ashley Royalty M.D.   On: 04/11/2018 15:54   Dg Chest Port 1 View  Result Date:  03/22/2018 CLINICAL DATA:  Altered mental status. EXAM: PORTABLE CHEST 1 VIEW COMPARISON:  10/30/2015 and prior radiographs FINDINGS: The cardiomediastinal silhouette is unremarkable. There is no evidence of focal airspace disease, pulmonary edema, suspicious pulmonary nodule/mass, pleural effusion, or pneumothorax. No acute bony abnormalities are identified. Chronic changes of the RIGHT humeral head again noted. IMPRESSION: No active disease. Electronically Signed   By: Margarette Canada M.D.   On: 04/07/2018 13:54    EKG: Independently reviewed.  Sinus rhythm with tall R waves and lateral T wave inversions.     Assessment and Plan:   1.  Acute metabolic encephalopathy: Secondary to hepatic encephalopathy versus sepsis versus CVA.  CT head negative for any acute findings.  Ammonia level is modestly elevated (unknown baseline).  Patient currently not safe for oral intake with high aspiration risk.  Will give lactulose enema and IV fluids/antibiotics.  Will consider MRI head if no improvement with these measures and when family/friends available for MRI screening questionnaire.  Will maintain permissive hypertension for now.  Alcohol level less than 10, does not appear to be in acute withdrawal although postictal state from his seizure could be a possibility.  IV thiamine.  Consider NG tube for meds including lactulose if okay per GI.  2.  Sepsis syndrome, abdominal tenderness: Patient has elevated lactate, leukocytosis at 19,000 and CT abdomen shows evidence of gallstones and CBD 3 mm stone which also correlates with elevated LFTs.  Will treat empirically for cholangitis.  GI eval pending.  N.p.o., IV fluids (moderate rate given history of ascites/liver disease) and continue cefepime/Flagyl.  Blood cultures.  Patient did receive 1 dose of vancomycin in the ED.  Blood pressure improved.  Repeat labs in a.m.  3. Troponemia: No report of chest pain but patient unable to give history currently. Patient does  have abnormal EKG although could be LVH related.  Will obtain echo.  Appreciate cardiology evaluation who feel demand ischemia secondary to problem #2.  Patient received rectal aspirin in the ED and heparin drip has been initiated.  4.  Alcoholic liver disease with varices/ascites: CT abdomen does report cirrhotic changes and varices.  No signs of bleeding.  Hemoglobin stable.  Elevated lactate could be secondary to liver disease.  Currently no clinical signs of fluid retention.  Watch on IV hydration.  Albumin/protein level WNL  5.  Depression/ADD: Hold oral medications for now  6.  Elevated CK: Hold statins.  Unclear how long she was in the chair.?  Seizure episode.  Hydrate and repeat labs in a.m.  DVT prophylaxis: On heparin drip  Code Status: Full code  Family Communication: Could not contact Consults called: Cardiology/GI Admission status:  Patient admitted as inpatient to stepdown unit as anticipated LOS greater than 2 midnights    Guilford Shi MD [facility] Pager [phone]  If 7PM-7AM, please contact night-coverage [email or web address] Password [facility]  03/25/2018, 6:12 PM

## 2018-04-08 NOTE — Consult Note (Signed)
Cardiology Consultation:   Patient ID: Teresa Schneider; [id number]; 1946/02/29   Admit date: 04/11/2018 Date of Consult: 04/03/2018  Primary Care Provider: Elby Showers, MD Primary Cardiologist: New to [place]; Dr. Ellyn Hack Primary Electrophysiologist:  None   Patient Profile:   Teresa Schneider is a 72 y.o. female with a PMH of alcohol abuse, tobacco abuse, COPD, and mild bilateral carotid artery disease who is being seen today for the evaluation of elevated troponin at the request of Dr. Ronnald Nian.  History of Present Illness:   [name] continues to be significantly altered and unable to participate in history taking. No family or friends present at bedside to assist with history. She is minimally responsive to questions. Occasionally shakes her head no but unclear if she is answering appropriately. She shakes her head no when asked if she has had any chest pain. Per ED note, patient was found in an altered state by her neighbor on 04/09/2018. Neighbor stated she had been declining over the last two day (unclear what that means). Neighbor reports the patient has a history of alcohol abuse and psychiatric illness.   She has no prior cardiac history. No prior ischemic evaluation documented. She had an echo in 2018 which showed EF 55-60%, mild LVH, and no wall motion abnormalities.   ED course: hypertensive, tachycardic, and tachypneic, afebrile, and satting well on RA. Labs notable for electrolytes wnl, Cr 0.8, AST 108, ALT 41, Ammonia 53, Tbili 5.0, WBC 19, Hgb 14.6, PLT clumped, Trop 3.04, CK 1223, lactic acid 4.29, Etoh <10. EKG with sinus rhythm with submm STE in I and V3, and TWI in III, aVF, and V4-6 (new from previous EKG). CT head without acute findings. CXR without acute findings. CT A/P with 31mm calculus in the distal CBD and numerous gallstones in a distended gallbladder, cirrhosis, small esophageal and periportal varices, and aortoiliac/branch vessel atherosclerosis.  She was started on IV antibiotics. Cardiology asked to evaluate for elevated troponin.  Past Medical History:  Diagnosis Date  . ADD (attention deficit disorder)   . Alcoholic hepatitis   . Anemia    low iron  . Ascites   . Depression   . Palpitations   . Sleep apnea    does not use cpap    Past Surgical History:  Procedure Laterality Date  . COLONOSCOPY    . FRACTURE SURGERY Right    wrist  . JOINT REPLACEMENT Right    hip  . MASTECTOMY Bilateral    2 different times     Home Medications:  Prior to Admission medications   Medication Sig Start Date End Date Taking? Authorizing Provider  aspirin EC 81 MG tablet Take 81 mg by mouth daily.    [provider]  Calcium Carbonate-Vit D-Min (CALCIUM 1200 PO) Take 1,200 mg by mouth daily.    [provider]  celecoxib (CELEBREX) 200 MG capsule Take 1 capsule (200 mg total) by mouth daily. 11/30/17   Elby Showers, MD  compounded topicals builder Apply 1 application topically. [place], [place]    [provider]  dextroamphetamine (DEXEDRINE SPANSULE) 15 MG 24 hr capsule Take 15 mg by mouth 2 (two) times daily. Reported on 10/18/2015    [provider]  furosemide (LASIX) 20 MG tablet Take 1 tablet by mouth every other day 11/30/17   Elby Showers, MD  ibandronate (BONIVA) 150 MG tablet One po monthly on empty stomach. 09/30/17   Elby Showers, MD  losartan (COZAAR) 25  MG tablet TAKE 1 TABLET EVERY DAY 11/08/17   Elby Showers, MD  Multiple Vitamins-Minerals (CENTRUM SILVER 50+WOMEN PO) Take by mouth.    [provider]  mupirocin ointment (BACTROBAN) 2 % Place 1 application into the nose 2 (two) times daily. 06/14/17   Elby Showers, MD  predniSONE (DELTASONE) 10 MG tablet Take in tapering dose 5-4-3-2-1 11/30/17   Elby Showers, MD  rosuvastatin (CRESTOR) 5 MG tablet TAKE 1 TABLET EVERY DAY 02/16/18   Elby Showers, MD  Vitamin D, Ergocalciferol, (DRISDOL) 50000 units CAPS capsule Take 50,000  Units by mouth every 7 (seven) days.    [provider]    Inpatient Medications: Scheduled Meds: . aspirin  300 mg Rectal Once   Continuous Infusions: . ceFEPime (MAXIPIME) IV    . [START ON 04/09/2018] ceFEPime (MAXIPIME) IV    . lactated ringers    . [START ON 04/09/2018] vancomycin    . vancomycin     PRN Meds:   Allergies:    Allergies  Allergen Reactions  . Salmon [Fish Allergy] Dermatitis  . Other Dermatitis    Paprika  . Sulfamethoxazole-Trimethoprim     REACTION: rash  . Penicillins Rash  . Pyridium [Phenazopyridine Hcl] Rash    Social History:   Social History   Socioeconomic History  . Marital status: Single    Spouse name: Not on file  . Number of children: Not on file  . Years of education: Not on file  . Highest education level: Not on file  Occupational History  . Not on file  Social Needs  . Financial resource strain: Not on file  . Food insecurity:    Worry: Not on file    Inability: Not on file  . Transportation needs:    Medical: Not on file    Non-medical: Not on file  Tobacco Use  . Smoking status: Current Every Day Smoker    Packs/day: 0.75    Years: 35.00    Pack years: 26.25    Types: Cigarettes    Last attempt to quit: 09/26/2016    Years since quitting: 1.5  . Smokeless tobacco: Never Used  . Tobacco comment: TRYING TO QUIT, SMOKES "LESS THAN A PACK A DAY"   Substance and Sexual Activity  . Alcohol use: Yes    Comment: 2-3 oz four times a week   . Drug use: No  . Sexual activity: Never  Lifestyle  . Physical activity:    Days per week: Not on file    Minutes per session: Not on file  . Stress: Not on file  Relationships  . Social connections:    Talks on phone: Not on file    Gets together: Not on file    Attends religious service: Not on file    Active member of club or organization: Not on file    Attends meetings of clubs or organizations: Not on file    Relationship status: Not on file  . Intimate  partner violence:    Fear of current or ex partner: Not on file    Emotionally abused: Not on file    Physically abused: Not on file    Forced sexual activity: Not on file  Other Topics Concern  . Not on file  Social History Narrative  . Not on file    Family History:    Family History  Problem Relation Age of Onset  . Suicidality Sister   . Glaucoma Mother   .  Depression Mother   . Cancer Father      ROS:  Please see the history of present illness.   All other ROS reviewed and negative.     Physical Exam/Data:   Vitals:   04/06/2018 1328 03/30/2018 1329 04/07/2018 1430 04/01/2018 1500  BP:   (!) 166/78 (!) 159/86  Pulse:   88 88  Resp:   19 (!) 26  Temp: 98.4 F (36.9 C)     TempSrc: Rectal     SpO2:   98% 96%  Weight:  51.7 kg    Height:  5' (1.524 m)     No intake or output data in the 24 hours ending 04/02/2018 1546 Filed Weights   04/12/2018 1329  Weight: 51.7 kg   Body mass index is 22.26 kg/m.  General:  Thin female laying in bed in NAD. Altered and unable to participate in physical exam. Minimally responsive to questions HEENT: sclera anicteric  Neck: no JVD Endocrine:  No thryomegaly Vascular: distal pulses 2+ bilaterally Cardiac:  normal S1, S2; RRR; no murmurs, rubs, or gallops Lungs:  clear to auscultation bilaterally, no wheezing, rhonchi or rales  Abd: NABS, soft, no hepatomegaly Ext: no edema, chronic venous stasis skin changes Musculoskeletal:  No deformities Skin: warm and dry  Neuro: unable to assess Psych:  Unable to asses  EKG:  The EKG was personally reviewed and demonstrates:  EKG with sinus rhythm with submm STE in I and V3, and TWI in III, aVF, and V4-6 (new from previous EKG) Telemetry:  Telemetry was personally reviewed and demonstrates:  Sinus tachycardia  Relevant CV Studies: None  Laboratory Data:  Chemistry Recent Labs  Lab 04/03/2018 1358  NA 140  K 3.6  CL 111  CO2 17*  GLUCOSE 144*  BUN 21  CREATININE 0.81  CALCIUM 9.3    GFRNONAA >60  GFRAA >60  ANIONGAP 12    Recent Labs  Lab 03/30/2018 1358  PROT 7.3  ALBUMIN 3.9  AST 108*  ALT 41  ALKPHOS 131*  BILITOT 5.0*   Hematology Recent Labs  Lab 03/18/2018 1358  WBC 19.0*  RBC 4.69  HGB 14.6  HCT 46.3*  MCV 98.7  MCH 31.1  MCHC 31.5  RDW 16.2*  PLT PLATELET CLUMPS NOTED ON SMEAR, UNABLE TO ESTIMATE   Cardiac EnzymesNo results for input(s): TROPONINI in the last 168 hours.  Recent Labs  Lab 03/31/2018 1449  TROPIPOC 3.04*    BNPNo results for input(s): BNP, PROBNP in the last 168 hours.  DDimer No results for input(s): DDIMER in the last 168 hours.  Radiology/Studies:  Ct Head Wo Contrast  Result Date: 04/09/2018 CLINICAL DATA:  Altered mental status. EXAM: CT HEAD WITHOUT CONTRAST TECHNIQUE: Contiguous axial images were obtained from the base of the skull through the vertex without intravenous contrast. COMPARISON:  None. FINDINGS: Brain: Normal appearing cerebral hemispheres and posterior fossa structures. Normal size and position of the ventricles. No intracranial hemorrhage, mass lesion or CT evidence of acute infarction. Vascular: No hyperdense vessel or unexpected calcification. Skull: Normal. Negative for fracture or focal lesion. Sinuses/Orbits: Status post bilateral cataract extraction. Unremarkable paranasal sinuses. 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 IMPRESSION: No acute abnormality. 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 Electronically Signed   By: Claudie Revering M.D.   On: 03/19/2018 14:01   Dg Chest Port 1 View  Result Date: 03/22/2018 CLINICAL DATA:  Altered mental status. EXAM: PORTABLE CHEST 1 VIEW COMPARISON:  10/30/2015 and prior radiographs  FINDINGS: The cardiomediastinal silhouette is unremarkable. There is no evidence of focal airspace disease, pulmonary edema, suspicious pulmonary nodule/mass, pleural effusion, or pneumothorax. No acute bony  abnormalities are identified. Chronic changes of the RIGHT humeral head again noted. IMPRESSION: No active disease. Electronically Signed   By: Margarette Canada M.D.   On: 04/14/2018 13:54    Assessment and Plan:   1. Elevated troponin: patient presented after friend found her in an altered state at home and activated EMS. Patient is unable to participate in history so unclear if any complaints of chest pain. No prior heart disease history noted. Trop 3.04. EKG with sinus rhythm with submm STE in I and V3, and TWI in III, aVF, and V4-6 (new from previous EKG). Echo 2018 with EF 55-60%, mild LVH, no wall motion abnormalities, and normal LV diastolic function. No prior ischemic evaluation on chart review. CT Chest in 2014 notes extensive atherosclerotic irregularities within the aorta and coronary arteries. Unfortunately patient is a poor cath candidate at this time given AMS, sepsis, and rhabdo.  - Will manage conservatively at this time - will start heparin gtt for 48 hours.  - Will check an echocardiogram to assess LV function and wall motion  2. Sepsis: started on IV antibiotics - Continue management per primary team.     Signed, Abigail Butts, PA-C  03/24/2018 3:46 PM [phone]   For questions or updates, please contact [place] Please consult [email or web address] for contact info under Cardiology/STEMI.

## 2018-04-08 NOTE — ED Notes (Signed)
This RN and another nurse attempted in and out cath; no urine obtained; MD aware and present for in and out cath

## 2018-04-08 NOTE — ED Provider Notes (Signed)
[facility] EMERGENCY DEPARTMENT Provider Note   CSN: [id number] Arrival date & time: 04/03/2018  1309     History   Chief Complaint Chief Complaint  Patient presents with  . Altered Mental Status    HPI Teresa Schneider is a 72 y.o. female.  Level 5 caveat due to altered mental status, history is obtained from friend.  Patient with history of alcohol abuse, psychiatric disorder who presents to the ED with altered mental status.  According to friend patient has had decline over the last 2 days.  She lives next door and one bite to see the patient today and found her sitting on a chair confused.  Last time that she saw her was yesterday.  Patient is able to usually take care of herself, talk.  She has history of alcohol abuse as well as psychiatric illness.  Otherwise patient unable to give any history.  The history is provided by the patient.  Altered Mental Status   This is a new problem. The current episode started 12 to 24 hours ago. The problem has not changed since onset.Associated symptoms include confusion. Risk factors include alcohol intake (possible ETOH use). Her past medical history is significant for liver disease.    Past Medical History:  Diagnosis Date  . ADD (attention deficit disorder)   . Alcoholic hepatitis   . Anemia    low iron  . Ascites   . Depression   . Palpitations   . Sleep apnea    does not use cpap    Patient Active Problem List   Diagnosis Date Noted  . Bilateral carotid artery disease ([place]) 11/30/2017  . Thrombocytopenia ([place]) 12/22/2016  . History of COPD 11/11/2015  . History of pneumonia 10/25/2015  . History of breast cancer 03/28/2012  . History of smoking 03/28/2012  . Alcoholic liver disease ([place]) 03/28/2012  . Attention deficit disorder 03/28/2012    Past Surgical History:  Procedure Laterality Date  . COLONOSCOPY    . FRACTURE SURGERY Right    wrist  . JOINT REPLACEMENT Right    hip  . MASTECTOMY  Bilateral    2 different times     OB History   None      Home Medications    Prior to Admission medications   Medication Sig Start Date End Date Taking? Authorizing Provider  aspirin EC 81 MG tablet Take 81 mg by mouth daily.    [provider]  Calcium Carbonate-Vit D-Min (CALCIUM 1200 PO) Take 1,200 mg by mouth daily.    [provider]  celecoxib (CELEBREX) 200 MG capsule Take 1 capsule (200 mg total) by mouth daily. 11/30/17   Elby Showers, MD  compounded topicals builder Apply 1 application topically. [place], [place]    [provider]  dextroamphetamine (DEXEDRINE SPANSULE) 15 MG 24 hr capsule Take 15 mg by mouth 2 (two) times daily. Reported on 10/18/2015    [provider]  furosemide (LASIX) 20 MG tablet Take 1 tablet by mouth every other day 11/30/17   Elby Showers, MD  ibandronate (BONIVA) 150 MG tablet One po monthly on empty stomach. 09/30/17   Elby Showers, MD  losartan (COZAAR) 25 MG tablet TAKE 1 TABLET EVERY DAY 11/08/17   Elby Showers, MD  Multiple Vitamins-Minerals (CENTRUM SILVER 50+WOMEN PO) Take by mouth.    [provider]  mupirocin ointment (BACTROBAN) 2 % Place 1 application into the nose 2 (two) times daily. 06/14/17  Elby Showers, MD  predniSONE (DELTASONE) 10 MG tablet Take in tapering dose 5-4-3-2-1 11/30/17   Elby Showers, MD  rosuvastatin (CRESTOR) 5 MG tablet TAKE 1 TABLET EVERY DAY 02/16/18   Elby Showers, MD  Vitamin D, Ergocalciferol, (DRISDOL) 50000 units CAPS capsule Take 50,000 Units by mouth every 7 (seven) days.    [provider]    Family History Family History  Problem Relation Age of Onset  . Suicidality Sister   . Glaucoma Mother   . Depression Mother   . Cancer Father     Social History Social History   Tobacco Use  . Smoking status: Current Every Day Smoker    Packs/day: 0.75    Years: 35.00    Pack years: 26.25    Types: Cigarettes    Last attempt to quit:  09/26/2016    Years since quitting: 1.5  . Smokeless tobacco: Never Used  . Tobacco comment: TRYING TO QUIT, SMOKES "LESS THAN A PACK A DAY"   Substance Use Topics  . Alcohol use: Yes    Comment: 2-3 oz four times a week   . Drug use: No     Allergies   Salmon [fish allergy]; Other; Sulfamethoxazole-trimethoprim; Penicillins; and Pyridium [phenazopyridine hcl]   Review of Systems Review of Systems  Unable to perform ROS: Mental status change  Psychiatric/Behavioral: Positive for confusion.     Physical Exam Updated Vital Signs  ED Triage Vitals  Enc Vitals Group     BP 04/14/2018 1326 (!) 175/92     Pulse Rate 04/07/2018 1326 94     Resp 04/07/2018 1326 (!) 24     Temp 03/28/2018 1326 98.4 F (36.9 C)     Temp Source 03/26/2018 1326 Rectal     SpO2 04/10/2018 1326 98 %     Weight 03/22/2018 1329 113 lb 15.7 oz (51.7 kg)     Height 03/28/2018 1329 5' (1.524 m)     Head Circumference --      Peak Flow --      Pain Score --      Pain Loc --      Pain Edu? --      Excl. in [place]? --     Physical Exam  Constitutional: She appears distressed.  HENT:  Head: Normocephalic and atraumatic.  Mouth/Throat: No oropharyngeal exudate.  Eyes: Pupils are equal, round, and reactive to light. Conjunctivae and EOM are normal.  Neck: Normal range of motion. Neck supple.  Cardiovascular: Normal rate, regular rhythm, normal heart sounds and intact distal pulses.  No murmur heard. Pulmonary/Chest: Effort normal and breath sounds normal. No respiratory distress.  Abdominal: Soft. Bowel sounds are normal. She exhibits no distension. There is no tenderness.  Musculoskeletal: Normal range of motion. She exhibits no edema or tenderness.  Neurological: She is alert.  Patient is alert, follows commands, grossly normal strength, opens her eyes spontaneously but will not talk to me, cranial nerves appear intact  Skin: Skin is warm and dry. Capillary refill takes less than 2 seconds.  Psychiatric: She has a  normal mood and affect.  Nursing note and vitals reviewed.    ED Treatments / Results  Labs (all labs ordered are listed, but only abnormal results are displayed) Labs Reviewed  COMPREHENSIVE METABOLIC PANEL - Abnormal; Notable for the following components:      Result Value   CO2 17 (*)    Glucose, Bld 144 (*)    AST 108 (*)  Alkaline Phosphatase 131 (*)    Total Bilirubin 5.0 (*)    All other components within normal limits  CBC WITH DIFFERENTIAL/PLATELET - Abnormal; Notable for the following components:   WBC 19.0 (*)    HCT 46.3 (*)    RDW 16.2 (*)    Neutro Abs 15.0 (*)    Monocytes Absolute 1.6 (*)    Abs Immature Granulocytes 0.10 (*)    All other components within normal limits  URINALYSIS, COMPLETE (UACMP) WITH MICROSCOPIC - Abnormal; Notable for the following components:   Glucose, UA 50 (*)    Hgb urine dipstick MODERATE (*)    Protein, ur >=300 (*)    Bacteria, UA RARE (*)    All other components within normal limits  AMMONIA - Abnormal; Notable for the following components:   Ammonia 53 (*)    All other components within normal limits  CK - Abnormal; Notable for the following components:   Total CK 1,223 (*)    All other components within normal limits  PROTIME-INR - Abnormal; Notable for the following components:   Prothrombin Time 17.8 (*)    All other components within normal limits  CBG MONITORING, ED - Abnormal; Notable for the following components:   Glucose-Capillary 128 (*)    All other components within normal limits  I-STAT CG4 LACTIC ACID, ED - Abnormal; Notable for the following components:   Lactic Acid, Venous 4.29 (*)    All other components within normal limits  I-STAT TROPONIN, ED - Abnormal; Notable for the following components:   Troponin i, poc 3.04 (*)    All other components within normal limits  CULTURE, BLOOD (ROUTINE X 2)  CULTURE, BLOOD (ROUTINE X 2)  ETHANOL  LIPASE, BLOOD  RAPID URINE DRUG SCREEN, HOSP PERFORMED  I-STAT  CG4 LACTIC ACID, ED    EKG EKG Interpretation  Date/Time:  Friday April 08 2018 14:01:38 EST Ventricular Rate:  93 PR Interval:    QRS Duration: 81 QT Interval:  421 QTC Calculation: 524 R Axis:   61 Text Interpretation:  Sinus rhythm Atrial premature complexes Short PR interval Biatrial enlargement new t-wave inversions  laterally, III Confirmed by Lennice Sites [phone]) on 03/29/2018 2:10:13 PM   Radiology Ct Head Wo Contrast  Result Date: 03/23/2018 CLINICAL DATA:  Altered mental status. EXAM: CT HEAD WITHOUT CONTRAST TECHNIQUE: Contiguous axial images were obtained from the base of the skull through the vertex without intravenous contrast. COMPARISON:  None. FINDINGS: Brain: Normal appearing cerebral hemispheres and posterior fossa structures. Normal size and position of the ventricles. No intracranial hemorrhage, mass lesion or CT evidence of acute infarction. Vascular: No hyperdense vessel or unexpected calcification. Skull: Normal. Negative for fracture or focal lesion. Sinuses/Orbits: Status post bilateral cataract extraction. Unremarkable paranasal sinuses. 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 IMPRESSION: No acute abnormality. 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 Electronically Signed   By: Claudie Revering M.D.   On: 04/11/2018 14:01   Ct Abdomen Pelvis W Contrast  Result Date: 03/29/2018 CLINICAL DATA:  Abdominal distention, altered mentation and lethargy. History of alcoholism. EXAM: CT ABDOMEN AND PELVIS WITH CONTRAST TECHNIQUE: Multidetector CT imaging of the abdomen and pelvis was performed using the standard protocol following bolus administration of intravenous contrast. CONTRAST:  142mL OMNIPAQUE IOHEXOL 300 MG/ML  SOLN COMPARISON:  07/11/2007 FINDINGS: Lower chest: Cardiomegaly without pericardial effusion or thickening. Bibasilar dependent atelectasis is noted. Further assessment is somewhat  limited by respiratory motion artifacts. Small paraesophageal varices with thickened  distal esophagus redemonstrated. Hepatobiliary: Surface nodularity of the liver consistent with morphologic changes of cirrhosis. Tiny too small to characterize hypodensities are noted within the left and right hepatic lobes too small to further characterize but may reflect cysts or hemangiomata. Gallbladder is non thickened but contains layering dependent calculi. There is a 3 mm calculus also noted in the distal common bile duct, series 3/28 without significant pancreatic nor extrahepatic biliary dilatation noted. Correlate with liver function tests there has been some interval development portal varices. Pancreas: Atrophic pancreas without inflammation. Spleen: The spleen measures 12.1 x 4.8 x 9.7 cm (volume = 290 cm^3), normal in size and is without focal mass. Adrenals/Urinary Tract: Normal bilateral adrenal glands and kidneys. No nephrolithiasis, renal mass nor obstructive uropathy. The urinary bladder is partially obscured by the patient's right hip arthroplasty streak artifacts. No focal mural thickening, calculus or mass is identified of the bladder. Stomach/Bowel: Decompressed stomach. No small nor large bowel obstruction or inflammation. The appendix is not confidently identified but no pericecal inflammation is seen. Vascular/Lymphatic: Aortoiliac and branch vessel atherosclerosis without aneurysm or adenopathy. Reproductive: Atrophic uterus. No adnexal mass. Mild engorgement of the left gonadal vein. Other: No ascites or free air. Musculoskeletal: Right hip arthroplasty. Chronic moderate T11 and mild T12 compression deformities. No acute osseous abnormality. IMPRESSION: 1. 3 mm calculus in the distal common bile duct without significant pancreatic or extrahepatic biliary dilatation. Correlate with liver function tests. Numerous gallstones are noted within the physiologically distended gallbladder. 2. Surface nodularity  of the liver consistent with morphologic changes of cirrhosis. Tiny too small to further characterize hypodensities in the left and right hepatic lobes but statistically more likely to represent cysts or hemangiomata. 3. Chronic mild esophageal thickening along the distal aspect. Esophagitis is not excluded. 4. Small paraesophageal and periportal varices. 5. Aortoiliac and branch vessel atherosclerosis. 6. Chronic moderate T11 and mild T12 compression deformities. Electronically Signed   By: Ashley Royalty M.D.   On: 03/26/2018 15:54   Dg Chest Port 1 View  Result Date: 03/20/2018 CLINICAL DATA:  Altered mental status. EXAM: PORTABLE CHEST 1 VIEW COMPARISON:  10/30/2015 and prior radiographs FINDINGS: The cardiomediastinal silhouette is unremarkable. There is no evidence of focal airspace disease, pulmonary edema, suspicious pulmonary nodule/mass, pleural effusion, or pneumothorax. No acute bony abnormalities are identified. Chronic changes of the RIGHT humeral head again noted. IMPRESSION: No active disease. Electronically Signed   By: Margarette Canada M.D.   On: 03/30/2018 13:54    Procedures .Critical Care Performed by: Lennice Sites, DO Authorized by: Lennice Sites, DO   Critical care provider statement:    Critical care time (minutes):  65   Critical care was necessary to treat or prevent imminent or life-threatening deterioration of the following conditions:  Sepsis   Critical care was time spent personally by me on the following activities:  Blood draw for specimens, development of treatment plan with patient or surrogate, discussions with consultants, discussions with primary provider, evaluation of patient's response to treatment, examination of patient, obtaining history from patient or surrogate, ordering and performing treatments and interventions, ordering and review of laboratory studies, ordering and review of radiographic studies, pulse oximetry, re-evaluation of patient's condition and  review of old charts   I assumed direction of critical care for this patient from another provider in my specialty: no     (including critical care time)  Medications Ordered in ED Medications  lactated ringers bolus 1,000 mL (1,000 mLs Intravenous New Bag/Given 04/06/2018 1620)  vancomycin (  VANCOCIN) IVPB 1000 mg/200 mL premix (has no administration in time range)  ceFEPIme (MAXIPIME) 2 g in sodium chloride 0.9 % 100 mL IVPB (has no administration in time range)  vancomycin (VANCOCIN) 500 mg in sodium chloride 0.9 % 100 mL IVPB (has no administration in time range)  metroNIDAZOLE (FLAGYL) IVPB 500 mg (has no administration in time range)  lactated ringers bolus 1,000 mL (0 mLs Intravenous Stopped 04/09/2018 1509)  ceFEPIme (MAXIPIME) 2 g in sodium chloride 0.9 % 100 mL IVPB (2 g Intravenous New Bag/Given 03/24/2018 1618)  aspirin suppository 300 mg (300 mg Rectal Given 03/28/2018 1621)  iohexol (OMNIPAQUE) 300 MG/ML solution 100 mL (100 mLs Intravenous Contrast Given 04/14/2018 1526)  morphine 2 MG/ML injection 2 mg (2 mg Intravenous Given 03/23/2018 1608)  ondansetron (ZOFRAN) injection 4 mg (4 mg Intravenous Given 04/02/2018 1607)     Initial Impression / Assessment and Plan / ED Course  I have reviewed the triage vital signs and the nursing notes.  Pertinent labs & imaging results that were available during my care of the patient were reviewed by me and considered in my medical decision making (see chart for details).     Teresa Schneider is a 72 year old female with history of depression, alcoholic hepatitis who presents to the ED with altered mental status.  Patient with mild hypertension, tachycardia upon arrival but no fever.  Patient with confusion upon my examination.  Patient arrives with friend who states that patient has been acting increasingly confused over the last 2 days.  She found her today with increased confusion while sitting on her chair today at home.  Patient is nonverbal on  exam.  She moves all of her extremities.  She appears to follow commands.  No obvious neurological finding focally.  She appears to have some abdominal tenderness may be on examination but overall difficult to assess.  Patient supposedly was a heavy alcoholic.  Differential is wide including infectious process, trauma, dehydration, polypharmacy, alcohol abuse.  Patient with no fever rectally.  Initial evaluation included CT scan of head, EKG, ammonia, CBC, CMP, lipase, CK.  Patient given LR bolus.  Patient with EKG that shows deep T wave inversions in the lateral leads as well as T wave inversions in the inferior leads.  These are new from prior.  Unable to assess if patient has chest pain.  Troponin was ordered and was elevated to 3.  Cardiology was consulted and will come down to the ED to evaluate the patient for recommendations.  However clinical picture is complicated as patient also with elevated bilirubin to 5, elevated AST to 108 but otherwise normal lipase, normal ALT.  Patient with leukocytosis of 19.  CK of 1200 and ammonia 53.  At this time lactic acid and CT of abdomen and pelvis was obtained as well and patient was empirically started on IV vancomycin, cefepime, Flagyl as concern for possible cholangitis type picture.  Lactic acid was elevated to 4 and patient was given an additional fluid bolus to make 30 cc/kg.  Code sepsis was initiated and blood cultures were collected.  Patient with normal vitals throughout my care however.  CT scan showed common bile duct stone at 3 mm with no obvious inflammation or intrahepatic dilation.  There are multiple gallstones within the gallbladder.  Overall patient appears to be likely septic from gallbladder process, possibly cholangitis type picture.  Patient to be admitted to hospitalist service for further care.  Patient with elevated troponin and EKG changes possibly  from septic process.  Cardiology consulted for further care.  Dr. Paulita Fujita with GI was made  aware of the patient and will also come by to make recommendations.  Patient remained hemodynamically stable throughout my care.  This chart was dictated using voice recognition software.  Despite best efforts to proofread,  errors can occur which can change the documentation meaning.   Final Clinical Impressions(s) / ED Diagnoses   Final diagnoses:  Sepsis, due to unspecified organism, unspecified whether acute organ dysfunction present [facility])  Choledocholithiasis  Elevated troponin  Elevated CK  Non-traumatic rhabdomyolysis    ED Discharge Orders    None       Lennice Sites, DO 04/14/2018 1657

## 2018-04-08 NOTE — Progress Notes (Signed)
CRITICAL VALUE ALERT  Critical Value:  Lactic Acid 2.4  Date & Time Notified: 03/24/2018 2200hrs  Provider Notified: [place]  Orders Received/Actions taken: Awaiting Orders.

## 2018-04-08 NOTE — Telephone Encounter (Signed)
Pt resides in garage apt. on property where Dr. Janet [place] resides. Dr. Manus Rudd is on DPR. Spoke with Dr. Manus Rudd this evening who took pt to hospital earlier today. She understands pt is critically ill. Says pt has brother, Cerinity Zynda, and she is trying to get in touch with him.

## 2018-04-08 NOTE — Progress Notes (Signed)
Pharmacy Antibiotic Note  Teresa Schneider is a 72 y.o. female admitted on 03/29/2018 with sepsis.  Pharmacy has been consulted for vancomycin and cefepime dosing. Pt is afebrile and WBC is elevated at 19. SCr is WNL and lactic acid is significantly elevated at 4.29.   Plan: Vancomycin 1gm IV x 1 then 500mg  IV Q12H Cefepime 2gm IV Q24H F/u renal fxn, C&S, clinical status and trough at SS  Height: 5' (152.4 cm) Weight: 113 lb 15.7 oz (51.7 kg) IBW/kg (Calculated) : 45.5  Temp (24hrs), Avg:98.4 F (36.9 C), Min:98.4 F (36.9 C), Max:98.4 F (36.9 C)  Recent Labs  Lab 03/18/2018 1358 04/01/2018 1451  WBC 19.0*  --   CREATININE 0.81  --   LATICACIDVEN  --  4.29*    Estimated Creatinine Clearance: 46.4 mL/min (by C-G formula based on SCr of 0.81 mg/dL).    Allergies  Allergen Reactions  . Salmon [Fish Allergy] Dermatitis  . Other Dermatitis    Paprika  . Sulfamethoxazole-Trimethoprim     REACTION: rash  . Penicillins Rash  . Pyridium [Phenazopyridine Hcl] Rash    Antimicrobials this admission: Vanc 11/22>> Cefepime 11/22>>  Dose adjustments this admission: N/A  Microbiology results: Pending  Thank you for allowing pharmacy to be a part of this patient's care.  Nyleah Mcginnis, Rande Lawman 04/12/2018 3:23 PM

## 2018-04-08 NOTE — ED Triage Notes (Signed)
Pt from home; brought in by friend, who noticed decline in pt over past few days; per pt's friend, pt normally alert and oriented, ambulatory; today pt observed to be non-verbal, lethargic; per pt's friend, pt has hx of alcoholism

## 2018-04-09 ENCOUNTER — Other Ambulatory Visit: Payer: Self-pay

## 2018-04-09 ENCOUNTER — Inpatient Hospital Stay (HOSPITAL_COMMUNITY): Payer: Medicare HMO

## 2018-04-09 DIAGNOSIS — E440 Moderate protein-calorie malnutrition: Secondary | ICD-10-CM

## 2018-04-09 DIAGNOSIS — I361 Nonrheumatic tricuspid (valve) insufficiency: Secondary | ICD-10-CM

## 2018-04-09 DIAGNOSIS — R4182 Altered mental status, unspecified: Secondary | ICD-10-CM

## 2018-04-09 LAB — HEPARIN LEVEL (UNFRACTIONATED)
HEPARIN UNFRACTIONATED: 0.13 [IU]/mL — AB (ref 0.30–0.70)
Heparin Unfractionated: 0.35 IU/mL (ref 0.30–0.70)

## 2018-04-09 LAB — COMPREHENSIVE METABOLIC PANEL
ALK PHOS: 118 U/L (ref 38–126)
ALT: 38 U/L (ref 0–44)
ANION GAP: 10 (ref 5–15)
AST: 95 U/L — ABNORMAL HIGH (ref 15–41)
Albumin: 3.2 g/dL — ABNORMAL LOW (ref 3.5–5.0)
BILIRUBIN TOTAL: 4.4 mg/dL — AB (ref 0.3–1.2)
BUN: 18 mg/dL (ref 8–23)
CO2: 20 mmol/L — ABNORMAL LOW (ref 22–32)
Calcium: 8.3 mg/dL — ABNORMAL LOW (ref 8.9–10.3)
Chloride: 106 mmol/L (ref 98–111)
Creatinine, Ser: 0.74 mg/dL (ref 0.44–1.00)
GFR calc Af Amer: >60 mL/min (ref >60)
GLUCOSE: 106 mg/dL — AB (ref 70–99)
POTASSIUM: 3.4 mmol/L — AB (ref 3.5–5.1)
Sodium: 136 mmol/L (ref 135–145)
TOTAL PROTEIN: 6.4 g/dL — AB (ref 6.5–8.1)

## 2018-04-09 LAB — CBC
HEMATOCRIT: 41.5 % (ref 36.0–46.0)
Hemoglobin: 13.8 g/dL (ref 12.0–15.0)
MCH: 31.8 pg (ref 26.0–34.0)
MCHC: 33.3 g/dL (ref 30.0–36.0)
MCV: 95.6 fL (ref 80.0–100.0)
NRBC: 0.2 % (ref 0.0–0.2)
PLATELETS: UNDETERMINED 10*3/uL (ref 150–400)
RBC: 4.34 MIL/uL (ref 3.87–5.11)
RDW: 15.9 % — ABNORMAL HIGH (ref 11.5–15.5)
WBC: 19.2 10*3/uL — AB (ref 4.0–10.5)

## 2018-04-09 LAB — MAGNESIUM: MAGNESIUM: 1.5 mg/dL — AB (ref 1.7–2.4)

## 2018-04-09 LAB — AMMONIA: AMMONIA: 80 umol/L — AB (ref 9–35)

## 2018-04-09 LAB — ECHOCARDIOGRAM COMPLETE
Height: 65 in
Weight: 1689.61 [oz_av]

## 2018-04-09 LAB — CK: Total CK: 460 U/L — ABNORMAL HIGH (ref 38–234)

## 2018-04-09 LAB — HIV ANTIBODY (ROUTINE TESTING W REFLEX): HIV Screen 4th Generation wRfx: NONREACTIVE

## 2018-04-09 LAB — TROPONIN I: Troponin I: 1.5 ng/mL (ref <0.03)

## 2018-04-09 MED ORDER — LACTULOSE ENEMA
300.0000 mL | Freq: Every day | ORAL | Status: DC
  Filled 2018-04-09: qty 300

## 2018-04-09 MED ORDER — POTASSIUM CHLORIDE 10 MEQ/100ML IV SOLN
10.0000 meq | INTRAVENOUS | Status: AC
  Administered 2018-04-09 (×2): 10 meq via INTRAVENOUS
  Filled 2018-04-09 (×3): qty 100

## 2018-04-09 MED ORDER — LACTULOSE ENEMA
300.0000 mL | Freq: Two times a day (BID) | ORAL | Status: DC
  Administered 2018-04-09 – 2018-04-10 (×3): 300 mL via RECTAL
  Filled 2018-04-09 (×4): qty 300

## 2018-04-09 MED ORDER — POTASSIUM CHLORIDE 10 MEQ/100ML IV SOLN: 10.0000 meq | INTRAVENOUS | Status: DC

## 2018-04-09 NOTE — Plan of Care (Signed)
  Problem: Elimination: Goal: Will not experience complications related to urinary retention Outcome: Progressing   Problem: Education: Goal: Knowledge of General Education information will improve Description Including pain rating scale, medication(s)/side effects and non-pharmacologic comfort measures Outcome: Not Progressing   Problem: Health Behavior/Discharge Planning: Goal: Ability to manage health-related needs will improve Outcome: Not Progressing   Problem: Activity: Goal: Risk for activity intolerance will decrease Outcome: Not Progressing   Problem: Nutrition: Goal: Adequate nutrition will be maintained Outcome: Not Progressing   Problem: Coping: Goal: Level of anxiety will decrease Outcome: Not Progressing   Problem: Safety: Goal: Ability to remain free from injury will improve Outcome: Not Progressing

## 2018-04-09 NOTE — Progress Notes (Signed)
We have been asked to see this patient, whom I know from outpatient care in my office.  Initial chart review done.  I will be seeing the patient later today; please call me if you would like to discuss her case in the meantime, or if there is need for more immediate input.  Cleotis Nipper, M.D. Pager [phone] If no answer or after 5 PM call [phone]

## 2018-04-09 NOTE — Consult Note (Signed)
Referring Provider:  Dr. Barth Kirks Primary Care Physician:  Elby Showers, MD Primary Gastroenterologist:  Dr. Cristina Gong  Reason for Consultation: Altered mental status, common bile duct stone  HPI: Teresa Schneider is a 72 y.o. female admitted through the emergency room yesterday with marked alteration of mental status.  The patient has a long-standing history of alcohol abuse.  She is known to me from office management of chronic alcoholic liver disease but she was most recently seen by me 2-1/2 years ago and canceled her most recent appointment.  At the time I last saw her, she was having 4 shots of vodka nightly.  No friends or family members are available at present to obtain a history from, so the patient's recent alcohol intake is unknown.  Previous manifestations of alcohol abuse per primarily alcoholic hepatitis with hepatomegaly, mild ascites, and dyspeptic symptomatology.  The patient has never previously had hepatic encephalopathy, at least that I am aware of.  On presentation to the emergency room yesterday, the patient was noted to have mild elevation of her AST level (95), with elevation of bilirubin to 4.4.  AST was normal, alk phos was normal.  This pattern is very suggestive of alcohol related liver inflammation rather than cholangitis.  Although the CT scan on admission did show a stone in the distal common duct, there was no biliary ductal dilatation.   Past Medical History:  Diagnosis Date  . ADD (attention deficit disorder)   . Alcoholic hepatitis   . Anemia    low iron  . Ascites   . Depression   . Palpitations   . Sleep apnea    does not use cpap    Past Surgical History:  Procedure Laterality Date  . COLONOSCOPY    . FRACTURE SURGERY Right    wrist  . JOINT REPLACEMENT Right    hip  . MASTECTOMY Bilateral    2 different times    Prior to Admission medications   Medication Sig Start Date End Date Taking? Authorizing Provider  celecoxib (CELEBREX) 200  MG capsule Take 1 capsule (200 mg total) by mouth daily. 11/30/17  Yes Baxley, Cresenciano Lick, MD  dextroamphetamine (DEXTROSTAT) 10 MG tablet Take 10 mg by mouth 4 (four) times daily. 02/22/18  Yes [provider]  furosemide (LASIX) 20 MG tablet Take 1 tablet by mouth every other day 11/30/17  Yes Baxley, Cresenciano Lick, MD  ibandronate (BONIVA) 150 MG tablet One po monthly on empty stomach. Patient taking differently: Take 150 mg by mouth every 30 (thirty) days.  09/30/17  Yes Baxley, Cresenciano Lick, MD  losartan (COZAAR) 25 MG tablet TAKE 1 TABLET EVERY DAY Patient taking differently: Take 25 mg by mouth daily.  11/08/17  Yes Baxley, Cresenciano Lick, MD  rosuvastatin (CRESTOR) 5 MG tablet TAKE 1 TABLET EVERY DAY Patient taking differently: Take 5 mg by mouth daily.  02/16/18  Yes Baxley, Cresenciano Lick, MD  tiZANidine (ZANAFLEX) 4 MG tablet Take 12 mg by mouth at bedtime.  03/09/18  Yes [provider]  aspirin EC 81 MG tablet Take 81 mg by mouth daily.    [provider]  Calcium Carbonate-Vit D-Min (CALCIUM 1200 PO) Take 1,200 mg by mouth daily.    [provider]  Multiple Vitamins-Minerals (CENTRUM SILVER 50+WOMEN PO) Take by mouth.    [provider]  mupirocin ointment (BACTROBAN) 2 % Place 1 application into the nose 2 (two) times daily. Patient not taking: Reported on 04/09/2018 06/14/17   Tedra Senegal  J, MD  Vitamin D, Ergocalciferol, (DRISDOL) 50000 units CAPS capsule Take 50,000 Units by mouth every 7 (seven) days.    [provider]    Current Facility-Administered Medications  Medication Dose Route Frequency Provider Last Rate Last Dose  . 0.9 %  sodium chloride infusion   Intravenous Continuous Guilford Shi, MD 100 mL/hr at 04/09/18 1900    . acetaminophen (TYLENOL) tablet 650 mg  650 mg Oral Q6H PRN Guilford Shi, MD       Or  . acetaminophen (TYLENOL) suppository 650 mg  650 mg Rectal Q6H PRN Guilford Shi, MD      . aspirin suppository 300 mg  300 mg  Rectal Daily Kamineni, Neelima, MD      . ceFEPIme (MAXIPIME) 2 g in sodium chloride 0.9 % 100 mL IVPB  2 g Intravenous Q24H Guilford Shi, MD 200 mL/hr at 04/09/18 1902 2 g at 04/09/18 1902  . heparin ADULT infusion 100 units/mL (25000 units/242m sodium chloride 0.45%)  900 Units/hr Intravenous Continuous Matcha, Anupama, MD 9 mL/hr at 04/09/18 1612 900 Units/hr at 04/09/18 1612  . lactulose (CHRONULAC) enema 200 gm  300 mL Rectal BID Matcha, Anupama, MD   300 mL at 04/09/18 2110  . ondansetron (ZOFRAN) tablet 4 mg  4 mg Oral Q6H PRN KGuilford Shi MD       Or  . ondansetron (ZOFRAN) injection 4 mg  4 mg Intravenous Q6H PRN Kamineni, Neelima, MD      . thiamine (B-1) injection 100 mg  100 mg Intravenous Daily KGuilford Shi MD   100 mg at 04/09/18 0945    Allergies as of 04/02/2018 - Review Complete 04/14/2018  Allergen Reaction Noted  . Salmon [fish allergy] Dermatitis 04/04/2012  . Other Dermatitis 04/04/2012  . Sulfamethoxazole-trimethoprim  08/28/2009  . Penicillins Rash 06/14/2017  . Pyridium [phenazopyridine hcl] Rash 12/20/2014    Family History  Problem Relation Age of Onset  . Suicidality Sister   . Glaucoma Mother   . Depression Mother   . Cancer Father     Social History   Socioeconomic History  . Marital status: Single    Spouse name: Not on file  . Number of children: Not on file  . Years of education: Not on file  . Highest education level: Not on file  Occupational History  . Not on file  Social Needs  . Financial resource strain: Not on file  . Food insecurity:    Worry: Not on file    Inability: Not on file  . Transportation needs:    Medical: Not on file    Non-medical: Not on file  Tobacco Use  . Smoking status: Current Every Day Smoker    Packs/day: 0.75    Years: 35.00    Pack years: 26.25    Types: Cigarettes    Last attempt to quit: 09/26/2016    Years since quitting: 1.5  . Smokeless tobacco: Never Used  . Tobacco comment:  TRYING TO QUIT, SMOKES "LESS THAN A PACK A DAY"   Substance and Sexual Activity  . Alcohol use: Yes    Comment: 2-3 oz four times a week   . Drug use: No  . Sexual activity: Never  Lifestyle  . Physical activity:    Days per week: Not on file    Minutes per session: Not on file  . Stress: Not on file  Relationships  . Social connections:    Talks on phone: Not on file    Gets  together: Not on file    Attends religious service: Not on file    Active member of club or organization: Not on file    Attends meetings of clubs or organizations: Not on file    Relationship status: Not on file  . Intimate partner violence:    Fear of current or ex partner: Not on file    Emotionally abused: Not on file    Physically abused: Not on file    Forced sexual activity: Not on file  Other Topics Concern  . Not on file  Social History Narrative  . Not on file    Review of Systems: Unobtainable  Physical Exam: Vital signs in last 24 hours: Temp:  [97.4 F (36.3 C)-98.3 F (36.8 C)] 98.3 F (36.8 C) (11/23 2133) Pulse Rate:  [83-92] 88 (11/23 2009) Resp:  [13-27] 17 (11/23 2009) BP: (146-180)/(78-87) 165/80 (11/23 2009) SpO2:  [91 %-97 %] 97 % (11/23 2133) Last BM Date: 03/25/2018  This is a thin, perhaps malnourished elderly Caucasian female writhing around in bed, nonverbal.  She is anicteric.  There is no overt pallor.  She is not in active distress, just restless.  Chest is clear, heart without murmur or arrhythmia, abdomen without overt mass or tenderness (in the past, during active drinking, she would sometimes have a palpable left lobe of liver, but I did not appreciate that today).  No overt ascites, no peripheral edema.  She is, as mentioned above, nonverbal and completely incoherent.  Intake/Output from previous day: 11/22 0701 - 11/23 0700 In: 2973.7 [I.V.:223.5; IV Piggyback:2750.2] Out: 9450 [Urine:1425] Intake/Output this shift: No intake/output data recorded.  Lab  Results: Recent Labs    04/03/2018 1358 04/09/18 0234  WBC 19.0* 19.2*  HGB 14.6 13.8  HCT 46.3* 41.5  PLT PLATELET CLUMPS NOTED ON SMEAR, UNABLE TO ESTIMATE PLATELET CLUMPS NOTED ON SMEAR, UNABLE TO ESTIMATE   BMET Recent Labs    04/11/2018 1358 04/09/18 0234  NA 140 136  K 3.6 3.4*  CL 111 106  CO2 17* 20*  GLUCOSE 144* 106*  BUN 21 18  CREATININE 0.81 0.74  CALCIUM 9.3 8.3*   LFT Recent Labs    04/09/18 0234  PROT 6.4*  ALBUMIN 3.2*  AST 95*  ALT 38  ALKPHOS 118  BILITOT 4.4*   PT/INR Recent Labs    03/24/2018 1548  LABPROT 17.8*  INR 1.48    Studies/Results: Ct Head Wo Contrast  Result Date: 04/16/2018 CLINICAL DATA:  Altered mental status. EXAM: CT HEAD WITHOUT CONTRAST TECHNIQUE: Contiguous axial images were obtained from the base of the skull through the vertex without intravenous contrast. COMPARISON:  None. FINDINGS: Brain: Normal appearing cerebral hemispheres and posterior fossa structures. Normal size and position of the ventricles. No intracranial hemorrhage, mass lesion or CT evidence of acute infarction. Vascular: No hyperdense vessel or unexpected calcification. Skull: Normal. Negative for fracture or focal lesion. Sinuses/Orbits: Status post bilateral cataract extraction. Unremarkable paranasal sinuses. 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 IMPRESSION: No acute abnormality. 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 Electronically Signed   By: Claudie Revering M.D.   On: 04/14/2018 14:01   Ct Abdomen Pelvis W Contrast  Result Date: 03/25/2018 CLINICAL DATA:  Abdominal distention, altered mentation and lethargy. History of alcoholism. EXAM: CT ABDOMEN AND PELVIS WITH CONTRAST TECHNIQUE: Multidetector CT imaging of the abdomen and pelvis was performed using the standard protocol following bolus administration of intravenous contrast. CONTRAST:  111m OMNIPAQUE IOHEXOL 300 MG/ML  SOLN  COMPARISON:  07/11/2007 FINDINGS: Lower chest: Cardiomegaly without pericardial effusion or thickening. Bibasilar dependent atelectasis is noted. Further assessment is somewhat limited by respiratory motion artifacts. Small paraesophageal varices with thickened distal esophagus redemonstrated. Hepatobiliary: Surface nodularity of the liver consistent with morphologic changes of cirrhosis. Tiny too small to characterize hypodensities are noted within the left and right hepatic lobes too small to further characterize but may reflect cysts or hemangiomata. Gallbladder is non thickened but contains layering dependent calculi. There is a 3 mm calculus also noted in the distal common bile duct, series 3/28 without significant pancreatic nor extrahepatic biliary dilatation noted. Correlate with liver function tests there has been some interval development portal varices. Pancreas: Atrophic pancreas without inflammation. Spleen: The spleen measures 12.1 x 4.8 x 9.7 cm (volume = 290 cm^3), normal in size and is without focal mass. Adrenals/Urinary Tract: Normal bilateral adrenal glands and kidneys. No nephrolithiasis, renal mass nor obstructive uropathy. The urinary bladder is partially obscured by the patient's right hip arthroplasty streak artifacts. No focal mural thickening, calculus or mass is identified of the bladder. Stomach/Bowel: Decompressed stomach. No small nor large bowel obstruction or inflammation. The appendix is not confidently identified but no pericecal inflammation is seen. Vascular/Lymphatic: Aortoiliac and branch vessel atherosclerosis without aneurysm or adenopathy. Reproductive: Atrophic uterus. No adnexal mass. Mild engorgement of the left gonadal vein. Other: No ascites or free air. Musculoskeletal: Right hip arthroplasty. Chronic moderate T11 and mild T12 compression deformities. No acute osseous abnormality. IMPRESSION: 1. 3 mm calculus in the distal common bile duct without significant  pancreatic or extrahepatic biliary dilatation. Correlate with liver function tests. Numerous gallstones are noted within the physiologically distended gallbladder. 2. Surface nodularity of the liver consistent with morphologic changes of cirrhosis. Tiny too small to further characterize hypodensities in the left and right hepatic lobes but statistically more likely to represent cysts or hemangiomata. 3. Chronic mild esophageal thickening along the distal aspect. Esophagitis is not excluded. 4. Small paraesophageal and periportal varices. 5. Aortoiliac and branch vessel atherosclerosis. 6. Chronic moderate T11 and mild T12 compression deformities. Electronically Signed   By: Ashley Royalty M.D.   On: 04/01/2018 15:54   Dg Chest Port 1 View  Result Date: 04/12/2018 CLINICAL DATA:  Altered mental status. EXAM: PORTABLE CHEST 1 VIEW COMPARISON:  10/30/2015 and prior radiographs FINDINGS: The cardiomediastinal silhouette is unremarkable. There is no evidence of focal airspace disease, pulmonary edema, suspicious pulmonary nodule/mass, pleural effusion, or pneumothorax. No acute bony abnormalities are identified. Chronic changes of the RIGHT humeral head again noted. IMPRESSION: No active disease. Electronically Signed   By: Margarette Canada M.D.   On: 04/13/2018 13:54    Impression: 1.  I think the picture is most suggestive of hepatic encephalopathy, with an elevated ammonia level of 80. 2.  The common bile duct stone is intriguing; although she clearly does not have frank septic cholangitis, it is conceivable she could have very low-grade smoldering cholangitis which might be accounting for her apparent hepatic encephalopathy development.  Plan: Agree with current management including antibiotics and lactulose enemas.  Continue to monitor labs.  Further management decisions will depend on the patient's clinical and biochemical evolution.   LOS: 1 day   Youlanda Mighty Izear Pine  04/09/2018, 9:41 PM   Pager  [phone] If no answer or after 5 PM call [phone]

## 2018-04-09 NOTE — Progress Notes (Signed)
Initial Nutrition Assessment  DOCUMENTATION CODES:   Non-severe (moderate) malnutrition in context of chronic illness, Underweight  INTERVENTION:   -RD will follow for diet advancement and supplement diet as appropriate -If pt remains unable to take nutrition via PO route, consider initiation of enteral nutrition support. Recommend:  Initiate Jevity 1.2 @ 20 ml/hr and increase by 10 ml every 8 hours to goal rate of 60 ml/hr.   Tube feeding regimen provides 1728 kcal (100% of needs), 80 grams of protein, and 1162 ml of H2O.   -If TF initiated, consider monitoring K, Mg, and Phos daily x 3 days and replete as needed due to refeeding risk.   NUTRITION DIAGNOSIS:   Moderate Malnutrition related to chronic illness(alcoholic liver disease) as evidenced by mild fat depletion, moderate fat depletion, mild muscle depletion, moderate muscle depletion.  GOAL:   Patient will meet greater than or equal to 90% of their needs  MONITOR:   Diet advancement, Labs, Weight trends, Skin, I & O's  REASON FOR ASSESSMENT:   Malnutrition Screening Tool    ASSESSMENT:   Teresa Schneider is a 72 y.o. female with history h/o alcoholic liver disease,?  Varices, ascites, depression/ADD, sleep apnea noncompliant with CPAP, chronic iron deficiency anemia who apparently lives by herself brought in by friend after found to be confused and disoriented.  Currently patient nonverbal and just nods her head when called her name.  No family or friends present bedside.  I tried calling her emergency contact listed in the medical record but no response.  Most of the history obtained by talking to ED physician and chart review.  Pt admitted with acute metabolic encephalopathy.   Pt lying in bed at time of visit. She did not respond to her name being called, but responded mildly to touch. Obtaining physical exam was difficulty due to pt moving erratically in bed.   No family or caregivers at bedside to provide  further history.   Reviewed wt hx; pt has experienced a 7.6% wt loss over the past 4 months, which while not significant, is concerning given pt's multiple comorbidities.   Per MD notes, considering NGT for medications and lactulose. Pt may require enteral nutrition support as well if mental status does not improve.   Labs reviewed: K: 3.4  NUTRITION - FOCUSED PHYSICAL EXAM:    Most Recent Value  Orbital Region  Mild depletion  Upper Arm Region  Moderate depletion  Thoracic and Lumbar Region  Mild depletion  Buccal Region  Mild depletion  Temple Region  Mild depletion  Clavicle Bone Region  Mild depletion  Clavicle and Acromion Bone Region  Mild depletion  Scapular Bone Region  Mild depletion  Dorsal Hand  Moderate depletion  Patellar Region  Moderate depletion  Anterior Thigh Region  Moderate depletion  Posterior Calf Region  Moderate depletion  Edema (RD Assessment)  None  Hair  Reviewed  Eyes  Reviewed  Mouth  Reviewed  Skin  Reviewed  Nails  Reviewed       Diet Order:   Diet Order            Diet NPO time specified  Diet effective now              EDUCATION NEEDS:   Not appropriate for education at this time  Skin:  Skin Assessment: Reviewed RN Assessment  Last BM:  PTA  Height:   Ht Readings from Last 1 Encounters:  04/12/2018 5\' 5"  (1.651 m)    Weight:  Wt Readings from Last 1 Encounters:  04/11/2018 47.9 kg    Ideal Body Weight:  56.8 kg  BMI:  Body mass index is 17.57 kg/m.  Estimated Nutritional Needs:   Kcal:  1600-1800  Protein:  80-95 grams  Fluid:  > 1.6 L    Aleeyah Bensen A. Jimmye Norman, RD, LDN, CDE Pager: [phone] After hours Pager: [phone]

## 2018-04-09 NOTE — Progress Notes (Signed)
PROGRESS NOTE    Teresa Schneider  SNK:[id number] DOB: 09/11/1945 DOA: 04/06/2018 PCP: Elby Showers, MD    Brief Narrative:  Teresa Schneider is a 72 y.o. female with history h/o alcoholic liver disease?  Varices, ascites, depression/ADD, sleep apnea noncompliant with CPAP, chronic iron deficiency anemia who apparently lives by herself brought in by friend after found to be confused and disoriented.  Currently patient nonverbal and just nods her head when called her name.  No family or friends present bedside. Most of the history obtained by talking to ED physician and chart review.  Per ED report, patient's friend who lives next-door found her sitting in a chair confused.  She was apparently at her baseline yesterday when visited by the same person.  Patient does have a history of heavy alcohol use.  It is unclear if she still drinks alcohol.  Work-up in the ED revealed abnormal EKG with lateral T wave inversions and lab work showing elevated troponin, leukocytosis, elevated liver enzymes, ammonia level, CK and lactate.  Patient initiated on sepsis protocol by ED and initiated on empiric antibiotics.  She underwent CT abdomen which showed gallstones as well as 3 mm CBD stone.  She has so far received vancomycin, cefepime and Flagyl.  Patient evaluated by cardiology and started on rectal aspirin/heparin drip.  GI evaluation is pending.   -Patient at this time continues to be nonverbal, confused. -Unable to contact the family.   Assessment & Plan:   Principal Problem:   Sepsis ([place]) Active Problems:   Elevated troponin   AMS (altered mental status)   Malnutrition of moderate degree   1.  Acute metabolic encephalopathy: Secondary to hepatic encephalopathy versus sepsis versus CVA.  CT head negative for any acute findings.  Ammonia level elevated.  Patient currently not safe for oral intake with high aspiration risk.   -Continue lactulose enema and IV fluids/antibiotics.   - Consider  MRI head if no improvement with these measures and when family/friends available for MRI screening questionnaire.  - Alcohol level less than 10, does not appear to be in acute withdrawal although postictal state from his seizure could be a possibility.  IV thiamine.  Consider NG tube for meds including lactulose if okay per GI. -If mental status does not improved we will consider consulting neurology.  2.  Sepsis syndrome, abdominal tenderness: Patient has elevated lactate, leukocytosis at 19,000 and CT abdomen shows evidence of gallstones and CBD 3 mm stone which also correlates with elevated LFTs.  - Treating empirically for cholangitis.  GI eval pending.  N.p.o., IV fluids (moderate rate given history of ascites/liver disease) and continue cefepime/Flagyl.  Blood cultures ordered.  Patient did receive 1 dose of vancomycin in the ED.   - Blood pressure improved.  Repeat labs in a.m.  3. Troponemia: No report of chest pain but patient unable to give history currently. Patient does have abnormal EKG although could be LVH related.  Echo ordred.  Appreciate cardiology evaluation who feel demand ischemia secondary to problem #2.  Patient received rectal aspirin in the ED and heparin drip has been initiated.  4.  Alcoholic liver disease with varices/ascites: CT abdomen does report cirrhotic changes and varices.  No signs of bleeding.  Hemoglobin stable.  Elevated lactate could be secondary to liver disease.  Currently no clinical signs of fluid retention.  Watch on IV hydration.  Albumin/protein level WNL  5.  Depression/ADD: Hold oral medications for now  6.  Elevated CK: Hold statins.  Unclear how long she was in the chair?  Seizure episode.  - Continue IV hydration. Repeat labs in a.m.  DVT prophylaxis: On heparin drip Code Status: Full Family Communication: Unable to reach family Disposition Plan: Unknown at this time   Consultants:   Cardiology  GI  Procedures:    None  Antimicrobials:   Vancomycin, Cefepime, Flagyl    Subjective: She is still confused, nods head when name is called but not following commands.  Nonverbal  Objective: Vitals:   04/03/2018 2013 04/14/2018 2354 04/09/18 0512 04/09/18 1230  BP: (!) 154/73 (!) 146/78 (!) 158/87   Pulse: 91 84 83 90  Resp: (!) 22 20 (!) 25 13  Temp: 98.4 F (36.9 C) 97.7 F (36.5 C) (!) 97.5 F (36.4 C)   TempSrc: Axillary Oral Oral Oral  SpO2: 91% 91% 95%   Weight: 47.9 kg     Height: 5\' 5"  (1.651 m)       Intake/Output Summary (Last 24 hours) at 04/09/2018 1411 Last data filed at 04/09/2018 1311 Gross per 24 hour  Intake 4402.54 ml  Output 1825 ml  Net 2577.54 ml   Filed Weights   03/29/2018 1329 03/18/2018 2013  Weight: 51.7 kg 47.9 kg    Examination:  General exam: confused  Respiratory system: Occasional rhonchi, no wheezing. Respiratory effort normal. Cardiovascular system: S1 & S2 heard, RRR. No JVD, murmurs, rubs, gallops or clicks. No pedal edema. Gastrointestinal system: Abdomen is nondistended, tenderness?  No guarding or rebound. No organomegaly or masses felt. Normal bowel sounds heard. Central nervous system: Confused, nonverbal, not following commands at this time. Extremities: No lower extremity edema. Skin: No rashes Psychiatry: Confused   Data Reviewed: I have personally reviewed following labs and imaging studies  CBC: Recent Labs  Lab 04/11/2018 1358 04/09/18 0234  WBC 19.0* 19.2*  NEUTROABS 15.0*  --   HGB 14.6 13.8  HCT 46.3* 41.5  MCV 98.7 95.6  PLT PLATELET CLUMPS NOTED ON SMEAR, UNABLE TO ESTIMATE PLATELET CLUMPS NOTED ON SMEAR, UNABLE TO ESTIMATE   Basic Metabolic Panel: Recent Labs  Lab 03/22/2018 1358 04/09/18 0234  NA 140 136  K 3.6 3.4*  CL 111 106  CO2 17* 20*  GLUCOSE 144* 106*  BUN 21 18  CREATININE 0.81 0.74  CALCIUM 9.3 8.3*   GFR: Estimated Creatinine Clearance: 49.5 mL/min (by C-G formula based on SCr of 0.74 mg/dL). Liver  Function Tests: Recent Labs  Lab 04/14/2018 1358 04/09/18 0234  AST 108* 95*  ALT 41 38  ALKPHOS 131* 118  BILITOT 5.0* 4.4*  PROT 7.3 6.4*  ALBUMIN 3.9 3.2*   Recent Labs  Lab 03/23/2018 1358  LIPASE 29   Recent Labs  Lab 03/23/2018 1358 04/09/18 0810  AMMONIA 53* 80*   Coagulation Profile: Recent Labs  Lab 04/03/2018 1548  INR 1.48   Cardiac Enzymes: Recent Labs  Lab 04/04/2018 1358 04/09/18 0810 04/09/18 1120  CKTOTAL 1,223* 460*  --   TROPONINI  --   --  1.50*   BNP (last 3 results) No results for input(s): PROBNP in the last 8760 hours. HbA1C: No results for input(s): HGBA1C in the last 72 hours. CBG: Recent Labs  Lab 04/13/2018 1320  GLUCAP 128*   Lipid Profile: No results for input(s): CHOL, HDL, LDLCALC, TRIG, CHOLHDL, LDLDIRECT in the last 72 hours. Thyroid Function Tests: No results for input(s): TSH, T4TOTAL, FREET4, T3FREE, THYROIDAB in the last 72 hours. Anemia Panel: No results for input(s): VITAMINB12, FOLATE, FERRITIN, TIBC, IRON,  RETICCTPCT in the last 72 hours. Sepsis Labs: Recent Labs  Lab 04/12/2018 1451 04/11/2018 1804 04/01/2018 1842 04/03/2018 2114  LATICACIDVEN 4.29* 2.9* 3.34* 2.4*    No results found for this or any previous visit (from the past 240 hour(s)).    Radiology Studies: Ct Head Wo Contrast  Result Date: 03/31/2018 CLINICAL DATA:  Altered mental status. EXAM: CT HEAD WITHOUT CONTRAST TECHNIQUE: Contiguous axial images were obtained from the base of the skull through the vertex without intravenous contrast. COMPARISON:  None. FINDINGS: Brain: Normal appearing cerebral hemispheres and posterior fossa structures. Normal size and position of the ventricles. No intracranial hemorrhage, mass lesion or CT evidence of acute infarction. Vascular: No hyperdense vessel or unexpected calcification. Skull: Normal. Negative for fracture or focal lesion. Sinuses/Orbits: Status post bilateral cataract extraction. Unremarkable paranasal sinuses.  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 IMPRESSION: No acute abnormality. 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 Electronically Signed   By: Claudie Revering M.D.   On: 03/29/2018 14:01   Ct Abdomen Pelvis W Contrast  Result Date: 04/11/2018 CLINICAL DATA:  Abdominal distention, altered mentation and lethargy. History of alcoholism. EXAM: CT ABDOMEN AND PELVIS WITH CONTRAST TECHNIQUE: Multidetector CT imaging of the abdomen and pelvis was performed using the standard protocol following bolus administration of intravenous contrast. CONTRAST:  183mL OMNIPAQUE IOHEXOL 300 MG/ML  SOLN COMPARISON:  07/11/2007 FINDINGS: Lower chest: Cardiomegaly without pericardial effusion or thickening. Bibasilar dependent atelectasis is noted. Further assessment is somewhat limited by respiratory motion artifacts. Small paraesophageal varices with thickened distal esophagus redemonstrated. Hepatobiliary: Surface nodularity of the liver consistent with morphologic changes of cirrhosis. Tiny too small to characterize hypodensities are noted within the left and right hepatic lobes too small to further characterize but may reflect cysts or hemangiomata. Gallbladder is non thickened but contains layering dependent calculi. There is a 3 mm calculus also noted in the distal common bile duct, series 3/28 without significant pancreatic nor extrahepatic biliary dilatation noted. Correlate with liver function tests there has been some interval development portal varices. Pancreas: Atrophic pancreas without inflammation. Spleen: The spleen measures 12.1 x 4.8 x 9.7 cm (volume = 290 cm^3), normal in size and is without focal mass. Adrenals/Urinary Tract: Normal bilateral adrenal glands and kidneys. No nephrolithiasis, renal mass nor obstructive uropathy. The urinary bladder is partially obscured by the patient's right hip arthroplasty streak artifacts. No focal mural  thickening, calculus or mass is identified of the bladder. Stomach/Bowel: Decompressed stomach. No small nor large bowel obstruction or inflammation. The appendix is not confidently identified but no pericecal inflammation is seen. Vascular/Lymphatic: Aortoiliac and branch vessel atherosclerosis without aneurysm or adenopathy. Reproductive: Atrophic uterus. No adnexal mass. Mild engorgement of the left gonadal vein. Other: No ascites or free air. Musculoskeletal: Right hip arthroplasty. Chronic moderate T11 and mild T12 compression deformities. No acute osseous abnormality. IMPRESSION: 1. 3 mm calculus in the distal common bile duct without significant pancreatic or extrahepatic biliary dilatation. Correlate with liver function tests. Numerous gallstones are noted within the physiologically distended gallbladder. 2. Surface nodularity of the liver consistent with morphologic changes of cirrhosis. Tiny too small to further characterize hypodensities in the left and right hepatic lobes but statistically more likely to represent cysts or hemangiomata. 3. Chronic mild esophageal thickening along the distal aspect. Esophagitis is not excluded. 4. Small paraesophageal and periportal varices. 5. Aortoiliac and branch vessel atherosclerosis. 6. Chronic moderate T11 and mild T12 compression deformities. Electronically Signed   By: Meredith Leeds.D.  On: 04/14/2018 15:54   Dg Chest Port 1 View  Result Date: 03/22/2018 CLINICAL DATA:  Altered mental status. EXAM: PORTABLE CHEST 1 VIEW COMPARISON:  10/30/2015 and prior radiographs FINDINGS: The cardiomediastinal silhouette is unremarkable. There is no evidence of focal airspace disease, pulmonary edema, suspicious pulmonary nodule/mass, pleural effusion, or pneumothorax. No acute bony abnormalities are identified. Chronic changes of the RIGHT humeral head again noted. IMPRESSION: No active disease. Electronically Signed   By: Margarette Canada M.D.   On: 03/21/2018 13:54         Scheduled Meds: . aspirin  300 mg Rectal Daily  . lactulose  300 mL Rectal Once  . lactulose  300 mL Rectal Daily  . thiamine injection  100 mg Intravenous Daily   Continuous Infusions: . sodium chloride 100 mL/hr at 04/09/18 1311  . ceFEPime (MAXIPIME) IV    . heparin 900 Units/hr (04/09/18 1311)     LOS: 1 day    Time spent: 35 min    Yaakov Guthrie MD [facility] Pager [phone]  If 7PM-7AM, please contact night-coverage [email or web address] Password [facility] 04/09/2018, 2:11 PM

## 2018-04-09 NOTE — Progress Notes (Signed)
Teresa Schneider for Heparin Indication: chest pain/ACS  Allergies  Allergen Reactions  . Salmon [Fish Allergy] Dermatitis  . Other Dermatitis    Paprika  . Sulfamethoxazole-Trimethoprim     REACTION: rash  . Penicillins Rash  . Pyridium [Phenazopyridine Hcl] Rash    Patient Measurements: Height: 5\' 5"  (165.1 cm)(est. non-verbal) Weight: 105 lb 9.6 oz (47.9 kg) IBW/kg (Calculated) : 57 Heparin Dosing Weight: 51.7 kg  Vital Signs: Temp: 97.7 F (36.5 C) (11/22 2354) Temp Source: Oral (11/22 2354) BP: 146/78 (11/22 2354) Pulse Rate: 84 (11/22 2354)  Labs: Recent Labs    03/18/2018 1358 04/02/2018 1548 04/09/18 0234  HGB 14.6  --  13.8  HCT 46.3*  --  41.5  PLT PLATELET CLUMPS NOTED ON SMEAR, UNABLE TO ESTIMATE  --  PLATELET CLUMPS NOTED ON SMEAR, UNABLE TO ESTIMATE  LABPROT  --  17.8*  --   INR  --  1.48  --   HEPARINUNFRC  --   --  <0.10*  CREATININE 0.81  --  0.74  CKTOTAL 1,223*  --   --     Estimated Creatinine Clearance: 49.5 mL/min (by C-G formula based on SCr of 0.74 mg/dL).   Medical History: Past Medical History:  Diagnosis Date  . ADD (attention deficit disorder)   . Alcoholic hepatitis   . Anemia    low iron  . Ascites   . Depression   . Palpitations   . Sleep apnea    does not use cpap    Assessment: Patient 81 yoF presenting with AMS and elevated troponin. Pharmacy has been consulted for heparin dosing. Unable to confirm at this time if patient on anticoagulation PTA due to altered state. Per chart review, there is no history of patient taking anticoagulation.  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  11/23 AM update: heparin level undetectable this AM, no issues per RN.   Goal of Therapy:   Heparin level 0.3-0.5 units/mL Monitor platelets by anticoagulation protocol: Yes   Plan:  Inc  heparin to 750 units/hr Re-check heparin level in 6-8 hours  Narda Bonds, PharmD, [facility] Pharmacist Phone: [phone]

## 2018-04-09 NOTE — Progress Notes (Signed)
DAILY PROGRESS NOTE   Patient Name: Teresa Schneider Date of Encounter: 04/09/2018  Chief Complaint   Does not follow commands, writing around  Patient Profile   Teresa Schneider is a 72 y.o. female with a PMH of alcohol abuse, tobacco abuse, COPD, and mild bilateral carotid artery disease who is being seen today for the evaluation of elevated troponin at the request of Dr. Ronnald Nian.  Subjective   Disoriented today - does not follow commands. Writhing around and grunting. Could not assess any subjective symptoms.  Objective   Vitals:   04/12/2018 1930 03/24/2018 2013 03/30/2018 2354 04/09/18 0512  BP: (!) 144/73 (!) 154/73 (!) 146/78 (!) 158/87  Pulse: 89 91 84 83  Resp: (!) 22 (!) 22 20 (!) 25  Temp:  98.4 F (36.9 C) 97.7 F (36.5 C) (!) 97.5 F (36.4 C)  TempSrc:  Axillary Oral Oral  SpO2: 99% 91% 91% 95%  Weight:  47.9 kg    Height:  5' 5"  (1.651 m)      Intake/Output Summary (Last 24 hours) at 04/09/2018 1020 Last data filed at 04/09/2018 1011 Gross per 24 hour  Intake 2973.67 ml  Output 1675 ml  Net 1298.67 ml   Filed Weights   04/09/2018 1329 03/25/2018 2013  Weight: 51.7 kg 47.9 kg    Physical Exam   General appearance: alert and combative Lungs: diminished breath sounds bibasilar Heart: regular rate and rhythm Extremities: extremities normal, atraumatic, no cyanosis or edema Neurologic: Mental status: Agitated, grunting  Inpatient Medications    Scheduled Meds: . aspirin  300 mg Rectal Daily  . lactulose  300 mL Rectal Once  . thiamine injection  100 mg Intravenous Daily    Continuous Infusions: . sodium chloride 100 mL/hr at 04/09/18 0544  . ceFEPime (MAXIPIME) IV    . heparin 750 Units/hr (04/09/18 0341)    PRN Meds: acetaminophen **OR** acetaminophen, ondansetron **OR** ondansetron (ZOFRAN) IV   Labs   Results for orders placed or performed during the hospital encounter of 03/31/2018 (from the past 48 hour(s))  CBG monitoring, ED      Status: Abnormal   Collection Time: 04/06/2018  1:20 PM  Result Value Ref Range   Glucose-Capillary 128 (H) 70 - 99 mg/dL  Comprehensive metabolic panel     Status: Abnormal   Collection Time: 03/24/2018  1:58 PM  Result Value Ref Range   Sodium 140 135 - 145 mmol/L   Potassium 3.6 3.5 - 5.1 mmol/L   Chloride 111 98 - 111 mmol/L   CO2 17 (L) 22 - 32 mmol/L   Glucose, Bld 144 (H) 70 - 99 mg/dL   BUN 21 8 - 23 mg/dL   Creatinine, Ser 0.81 0.44 - 1.00 mg/dL   Calcium 9.3 8.9 - 10.3 mg/dL   Total Protein 7.3 6.5 - 8.1 g/dL   Albumin 3.9 3.5 - 5.0 g/dL   AST 108 (H) 15 - 41 U/L   ALT 41 0 - 44 U/L   Alkaline Phosphatase 131 (H) 38 - 126 U/L   Total Bilirubin 5.0 (H) 0.3 - 1.2 mg/dL   GFR calc non Af Amer >60 >60 mL/min   GFR calc Af Amer >60 >60 mL/min    Comment: (NOTE) The eGFR has been calculated using the CKD EPI equation. This calculation has not been validated in all clinical situations. eGFR's persistently <60 mL/min signify possible Chronic Kidney Disease.    Anion gap 12 5 - 15    Comment: Performed at  [facility], [place] [address], [place], [place] [address]  CBC WITH DIFFERENTIAL     Status: Abnormal   Collection Time: 03/28/2018  1:58 PM  Result Value Ref Range   WBC 19.0 (H) 4.0 - 10.5 K/uL   RBC 4.69 3.87 - 5.11 MIL/uL   Hemoglobin 14.6 12.0 - 15.0 g/dL   HCT 46.3 (H) 36.0 - 46.0 %   MCV 98.7 80.0 - 100.0 fL   MCH 31.1 26.0 - 34.0 pg   MCHC 31.5 30.0 - 36.0 g/dL   RDW 16.2 (H) 11.5 - 15.5 %   Platelets PLATELET CLUMPS NOTED ON SMEAR, UNABLE TO ESTIMATE 150 - 400 K/uL    Comment: Immature Platelet Fraction may be clinically indicated, consider ordering this additional test [id number]    nRBC 0.2 0.0 - 0.2 %   Neutrophils Relative % 79 %   Neutro Abs 15.0 (H) 1.7 - 7.7 K/uL   Lymphocytes Relative 11 %   Lymphs Abs 2.1 0.7 - 4.0 K/uL   Monocytes Relative 8 %   Monocytes Absolute 1.6 (H) 0.1 - 1.0 K/uL   Eosinophils Relative 1 %   Eosinophils Absolute 0.2 0.0  - 0.5 K/uL   Basophils Relative 0 %   Basophils Absolute 0.1 0.0 - 0.1 K/uL   Immature Granulocytes 1 %   Abs Immature Granulocytes 0.10 (H) 0.00 - 0.07 K/uL    Comment: Performed at [facility] [address], [place], [place] [address]  Ammonia     Status: Abnormal   Collection Time: 03/20/2018  1:58 PM  Result Value Ref Range   Ammonia 53 (H) 9 - 35 umol/L    Comment: Performed at [facility], [place] [address] [place], [place] [address]  Ethanol     Status: None   Collection Time: 03/19/2018  1:58 PM  Result Value Ref Range   Alcohol, Ethyl (B) <10 <10 mg/dL    Comment: (NOTE) Lowest detectable limit for serum alcohol is 10 mg/dL. For medical purposes only. Performed at [facility], [place] [address] [place], [place] [address]   CK     Status: Abnormal   Collection Time: 04/07/2018  1:58 PM  Result Value Ref Range   Total CK 1,223 (H) 38 - 234 U/L    Comment: Performed at [facility], [place] [address], [place], [place] [address]  Lipase, blood     Status: None   Collection Time: 03/29/2018  1:58 PM  Result Value Ref Range   Lipase 29 11 - 51 U/L    Comment: Performed at [facility] [address] [place], [place] [address]  I-Stat Troponin, ED (not at [facility])     Status: Abnormal   Collection Time: 04/07/2018  2:49 PM  Result Value Ref Range   Troponin i, poc 3.04 (HH) 0.00 - 0.08 ng/mL   Comment NOTIFIED PHYSICIAN    Comment 3            Comment: Due to the release kinetics of cTnI, a negative result within the first hours of the onset of symptoms does not rule out myocardial infarction with certainty. If myocardial infarction is still suspected, repeat the test at appropriate intervals.   I-Stat CG4 Lactic Acid, ED     Status: Abnormal   Collection Time: 04/16/2018  2:51 PM  Result Value Ref Range   Lactic Acid, Venous 4.29 (HH) 0.5 - 1.9 mmol/L   Comment NOTIFIED PHYSICIAN  Protime-INR     Status: Abnormal   Collection Time: 03/19/2018  3:48 PM   Result Value Ref Range   Prothrombin Time 17.8 (H) 11.4 - 15.2 seconds   INR 1.48     Comment: Performed at [facility], [place] [address], [place], [place] [address]  Urinalysis, Complete w Microscopic     Status: Abnormal   Collection Time: 04/03/2018  4:25 PM  Result Value Ref Range   Color, Urine YELLOW YELLOW   APPearance CLEAR CLEAR   Specific Gravity, Urine 1.022 1.005 - 1.030   pH 6.0 5.0 - 8.0   Glucose, UA 50 (A) NEGATIVE mg/dL   Hgb urine dipstick MODERATE (A) NEGATIVE   Bilirubin Urine NEGATIVE NEGATIVE   Ketones, ur NEGATIVE NEGATIVE mg/dL   Protein, ur >=300 (A) NEGATIVE mg/dL   Nitrite NEGATIVE NEGATIVE   Leukocytes, UA NEGATIVE NEGATIVE   RBC / HPF 0-5 0 - 5 RBC/hpf   WBC, UA 0-5 0 - 5 WBC/hpf   Bacteria, UA RARE (A) NONE SEEN   Mucus PRESENT    Hyaline Casts, UA PRESENT     Comment: Performed at [facility] [address], [place], [place] [address]  Urine rapid drug screen (hosp performed)     Status: None   Collection Time: 04/03/2018  4:25 PM  Result Value Ref Range   Opiates NONE DETECTED NONE DETECTED   Cocaine NONE DETECTED NONE DETECTED   Benzodiazepines NONE DETECTED NONE DETECTED   Amphetamines NONE DETECTED NONE DETECTED   Tetrahydrocannabinol NONE DETECTED NONE DETECTED   Barbiturates NONE DETECTED NONE DETECTED    Comment: (NOTE) DRUG SCREEN FOR MEDICAL PURPOSES ONLY.  IF CONFIRMATION IS NEEDED FOR ANY PURPOSE, NOTIFY LAB WITHIN 5 DAYS. LOWEST DETECTABLE LIMITS FOR URINE DRUG SCREEN Drug Class                     Cutoff (ng/mL) Amphetamine and metabolites    1000 Barbiturate and metabolites    200 Benzodiazepine                 295 Tricyclics and metabolites     300 Opiates and metabolites        300 Cocaine and metabolites        300 THC                            50 Performed at [facility], [place] [address] [place], [place] [address]   HIV antibody (Routine Testing)     Status: None   Collection Time: 03/25/2018  6:00  PM  Result Value Ref Range   HIV Screen 4th Generation wRfx Non Reactive Non Reactive    Comment: (NOTE) Performed At: [facility] [place], [place] [id number] Rush Farmer MD [id number]   Brain natriuretic peptide     Status: Abnormal   Collection Time: 04/09/2018  6:02 PM  Result Value Ref Range   B Natriuretic Peptide 3,145.6 (H) 0.0 - 100.0 pg/mL    Comment: Performed at [facility] [address] [place], [place] [address]  Lactic acid, plasma     Status: Abnormal   Collection Time: 03/31/2018  6:04 PM  Result Value Ref Range   Lactic Acid, Venous 2.9 (HH) 0.5 - 1.9 mmol/L    Comment: CRITICAL RESULT CALLED TO, READ BACK BY AND VERIFIED WITHNorberto Sorenson 03/19/2018 WBOND Performed at [facility]  [facility], [place] [address] [place], [place] [address]   I-Stat CG4 Lactic Acid, ED     Status: Abnormal   Collection Time: 04/04/2018  6:42 PM  Result Value Ref Range   Lactic Acid, Venous 3.34 (HH) 0.5 - 1.9 mmol/L   Comment NOTIFIED PHYSICIAN   Lactic acid, plasma     Status: Abnormal   Collection Time: 04/02/2018  9:14 PM  Result Value Ref Range   Lactic Acid, Venous 2.4 (HH) 0.5 - 1.9 mmol/L    Comment: CRITICAL RESULT CALLED TO, READ BACK BY AND VERIFIED WITH: Gwynneth Aliment 03/25/2018 2201 WAYK Performed at [facility], [place] [address], [place], [place] [address]   Heparin level (unfractionated)     Status: Abnormal   Collection Time: 04/09/18  2:34 AM  Result Value Ref Range   Heparin Unfractionated <0.10 (L) 0.30 - 0.70 IU/mL    Comment: (NOTE) If heparin results are below expected values, and patient dosage has  been confirmed, suggest follow up testing of antithrombin III levels. Performed at [facility], [place] [address], [place], [place] [address]   CBC     Status: Abnormal   Collection Time: 04/09/18  2:34 AM  Result Value Ref Range   WBC 19.2 (H) 4.0 - 10.5 K/uL   RBC 4.34 3.87 - 5.11 MIL/uL   Hemoglobin 13.8 12.0 - 15.0  g/dL   HCT 41.5 36.0 - 46.0 %   MCV 95.6 80.0 - 100.0 fL   MCH 31.8 26.0 - 34.0 pg   MCHC 33.3 30.0 - 36.0 g/dL   RDW 15.9 (H) 11.5 - 15.5 %   Platelets PLATELET CLUMPS NOTED ON SMEAR, UNABLE TO ESTIMATE 150 - 400 K/uL    Comment: Immature Platelet Fraction may be clinically indicated, consider ordering this additional test [id number]    nRBC 0.2 0.0 - 0.2 %    Comment: Performed at [facility], [place] [address], [place], [place] [address]  Comprehensive metabolic panel     Status: Abnormal   Collection Time: 04/09/18  2:34 AM  Result Value Ref Range   Sodium 136 135 - 145 mmol/L   Potassium 3.4 (L) 3.5 - 5.1 mmol/L   Chloride 106 98 - 111 mmol/L   CO2 20 (L) 22 - 32 mmol/L   Glucose, Bld 106 (H) 70 - 99 mg/dL   BUN 18 8 - 23 mg/dL   Creatinine, Ser 0.74 0.44 - 1.00 mg/dL   Calcium 8.3 (L) 8.9 - 10.3 mg/dL   Total Protein 6.4 (L) 6.5 - 8.1 g/dL   Albumin 3.2 (L) 3.5 - 5.0 g/dL   AST 95 (H) 15 - 41 U/L   ALT 38 0 - 44 U/L   Alkaline Phosphatase 118 38 - 126 U/L   Total Bilirubin 4.4 (H) 0.3 - 1.2 mg/dL   GFR calc non Af Amer >60 >60 mL/min   GFR calc Af Amer >60 >60 mL/min    Comment: (NOTE) The eGFR has been calculated using the CKD EPI equation. This calculation has not been validated in all clinical situations. eGFR's persistently <60 mL/min signify possible Chronic Kidney Disease.    Anion gap 10 5 - 15    Comment: Performed at [facility] [address] [place], [place] [address]  Ammonia     Status: Abnormal   Collection Time: 04/09/18  8:10 AM  Result Value Ref Range   Ammonia 80 (H) 9 - 35 umol/L  Comment: Performed at [facility], [place] [address], [place], [place] [address]  CK     Status: Abnormal   Collection Time: 04/09/18  8:10 AM  Result Value Ref Range   Total CK 460 (H) 38 - 234 U/L    Comment: Performed at [facility], [place] [address] [place], [place] [address]    ECG   N/A  Telemetry   Sinus rhythm with TWI's -  Personally Reviewed  Radiology    Ct Head Wo Contrast  Result Date: 04/07/2018 CLINICAL DATA:  Altered mental status. EXAM: CT HEAD WITHOUT CONTRAST TECHNIQUE: Contiguous axial images were obtained from the base of the skull through the vertex without intravenous contrast. COMPARISON:  None. FINDINGS: Brain: Normal appearing cerebral hemispheres and posterior fossa structures. Normal size and position of the ventricles. No intracranial hemorrhage, mass lesion or CT evidence of acute infarction. Vascular: No hyperdense vessel or unexpected calcification. Skull: Normal. Negative for fracture or focal lesion. Sinuses/Orbits: Status post bilateral cataract extraction. Unremarkable paranasal sinuses. 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 IMPRESSION: No acute abnormality. 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 Electronically Signed   By: Claudie Revering M.D.   On: 03/23/2018 14:01   Ct Abdomen Pelvis W Contrast  Result Date: 04/14/2018 CLINICAL DATA:  Abdominal distention, altered mentation and lethargy. History of alcoholism. EXAM: CT ABDOMEN AND PELVIS WITH CONTRAST TECHNIQUE: Multidetector CT imaging of the abdomen and pelvis was performed using the standard protocol following bolus administration of intravenous contrast. CONTRAST:  162m OMNIPAQUE IOHEXOL 300 MG/ML  SOLN COMPARISON:  07/11/2007 FINDINGS: Lower chest: Cardiomegaly without pericardial effusion or thickening. Bibasilar dependent atelectasis is noted. Further assessment is somewhat limited by respiratory motion artifacts. Small paraesophageal varices with thickened distal esophagus redemonstrated. Hepatobiliary: Surface nodularity of the liver consistent with morphologic changes of cirrhosis. Tiny too small to characterize hypodensities are noted within the left and right hepatic lobes too small to further characterize but may reflect cysts or hemangiomata. Gallbladder  is non thickened but contains layering dependent calculi. There is a 3 mm calculus also noted in the distal common bile duct, series 3/28 without significant pancreatic nor extrahepatic biliary dilatation noted. Correlate with liver function tests there has been some interval development portal varices. Pancreas: Atrophic pancreas without inflammation. Spleen: The spleen measures 12.1 x 4.8 x 9.7 cm (volume = 290 cm^3), normal in size and is without focal mass. Adrenals/Urinary Tract: Normal bilateral adrenal glands and kidneys. No nephrolithiasis, renal mass nor obstructive uropathy. The urinary bladder is partially obscured by the patient's right hip arthroplasty streak artifacts. No focal mural thickening, calculus or mass is identified of the bladder. Stomach/Bowel: Decompressed stomach. No small nor large bowel obstruction or inflammation. The appendix is not confidently identified but no pericecal inflammation is seen. Vascular/Lymphatic: Aortoiliac and branch vessel atherosclerosis without aneurysm or adenopathy. Reproductive: Atrophic uterus. No adnexal mass. Mild engorgement of the left gonadal vein. Other: No ascites or free air. Musculoskeletal: Right hip arthroplasty. Chronic moderate T11 and mild T12 compression deformities. No acute osseous abnormality. IMPRESSION: 1. 3 mm calculus in the distal common bile duct without significant pancreatic or extrahepatic biliary dilatation. Correlate with liver function tests. Numerous gallstones are noted within the physiologically distended gallbladder. 2. Surface nodularity of the liver consistent with morphologic changes of cirrhosis. Tiny too small to further characterize hypodensities in the left and right hepatic lobes but statistically more likely to represent cysts or hemangiomata. 3. Chronic mild esophageal thickening along  the distal aspect. Esophagitis is not excluded. 4. Small paraesophageal and periportal varices. 5. Aortoiliac and branch vessel  atherosclerosis. 6. Chronic moderate T11 and mild T12 compression deformities. Electronically Signed   By: Ashley Royalty M.D.   On: 04/04/2018 15:54   Dg Chest Port 1 View  Result Date: 04/14/2018 CLINICAL DATA:  Altered mental status. EXAM: PORTABLE CHEST 1 VIEW COMPARISON:  10/30/2015 and prior radiographs FINDINGS: The cardiomediastinal silhouette is unremarkable. There is no evidence of focal airspace disease, pulmonary edema, suspicious pulmonary nodule/mass, pleural effusion, or pneumothorax. No acute bony abnormalities are identified. Chronic changes of the RIGHT humeral head again noted. IMPRESSION: No active disease. Electronically Signed   By: Margarette Canada M.D.   On: 03/23/2018 13:54    Cardiac Studies   Echo pending  Assessment   Principal Problem:   Sepsis ([place]) Active Problems:   Elevated troponin   AMS (altered mental status)   Plan   1. On IV Heparin for elevated troponin - BNP elevated and suspect there is a component of CHF, however, does not seem volume overloaded on exam. Receiving fluids for sepsis - etoh history. Will check troponin to ensure decline. Not a cath candidate, however, in this present state. Will follow-up on echo when it is performed, however, may not be possible d/t lack of patient's ability to cooperate and be still during the study.  Time Spent Directly with Patient:  I have spent a total of 25 minutes with the patient reviewing hospital notes, telemetry, EKGs, labs and examining the patient as well as establishing an assessment and plan that was discussed personally with the patient.  > 50% of time was spent in direct patient care.  Length of Stay:  LOS: 1 day   Pixie Casino, MD, [facility], [place] Director of the [facility] Lipid Disorders &  Cardiovascular Risk Reduction [facility] Diplomate of the American Board of Clinical Lipidology Attending Cardiologist  Direct Dial: [phone]  Fax: [phone]    Website:  [email or web address]  04/09/2018, 10:20 AM

## 2018-04-09 NOTE — Progress Notes (Addendum)
[facility] for Heparin Indication: chest pain/ACS  Allergies  Allergen Reactions  . Salmon [Fish Allergy] Dermatitis  . Other Dermatitis    Paprika  . Sulfamethoxazole-Trimethoprim     REACTION: rash  . Penicillins Rash  . Pyridium [Phenazopyridine Hcl] Rash    Patient Measurements: Height: 5\' 5"  (165.1 cm)(est. non-verbal) Weight: 105 lb 9.6 oz (47.9 kg) IBW/kg (Calculated) : 57 Heparin Dosing Weight: 51.7 kg  Vital Signs: Temp: 97.5 F (36.4 C) (11/23 0512) Temp Source: Oral (11/23 0512) BP: 158/87 (11/23 0512) Pulse Rate: 83 (11/23 0512)  Labs: Recent Labs    04/10/2018 1358 04/06/2018 1548 04/09/18 0234 04/09/18 0810 04/09/18 1120  HGB 14.6  --  13.8  --   --   HCT 46.3*  --  41.5  --   --   PLT PLATELET CLUMPS NOTED ON SMEAR, UNABLE TO ESTIMATE  --  PLATELET CLUMPS NOTED ON SMEAR, UNABLE TO ESTIMATE  --   --   LABPROT  --  17.8*  --   --   --   INR  --  1.48  --   --   --   HEPARINUNFRC  --   --  <0.10*  --  0.13*  CREATININE 0.81  --  0.74  --   --   CKTOTAL 1,223*  --   --  460*  --     Estimated Creatinine Clearance: 49.5 mL/min (by C-G formula based on SCr of 0.74 mg/dL).   Medical History: Past Medical History:  Diagnosis Date  . ADD (attention deficit disorder)   . Alcoholic hepatitis   . Anemia    low iron  . Ascites   . Depression   . Palpitations   . Sleep apnea    does not use cpap    Assessment: Patient 37 yoF presenting with AMS and elevated troponin. Pharmacy has been consulted for heparin dosing. Unable to confirm at this time if patient on anticoagulation PTA due to altered state. Per chart review, there is no history of patient taking anticoagulation.  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  Heparin level remains subtherapeutic at 0.13, on 750 units/hr. Hgb 13.8, plts clumped. No  s/sx of bleeding. No infusion issues per nursing.   Goal of Therapy:   Heparin level 0.3-0.5 units/mL Monitor platelets by anticoagulation protocol: Yes   Plan:  Increase heparin to 900 units/hr Re-check heparin level in 6-8 hours  Antonietta Jewel, PharmD Clinical Pharmacist  Pager: [phone] Phone: [phone]  ADDENDUM Heparin level came back at therapeutic at 0.35, on 900 units/hr. No s/sx of bleeding. No infusion issues. Continue at same rate and monitor daily heparin level.   Antonietta Jewel, PharmD Clinical Pharmacist

## 2018-04-09 NOTE — Progress Notes (Signed)
  Echocardiogram 2D Echocardiogram has been performed.  Merrie Roof F 04/09/2018, 3:50 PM

## 2018-04-10 LAB — COMPREHENSIVE METABOLIC PANEL
ALT: 37 U/L (ref 0–44)
AST: 70 U/L — ABNORMAL HIGH (ref 15–41)
Albumin: 2.7 g/dL — ABNORMAL LOW (ref 3.5–5.0)
Alkaline Phosphatase: 100 U/L (ref 38–126)
Anion gap: 7 (ref 5–15)
BUN: 32 mg/dL — ABNORMAL HIGH (ref 8–23)
CHLORIDE: 114 mmol/L — AB (ref 98–111)
CO2: 20 mmol/L — ABNORMAL LOW (ref 22–32)
CREATININE: 0.74 mg/dL (ref 0.44–1.00)
Calcium: 7.7 mg/dL — ABNORMAL LOW (ref 8.9–10.3)
Glucose, Bld: 119 mg/dL — ABNORMAL HIGH (ref 70–99)
Potassium: 3.3 mmol/L — ABNORMAL LOW (ref 3.5–5.1)
Sodium: 141 mmol/L (ref 135–145)
TOTAL PROTEIN: 5.6 g/dL — AB (ref 6.5–8.1)
Total Bilirubin: 4.2 mg/dL — ABNORMAL HIGH (ref 0.3–1.2)

## 2018-04-10 LAB — CBC
HCT: 39 % (ref 36.0–46.0)
Hemoglobin: 12.5 g/dL (ref 12.0–15.0)
MCH: 31.3 pg (ref 26.0–34.0)
MCHC: 32.1 g/dL (ref 30.0–36.0)
MCV: 97.5 fL (ref 80.0–100.0)
PLATELETS: 112 10*3/uL — AB (ref 150–400)
RBC: 4 MIL/uL (ref 3.87–5.11)
RDW: 16.1 % — AB (ref 11.5–15.5)
WBC: 19.1 10*3/uL — ABNORMAL HIGH (ref 4.0–10.5)
nRBC: 0.3 % — ABNORMAL HIGH (ref 0.0–0.2)

## 2018-04-10 LAB — AMMONIA: AMMONIA: 66 umol/L — AB (ref 9–35)

## 2018-04-10 LAB — HEPARIN LEVEL (UNFRACTIONATED): Heparin Unfractionated: 0.32 IU/mL (ref 0.30–0.70)

## 2018-04-10 LAB — CK: CK TOTAL: 254 U/L — AB (ref 38–234)

## 2018-04-10 MED ORDER — POTASSIUM CHLORIDE 10 MEQ/100ML IV SOLN
10.0000 meq | INTRAVENOUS | Status: AC
  Administered 2018-04-10 (×4): 10 meq via INTRAVENOUS
  Filled 2018-04-10 (×4): qty 100

## 2018-04-10 MED ORDER — MAGNESIUM SULFATE 2 GM/50ML IV SOLN
2.0000 g | Freq: Once | INTRAVENOUS | Status: AC
  Administered 2018-04-10: 2 g via INTRAVENOUS
  Filled 2018-04-10: qty 50

## 2018-04-10 MED ORDER — HEPARIN SODIUM (PORCINE) 5000 UNIT/ML IJ SOLN
5000.0000 [IU] | Freq: Three times a day (TID) | INTRAMUSCULAR | Status: DC
  Administered 2018-04-10 – 2018-04-14 (×13): 5000 [IU] via SUBCUTANEOUS
  Filled 2018-04-10 (×14): qty 1

## 2018-04-10 NOTE — Progress Notes (Signed)
Per pharmacy keep heparin running at 9

## 2018-04-10 NOTE — Progress Notes (Signed)
PROGRESS NOTE    NALIA Schneider  WFU:[id number] DOB: [birth year] DOA: 03/22/2018 PCP: Elby Showers, MD    Brief Narrative:  Teresa Schneider is a 72 y.o. female with history h/o alcoholic liver disease?  Varices, ascites, depression/ADD, sleep apnea noncompliant with CPAP, chronic iron deficiency anemia who apparently lives by herself brought in by friend after found to be confused and disoriented.     Per ED report, patient's friend who lives next-door found her sitting in a chair confused.  She was apparently at her baseline previous day when visited by the same person.  Patient does have a history of heavy alcohol use.  It is unclear if she still drinks alcohol.  Work-up in the ED revealed abnormal EKG with lateral T wave inversions and lab work showing elevated troponin, leukocytosis, elevated liver enzymes, ammonia level, CK and lactate.  Patient initiated on sepsis protocol by ED and initiated on empiric antibiotics.  She underwent CT abdomen which showed gallstones as well as 3 mm CBD stone.  Was started on vancomycin, cefepime and Flagyl.  Patient evaluated by cardiology and started on rectal aspirin/heparin drip.  GI following.     Assessment & Plan:   Principal Problem:   Sepsis ([place]) Active Problems:   Elevated troponin   AMS (altered mental status)   Malnutrition of moderate degree   1.  Acute metabolic encephalopathy: Secondary to hepatic encephalopathy versus sepsis versus CVA.  CT head negative for any acute findings.  Ammonia level elevated.  Patient currently not safe for oral intake with high aspiration risk.   -Continue lactulose enema and IV fluids/antibiotics.   - Alcohol level less than 10, does not appear to be in acute withdrawal although postictal state from his seizure could be a possibility.  IV thiamine.  Consider NG tube for meds including lactulose if okay per GI.  04/10/18: -Patient's mental status improved.  Currently she is able to talk but still  confused. -Since her mental status improved we will hold off on the MRI. -We will consult speech/swallow evaluation. -Continue to monitor ammonia. -For now continue rectal lactulose.  Once cleared by speech we can transition to oral lactulose.  2.  Sepsis syndrome, abdominal tenderness: Patient has elevated lactate, leukocytosis at 19,000 and CT abdomen shows evidence of gallstones and CBD 3 mm stone which also correlates with elevated LFTs.  - Treating empirically for cholangitis.  GI following.  N.p.o., IV fluids (moderate rate given history of ascites/liver disease) and continue cefepime.  Blood cultures NGTD.  Patient did receive 1 dose of vancomycin in the ED.   - Blood pressure improved.    3. Troponemia: No report of chest pain but patient unable to give history currently. Patient does have abnormal EKG although could be LVH related.  Echo ordered.  Appreciate cardiology evaluation who feel demand ischemia secondary to problem #2.  Patient received rectal aspirin in the ED and heparin drip has been initiated. 04/10/18: -Per cardiology patient is a very poor candidate for heart cath. -They discontinued the heparin drip and recommend medical management.  4.  Alcoholic liver disease with varices/ascites: CT abdomen does report cirrhotic changes and varices.  No signs of bleeding.  Hemoglobin stable.  Elevated lactate could be secondary to liver disease.  Currently no clinical signs of fluid retention.  Albumin 2.7.  5.  Depression/ADD: Hold oral medications for now  6.  Elevated CK: Hold statins.  Unclear how long she was in the chair?  Seizure episode.  -  Continue IV hydration.  04/10/18: -CK improved to 254  - repeat labs in a.m.  DVT prophylaxis: Subcutaneous Heparin due to high risk of DVT.  She also has thrombocytopenia from the liver disease.  Continue to monitor platelets.  If any further downtrending consider switching to SCDs. Code Status: Full Family Communication:  Family at bedside Disposition Plan: Unknown at this time   Consultants:   Cardiology  GI  Procedures:   None  Antimicrobials:   Cefepime    Subjective: She is more verbal today but still confused.  Objective: Vitals:   04/10/18 0017 04/10/18 0419 04/10/18 0541 04/10/18 0755  BP: (!) 152/74 (!) 149/75  (!) 130/59  Pulse:    86  Resp: (!) 32 20 (!) 30 18  Temp: (!) 97.4 F (36.3 C) (!) 97.5 F (36.4 C)    TempSrc: Axillary Axillary    SpO2: 96% 96%  95%  Weight:   50.6 kg   Height:        Intake/Output Summary (Last 24 hours) at 04/10/2018 1201 Last data filed at 04/10/2018 1101 Gross per 24 hour  Intake 3320.66 ml  Output 1150 ml  Net 2170.66 ml   Filed Weights   04/05/2018 1329 04/11/2018 2013 04/10/18 0541  Weight: 51.7 kg 47.9 kg 50.6 kg    Examination:  General exam: confused  Respiratory system: Occasional rhonchi, no wheezing. Respiratory effort normal. Cardiovascular system: S1 & S2 heard, RRR. No JVD, murmurs, rubs, gallops or clicks. No pedal edema. Gastrointestinal system: Abdomen is nondistended, nontender. No organomegaly or masses felt. Normal bowel sounds heard. Central nervous system: Talking some, following few commands but still confused. Extremities: No lower extremity edema. Skin: No rashes Psychiatry: Oriented x1 but otherwise confused   Data Reviewed: I have personally reviewed following labs and imaging studies  CBC: Recent Labs  Lab 04/07/2018 1358 04/09/18 0234 04/10/18 0250  WBC 19.0* 19.2* 19.1*  NEUTROABS 15.0*  --   --   HGB 14.6 13.8 12.5  HCT 46.3* 41.5 39.0  MCV 98.7 95.6 97.5  PLT PLATELET CLUMPS NOTED ON SMEAR, UNABLE TO ESTIMATE PLATELET CLUMPS NOTED ON SMEAR, UNABLE TO ESTIMATE 366*   Basic Metabolic Panel: Recent Labs  Lab 04/10/2018 1358 04/09/18 0234 04/09/18 1120 04/10/18 0250  NA 140 136  --  141  K 3.6 3.4*  --  3.3*  CL 111 106  --  114*  CO2 17* 20*  --  20*  GLUCOSE 144* 106*  --  119*  BUN 21  18  --  32*  CREATININE 0.81 0.74  --  0.74  CALCIUM 9.3 8.3*  --  7.7*  MG  --   --  1.5*  --    GFR: Estimated Creatinine Clearance: 52.3 mL/min (by C-G formula based on SCr of 0.74 mg/dL). Liver Function Tests: Recent Labs  Lab 03/25/2018 1358 04/09/18 0234 04/10/18 0250  AST 108* 95* 70*  ALT 41 38 37  ALKPHOS 131* 118 100  BILITOT 5.0* 4.4* 4.2*  PROT 7.3 6.4* 5.6*  ALBUMIN 3.9 3.2* 2.7*   Recent Labs  Lab 03/22/2018 1358  LIPASE 29   Recent Labs  Lab 03/20/2018 1358 04/09/18 0810 04/10/18 0250  AMMONIA 53* 80* 66*   Coagulation Profile: Recent Labs  Lab 03/21/2018 1548  INR 1.48   Cardiac Enzymes: Recent Labs  Lab 03/27/2018 1358 04/09/18 0810 04/09/18 1120 04/10/18 0250  CKTOTAL 1,223* 460*  --  254*  TROPONINI  --   --  1.50*  --  BNP (last 3 results) No results for input(s): PROBNP in the last 8760 hours. HbA1C: No results for input(s): HGBA1C in the last 72 hours. CBG: Recent Labs  Lab 03/19/2018 1320  GLUCAP 128*   Lipid Profile: No results for input(s): CHOL, HDL, LDLCALC, TRIG, CHOLHDL, LDLDIRECT in the last 72 hours. Thyroid Function Tests: No results for input(s): TSH, T4TOTAL, FREET4, T3FREE, THYROIDAB in the last 72 hours. Anemia Panel: No results for input(s): VITAMINB12, FOLATE, FERRITIN, TIBC, IRON, RETICCTPCT in the last 72 hours. Sepsis Labs: Recent Labs  Lab 03/29/2018 1451 04/07/2018 1804 04/07/2018 1842 04/04/2018 2114  LATICACIDVEN 4.29* 2.9* 3.34* 2.4*    Recent Results (from the past 240 hour(s))  Blood culture (routine x 2)     Status: None (Preliminary result)   Collection Time: 03/18/2018  3:48 PM  Result Value Ref Range Status   Specimen Description BLOOD LEFT ANTECUBITAL  Final   Special Requests   Final    BOTTLES DRAWN AEROBIC AND ANAEROBIC Blood Culture adequate volume   Culture   Final    NO GROWTH < 24 HOURS Performed at [facility], [place] [address], [place], [place] [address]    Report Status PENDING   Incomplete  Blood culture (routine x 2)     Status: None (Preliminary result)   Collection Time: 03/28/2018  3:48 PM  Result Value Ref Range Status   Specimen Description BLOOD BLOOD RIGHT HAND  Final   Special Requests   Final    BOTTLES DRAWN AEROBIC ONLY Blood Culture results may not be optimal due to an inadequate volume of blood received in culture bottles   Culture   Final    NO GROWTH < 24 HOURS Performed at [facility], [place] [address], [place], [place] [address]    Report Status PENDING  Incomplete      Radiology Studies: Ct Head Wo Contrast  Result Date: 04/05/2018 CLINICAL DATA:  Altered mental status. EXAM: CT HEAD WITHOUT CONTRAST TECHNIQUE: Contiguous axial images were obtained from the base of the skull through the vertex without intravenous contrast. COMPARISON:  None. FINDINGS: Brain: Normal appearing cerebral hemispheres and posterior fossa structures. Normal size and position of the ventricles. No intracranial hemorrhage, mass lesion or CT evidence of acute infarction. Vascular: No hyperdense vessel or unexpected calcification. Skull: Normal. Negative for fracture or focal lesion. Sinuses/Orbits: Status post bilateral cataract extraction. Unremarkable paranasal sinuses. 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 IMPRESSION: No acute abnormality. 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 Electronically Signed   By: Claudie Revering M.D.   On: 04/13/2018 14:01   Ct Abdomen Pelvis W Contrast  Result Date: 04/07/2018 CLINICAL DATA:  Abdominal distention, altered mentation and lethargy. History of alcoholism. EXAM: CT ABDOMEN AND PELVIS WITH CONTRAST TECHNIQUE: Multidetector CT imaging of the abdomen and pelvis was performed using the standard protocol following bolus administration of intravenous contrast. CONTRAST:  139mL OMNIPAQUE IOHEXOL 300 MG/ML  SOLN COMPARISON:  07/11/2007 FINDINGS: Lower chest:  Cardiomegaly without pericardial effusion or thickening. Bibasilar dependent atelectasis is noted. Further assessment is somewhat limited by respiratory motion artifacts. Small paraesophageal varices with thickened distal esophagus redemonstrated. Hepatobiliary: Surface nodularity of the liver consistent with morphologic changes of cirrhosis. Tiny too small to characterize hypodensities are noted within the left and right hepatic lobes too small to further characterize but may reflect cysts or hemangiomata. Gallbladder is non thickened but contains layering dependent calculi. There is a 3 mm calculus also noted in the  distal common bile duct, series 3/28 without significant pancreatic nor extrahepatic biliary dilatation noted. Correlate with liver function tests there has been some interval development portal varices. Pancreas: Atrophic pancreas without inflammation. Spleen: The spleen measures 12.1 x 4.8 x 9.7 cm (volume = 290 cm^3), normal in size and is without focal mass. Adrenals/Urinary Tract: Normal bilateral adrenal glands and kidneys. No nephrolithiasis, renal mass nor obstructive uropathy. The urinary bladder is partially obscured by the patient's right hip arthroplasty streak artifacts. No focal mural thickening, calculus or mass is identified of the bladder. Stomach/Bowel: Decompressed stomach. No small nor large bowel obstruction or inflammation. The appendix is not confidently identified but no pericecal inflammation is seen. Vascular/Lymphatic: Aortoiliac and branch vessel atherosclerosis without aneurysm or adenopathy. Reproductive: Atrophic uterus. No adnexal mass. Mild engorgement of the left gonadal vein. Other: No ascites or free air. Musculoskeletal: Right hip arthroplasty. Chronic moderate T11 and mild T12 compression deformities. No acute osseous abnormality. IMPRESSION: 1. 3 mm calculus in the distal common bile duct without significant pancreatic or extrahepatic biliary dilatation. Correlate  with liver function tests. Numerous gallstones are noted within the physiologically distended gallbladder. 2. Surface nodularity of the liver consistent with morphologic changes of cirrhosis. Tiny too small to further characterize hypodensities in the left and right hepatic lobes but statistically more likely to represent cysts or hemangiomata. 3. Chronic mild esophageal thickening along the distal aspect. Esophagitis is not excluded. 4. Small paraesophageal and periportal varices. 5. Aortoiliac and branch vessel atherosclerosis. 6. Chronic moderate T11 and mild T12 compression deformities. Electronically Signed   By: Ashley Royalty M.D.   On: 03/30/2018 15:54   Dg Chest Port 1 View  Result Date: 04/10/2018 CLINICAL DATA:  Altered mental status. EXAM: PORTABLE CHEST 1 VIEW COMPARISON:  10/30/2015 and prior radiographs FINDINGS: The cardiomediastinal silhouette is unremarkable. There is no evidence of focal airspace disease, pulmonary edema, suspicious pulmonary nodule/mass, pleural effusion, or pneumothorax. No acute bony abnormalities are identified. Chronic changes of the RIGHT humeral head again noted. IMPRESSION: No active disease. Electronically Signed   By: Margarette Canada M.D.   On: 04/01/2018 13:54        Scheduled Meds: . aspirin  300 mg Rectal Daily  . heparin  5,000 Units Subcutaneous Q8H  . lactulose  300 mL Rectal BID  . thiamine injection  100 mg Intravenous Daily   Continuous Infusions: . sodium chloride 100 mL/hr at 04/10/18 1101  . ceFEPime (MAXIPIME) IV 2 g (04/09/18 1902)     LOS: 2 days    Time spent: 35 min    Yaakov Guthrie MD [facility] Pager [phone]  If 7PM-7AM, please contact night-coverage [email or web address] Password [email or web address] 04/10/2018, 12:01 PM

## 2018-04-10 NOTE — Progress Notes (Signed)
DAILY PROGRESS NOTE   Patient Name: Teresa Schneider Date of Encounter: 04/10/2018  Chief Complaint   Non-verbal  Patient Profile   RAYVN RICKERSON is a 72 y.o. female with a PMH of alcohol abuse, tobacco abuse, COPD, and mild bilateral carotid artery disease who is being seen today for the evaluation of elevated troponin at the request of Dr. Ronnald Nian.  Subjective   Echo yesterday reassuring with normal LV function and no regional wall motion abnormalities. She remains encephalopathic -probably toxic/metabolic from etoh and elevated ammonia.  Objective   Vitals:   04/10/18 0017 04/10/18 0419 04/10/18 0541 04/10/18 0755  BP: (!) 152/74 (!) 149/75  (!) 130/59  Pulse:    86  Resp: (!) 32 20 (!) 30 18  Temp: (!) 97.4 F (36.3 C) (!) 97.5 F (36.4 C)    TempSrc: Axillary Axillary    SpO2: 96% 96%  95%  Weight:   50.6 kg   Height:        Intake/Output Summary (Last 24 hours) at 04/10/2018 1043 Last data filed at 04/10/2018 1025 Gross per 24 hour  Intake 2790.22 ml  Output 1150 ml  Net 1640.22 ml   Filed Weights   04/14/2018 1329 04/02/2018 2013 04/10/18 0541  Weight: 51.7 kg 47.9 kg 50.6 kg    Physical Exam   General appearance: opens eyes, does not follow commands Lungs: diminished breath sounds bibasilar Heart: regular rate and rhythm Extremities: extremities normal, atraumatic, no cyanosis or edema Neurologic: Mental status: Non-verbal, eyes open, does not follow commands  Inpatient Medications    Scheduled Meds: . aspirin  300 mg Rectal Daily  . lactulose  300 mL Rectal BID  . thiamine injection  100 mg Intravenous Daily    Continuous Infusions: . sodium chloride 100 mL/hr at 04/10/18 0859  . ceFEPime (MAXIPIME) IV 2 g (04/09/18 1902)  . heparin 900 Units/hr (04/10/18 0127)    PRN Meds: acetaminophen **OR** acetaminophen, ondansetron **OR** ondansetron (ZOFRAN) IV   Labs   Results for orders placed or performed during the hospital encounter  of 04/12/2018 (from the past 48 hour(s))  CBG monitoring, ED     Status: Abnormal   Collection Time: 04/06/2018  1:20 PM  Result Value Ref Range   Glucose-Capillary 128 (H) 70 - 99 mg/dL  Comprehensive metabolic panel     Status: Abnormal   Collection Time: 04/01/2018  1:58 PM  Result Value Ref Range   Sodium 140 135 - 145 mmol/L   Potassium 3.6 3.5 - 5.1 mmol/L   Chloride 111 98 - 111 mmol/L   CO2 17 (L) 22 - 32 mmol/L   Glucose, Bld 144 (H) 70 - 99 mg/dL   BUN 21 8 - 23 mg/dL   Creatinine, Ser 0.81 0.44 - 1.00 mg/dL   Calcium 9.3 8.9 - 10.3 mg/dL   Total Protein 7.3 6.5 - 8.1 g/dL   Albumin 3.9 3.5 - 5.0 g/dL   AST 108 (H) 15 - 41 U/L   ALT 41 0 - 44 U/L   Alkaline Phosphatase 131 (H) 38 - 126 U/L   Total Bilirubin 5.0 (H) 0.3 - 1.2 mg/dL   GFR calc non Af Amer >60 >60 mL/min   GFR calc Af Amer >60 >60 mL/min    Comment: (NOTE) The eGFR has been calculated using the CKD EPI equation. This calculation has not been validated in all clinical situations. eGFR's persistently <60 mL/min signify possible Chronic Kidney Disease.    Anion gap 12 5 -  15    Comment: Performed at [facility], [place] [address], [place], [place] [address]  CBC WITH DIFFERENTIAL     Status: Abnormal   Collection Time: 03/28/2018  1:58 PM  Result Value Ref Range   WBC 19.0 (H) 4.0 - 10.5 K/uL   RBC 4.69 3.87 - 5.11 MIL/uL   Hemoglobin 14.6 12.0 - 15.0 g/dL   HCT 46.3 (H) 36.0 - 46.0 %   MCV 98.7 80.0 - 100.0 fL   MCH 31.1 26.0 - 34.0 pg   MCHC 31.5 30.0 - 36.0 g/dL   RDW 16.2 (H) 11.5 - 15.5 %   Platelets PLATELET CLUMPS NOTED ON SMEAR, UNABLE TO ESTIMATE 150 - 400 K/uL    Comment: Immature Platelet Fraction may be clinically indicated, consider ordering this additional test [id number]    nRBC 0.2 0.0 - 0.2 %   Neutrophils Relative % 79 %   Neutro Abs 15.0 (H) 1.7 - 7.7 K/uL   Lymphocytes Relative 11 %   Lymphs Abs 2.1 0.7 - 4.0 K/uL   Monocytes Relative 8 %   Monocytes Absolute 1.6 (H) 0.1 - 1.0  K/uL   Eosinophils Relative 1 %   Eosinophils Absolute 0.2 0.0 - 0.5 K/uL   Basophils Relative 0 %   Basophils Absolute 0.1 0.0 - 0.1 K/uL   Immature Granulocytes 1 %   Abs Immature Granulocytes 0.10 (H) 0.00 - 0.07 K/uL    Comment: Performed at [facility] [address], [place], [place] [address]  Ammonia     Status: Abnormal   Collection Time: 04/01/2018  1:58 PM  Result Value Ref Range   Ammonia 53 (H) 9 - 35 umol/L    Comment: Performed at [facility], [place] [address], [place], [place] [address]  Ethanol     Status: None   Collection Time: 04/11/2018  1:58 PM  Result Value Ref Range   Alcohol, Ethyl (B) <10 <10 mg/dL    Comment: (NOTE) Lowest detectable limit for serum alcohol is 10 mg/dL. For medical purposes only. Performed at [facility], [place] [address], [place], [place] [address]   CK     Status: Abnormal   Collection Time: 04/10/2018  1:58 PM  Result Value Ref Range   Total CK 1,223 (H) 38 - 234 U/L    Comment: Performed at [facility], [place] [address] [place], [place] [address]  Lipase, blood     Status: None   Collection Time: 03/24/2018  1:58 PM  Result Value Ref Range   Lipase 29 11 - 51 U/L    Comment: Performed at [facility] [address] [place],  [address]  I-Stat Troponin, ED (not at [facility])     Status: Abnormal   Collection Time: 04/16/2018  2:49 PM  Result Value Ref Range   Troponin i, poc 3.04 (HH) 0.00 - 0.08 ng/mL   Comment NOTIFIED PHYSICIAN    Comment 3            Comment: Due to the release kinetics of cTnI, a negative result within the first hours of the onset of symptoms does not rule out myocardial infarction with certainty. If myocardial infarction is still suspected, repeat the test at appropriate intervals.   I-Stat CG4 Lactic Acid, ED     Status: Abnormal   Collection Time: 04/07/2018  2:51 PM  Result Value Ref Range   Lactic Acid, Venous 4.29 (HH) 0.5 - 1.9 mmol/L  Comment NOTIFIED PHYSICIAN   Blood  culture (routine x 2)     Status: None (Preliminary result)   Collection Time: 03/23/2018  3:48 PM  Result Value Ref Range   Specimen Description BLOOD LEFT ANTECUBITAL    Special Requests      BOTTLES DRAWN AEROBIC AND ANAEROBIC Blood Culture adequate volume   Culture      NO GROWTH < 24 HOURS Performed at [facility], [address] [place], [place] [address]    Report Status PENDING   Blood culture (routine x 2)     Status: None (Preliminary result)   Collection Time: 03/18/2018  3:48 PM  Result Value Ref Range   Specimen Description BLOOD BLOOD RIGHT HAND    Special Requests      BOTTLES DRAWN AEROBIC ONLY Blood Culture results may not be optimal due to an inadequate volume of blood received in culture bottles   Culture      NO GROWTH < 24 HOURS Performed at [facility] [address], [place], [place] [address]    Report Status PENDING   Protime-INR     Status: Abnormal   Collection Time: 03/19/2018  3:48 PM  Result Value Ref Range   Prothrombin Time 17.8 (H) 11.4 - 15.2 seconds   INR 1.48     Comment: Performed at [facility], [place] [address] [place], [place] [address]  Urinalysis, Complete w Microscopic     Status: Abnormal   Collection Time: 04/13/2018  4:25 PM  Result Value Ref Range   Color, Urine YELLOW YELLOW   APPearance CLEAR CLEAR   Specific Gravity, Urine 1.022 1.005 - 1.030   pH 6.0 5.0 - 8.0   Glucose, UA 50 (A) NEGATIVE mg/dL   Hgb urine dipstick MODERATE (A) NEGATIVE   Bilirubin Urine NEGATIVE NEGATIVE   Ketones, ur NEGATIVE NEGATIVE mg/dL   Protein, ur >=300 (A) NEGATIVE mg/dL   Nitrite NEGATIVE NEGATIVE   Leukocytes, UA NEGATIVE NEGATIVE   RBC / HPF 0-5 0 - 5 RBC/hpf   WBC, UA 0-5 0 - 5 WBC/hpf   Bacteria, UA RARE (A) NONE SEEN   Mucus PRESENT    Hyaline Casts, UA PRESENT     Comment: Performed at [facility] [address], [place], [place] [address]  Urine rapid drug screen (hosp performed)     Status: None   Collection  Time: 04/16/2018  4:25 PM  Result Value Ref Range   Opiates NONE DETECTED NONE DETECTED   Cocaine NONE DETECTED NONE DETECTED   Benzodiazepines NONE DETECTED NONE DETECTED   Amphetamines NONE DETECTED NONE DETECTED   Tetrahydrocannabinol NONE DETECTED NONE DETECTED   Barbiturates NONE DETECTED NONE DETECTED    Comment: (NOTE) DRUG SCREEN FOR MEDICAL PURPOSES ONLY.  IF CONFIRMATION IS NEEDED FOR ANY PURPOSE, NOTIFY LAB WITHIN 5 DAYS. LOWEST DETECTABLE LIMITS FOR URINE DRUG SCREEN Drug Class                     Cutoff (ng/mL) Amphetamine and metabolites    1000 Barbiturate and metabolites    200 Benzodiazepine                 588 Tricyclics and metabolites     300 Opiates and metabolites        300 Cocaine and metabolites        300 THC  50 Performed at [facility], [place] [address] [place], [place] [address]   HIV antibody (Routine Testing)     Status: None   Collection Time: 03/18/2018  6:00 PM  Result Value Ref Range   HIV Screen 4th Generation wRfx Non Reactive Non Reactive    Comment: (NOTE) Performed At: [facility] [place], [place] [id number] Rush Farmer MD [id number]   Brain natriuretic peptide     Status: Abnormal   Collection Time: 03/29/2018  6:02 PM  Result Value Ref Range   B Natriuretic Peptide 3,145.6 (H) 0.0 - 100.0 pg/mL    Comment: Performed at [facility] [address], [place], [place] [address]  Lactic acid, plasma     Status: Abnormal   Collection Time: 03/31/2018  6:04 PM  Result Value Ref Range   Lactic Acid, Venous 2.9 (HH) 0.5 - 1.9 mmol/L    Comment: CRITICAL RESULT CALLED TO, READ BACK BY AND VERIFIED WITHNorberto Sorenson 03/29/2018 WBOND Performed at [facility], [place] [address] [place], [place] [address]   I-Stat CG4 Lactic Acid, ED     Status: Abnormal   Collection Time: 03/29/2018  6:42 PM  Result Value Ref Range   Lactic Acid, Venous 3.34 (HH) 0.5 - 1.9 mmol/L    Comment NOTIFIED PHYSICIAN   Lactic acid, plasma     Status: Abnormal   Collection Time: 04/09/2018  9:14 PM  Result Value Ref Range   Lactic Acid, Venous 2.4 (HH) 0.5 - 1.9 mmol/L    Comment: CRITICAL RESULT CALLED TO, READ BACK BY AND VERIFIED WITH: Gwynneth Aliment 03/22/2018 2201 WAYK Performed at [facility], [place] [address] [place], [place] [address]   Heparin level (unfractionated)     Status: Abnormal   Collection Time: 04/09/18  2:34 AM  Result Value Ref Range   Heparin Unfractionated <0.10 (L) 0.30 - 0.70 IU/mL    Comment: (NOTE) If heparin results are below expected values, and patient dosage has  been confirmed, suggest follow up testing of antithrombin III levels. Performed at [facility], [place] [address] [place], [place] [address]   CBC     Status: Abnormal   Collection Time: 04/09/18  2:34 AM  Result Value Ref Range   WBC 19.2 (H) 4.0 - 10.5 K/uL   RBC 4.34 3.87 - 5.11 MIL/uL   Hemoglobin 13.8 12.0 - 15.0 g/dL   HCT 41.5 36.0 - 46.0 %   MCV 95.6 80.0 - 100.0 fL   MCH 31.8 26.0 - 34.0 pg   MCHC 33.3 30.0 - 36.0 g/dL   RDW 15.9 (H) 11.5 - 15.5 %   Platelets PLATELET CLUMPS NOTED ON SMEAR, UNABLE TO ESTIMATE 150 - 400 K/uL    Comment: Immature Platelet Fraction may be clinically indicated, consider ordering this additional test [id number]    nRBC 0.2 0.0 - 0.2 %    Comment: Performed at [facility], [place] [address], [place], [place] [address]  Comprehensive metabolic panel     Status: Abnormal   Collection Time: 04/09/18  2:34 AM  Result Value Ref Range   Sodium 136 135 - 145 mmol/L   Potassium 3.4 (L) 3.5 - 5.1 mmol/L   Chloride 106 98 - 111 mmol/L   CO2 20 (L) 22 - 32 mmol/L   Glucose, Bld 106 (H) 70 - 99 mg/dL   BUN 18 8 - 23 mg/dL   Creatinine, Ser 0.74 0.44 -  1.00 mg/dL   Calcium 8.3 (L) 8.9 - 10.3 mg/dL   Total Protein 6.4 (L) 6.5 - 8.1 g/dL   Albumin 3.2 (L) 3.5 - 5.0 g/dL   AST 95 (H) 15 - 41 U/L   ALT 38 0 - 44 U/L   Alkaline  Phosphatase 118 38 - 126 U/L   Total Bilirubin 4.4 (H) 0.3 - 1.2 mg/dL   GFR calc non Af Amer >60 >60 mL/min   GFR calc Af Amer >60 >60 mL/min    Comment: (NOTE) The eGFR has been calculated using the CKD EPI equation. This calculation has not been validated in all clinical situations. eGFR's persistently <60 mL/min signify possible Chronic Kidney Disease.    Anion gap 10 5 - 15    Comment: Performed at [facility] [address] [place], [place] [address]  Ammonia     Status: Abnormal   Collection Time: 04/09/18  8:10 AM  Result Value Ref Range   Ammonia 80 (H) 9 - 35 umol/L    Comment: Performed at [facility], [place] [address], [place], [place] [address]  CK     Status: Abnormal   Collection Time: 04/09/18  8:10 AM  Result Value Ref Range   Total CK 460 (H) 38 - 234 U/L    Comment: Performed at [facility], [place] [address] [place], [place] [address]  Heparin level (unfractionated)     Status: Abnormal   Collection Time: 04/09/18 11:20 AM  Result Value Ref Range   Heparin Unfractionated 0.13 (L) 0.30 - 0.70 IU/mL    Comment: (NOTE) If heparin results are below expected values, and patient dosage has  been confirmed, suggest follow up testing of antithrombin III levels. Performed at [facility], [place] [address] [place], [place] [address]   Troponin I - Once     Status: Abnormal   Collection Time: 04/09/18 11:20 AM  Result Value Ref Range   Troponin I 1.50 (HH) <0.03 ng/mL    Comment: CRITICAL RESULT CALLED TO, READ BACK BY AND VERIFIED WITH: B PERSON,RN AT 1233 04/09/18 BY L BENFIELD Performed at [facility], [place] [address], [place], [place] [address]   Magnesium     Status: Abnormal   Collection Time: 04/09/18 11:20 AM  Result Value Ref Range   Magnesium 1.5 (L) 1.7 - 2.4 mg/dL    Comment: Performed at [facility] [address], [place], [place] [address]  Heparin level (unfractionated)     Status: None   Collection Time:  04/09/18  7:47 PM  Result Value Ref Range   Heparin Unfractionated 0.35 0.30 - 0.70 IU/mL    Comment: (NOTE) If heparin results are below expected values, and patient dosage has  been confirmed, suggest follow up testing of antithrombin III levels. Performed at [facility], [place] [address], [place], [place] [address]   Heparin level (unfractionated)     Status: None   Collection Time: 04/10/18  2:50 AM  Result Value Ref Range   Heparin Unfractionated 0.32 0.30 - 0.70 IU/mL    Comment: (NOTE) If heparin results are below expected values, and patient dosage has  been confirmed, suggest follow up testing of antithrombin III levels. Performed at [facility], [place] [address] [place], [place] [address]   CBC     Status: Abnormal   Collection Time: 04/10/18  2:50 AM  Result Value Ref Range   WBC 19.1 (H)  4.0 - 10.5 K/uL   RBC 4.00 3.87 - 5.11 MIL/uL   Hemoglobin 12.5 12.0 - 15.0 g/dL   HCT 39.0 36.0 - 46.0 %   MCV 97.5 80.0 - 100.0 fL   MCH 31.3 26.0 - 34.0 pg   MCHC 32.1 30.0 - 36.0 g/dL   RDW 16.1 (H) 11.5 - 15.5 %   Platelets 112 (L) 150 - 400 K/uL    Comment: REPEATED TO VERIFY PLATELET COUNT CONFIRMED BY SMEAR SPECIMEN CHECKED FOR CLOTS Immature Platelet Fraction may be clinically indicated, consider ordering this additional test [id number]    nRBC 0.3 (H) 0.0 - 0.2 %    Comment: Performed at [facility], [place] [address] [place], [place] [address]  Comprehensive metabolic panel     Status: Abnormal   Collection Time: 04/10/18  2:50 AM  Result Value Ref Range   Sodium 141 135 - 145 mmol/L   Potassium 3.3 (L) 3.5 - 5.1 mmol/L   Chloride 114 (H) 98 - 111 mmol/L   CO2 20 (L) 22 - 32 mmol/L   Glucose, Bld 119 (H) 70 - 99 mg/dL   BUN 32 (H) 8 - 23 mg/dL   Creatinine, Ser 0.74 0.44 - 1.00 mg/dL   Calcium 7.7 (L) 8.9 - 10.3 mg/dL   Total Protein 5.6 (L) 6.5 - 8.1 g/dL   Albumin 2.7 (L) 3.5 - 5.0 g/dL   AST 70 (H) 15 - 41 U/L   ALT 37 0 - 44 U/L    Alkaline Phosphatase 100 38 - 126 U/L   Total Bilirubin 4.2 (H) 0.3 - 1.2 mg/dL   GFR calc non Af Amer >60 >60 mL/min   GFR calc Af Amer >60 >60 mL/min    Comment: (NOTE) The eGFR has been calculated using the CKD EPI equation. This calculation has not been validated in all clinical situations. eGFR's persistently <60 mL/min signify possible Chronic Kidney Disease.    Anion gap 7 5 - 15    Comment: Performed at [facility] [address], [place], [place] [address]  Ammonia     Status: Abnormal   Collection Time: 04/10/18  2:50 AM  Result Value Ref Range   Ammonia 66 (H) 9 - 35 umol/L    Comment: Performed at [facility], [place] [address], [place], [place] [address]  CK     Status: Abnormal   Collection Time: 04/10/18  2:50 AM  Result Value Ref Range   Total CK 254 (H) 38 - 234 U/L    Comment: Performed at [facility], [place] [address] [place], [place] [address]    ECG   N/A  Telemetry   Sinus rhythm with TWI's - Personally Reviewed  Radiology    Ct Head Wo Contrast  Result Date: 04/04/2018 CLINICAL DATA:  Altered mental status. EXAM: CT HEAD WITHOUT CONTRAST TECHNIQUE: Contiguous axial images were obtained from the base of the skull through the vertex without intravenous contrast. COMPARISON:  None. FINDINGS: Brain: Normal appearing cerebral hemispheres and posterior fossa structures. Normal size and position of the ventricles. No intracranial hemorrhage, mass lesion or CT evidence of acute infarction. Vascular: No hyperdense vessel or unexpected calcification. Skull: Normal. Negative for fracture or focal lesion. Sinuses/Orbits: Status post bilateral cataract extraction. Unremarkable paranasal sinuses. 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 IMPRESSION: No acute abnormality. 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 Electronically Signed   By: Claudie Revering M.D.   On: 03/19/2018 14:01  Ct Abdomen Pelvis W Contrast  Result Date: 04/10/2018 CLINICAL DATA:  Abdominal distention, altered mentation and lethargy. History of alcoholism. EXAM: CT ABDOMEN AND PELVIS WITH CONTRAST TECHNIQUE: Multidetector CT imaging of the abdomen and pelvis was performed using the standard protocol following bolus administration of intravenous contrast. CONTRAST:  138m OMNIPAQUE IOHEXOL 300 MG/ML  SOLN COMPARISON:  07/11/2007 FINDINGS: Lower chest: Cardiomegaly without pericardial effusion or thickening. Bibasilar dependent atelectasis is noted. Further assessment is somewhat limited by respiratory motion artifacts. Small paraesophageal varices with thickened distal esophagus redemonstrated. Hepatobiliary: Surface nodularity of the liver consistent with morphologic changes of cirrhosis. Tiny too small to characterize hypodensities are noted within the left and right hepatic lobes too small to further characterize but may reflect cysts or hemangiomata. Gallbladder is non thickened but contains layering dependent calculi. There is a 3 mm calculus also noted in the distal common bile duct, series 3/28 without significant pancreatic nor extrahepatic biliary dilatation noted. Correlate with liver function tests there has been some interval development portal varices. Pancreas: Atrophic pancreas without inflammation. Spleen: The spleen measures 12.1 x 4.8 x 9.7 cm (volume = 290 cm^3), normal in size and is without focal mass. Adrenals/Urinary Tract: Normal bilateral adrenal glands and kidneys. No nephrolithiasis, renal mass nor obstructive uropathy. The urinary bladder is partially obscured by the patient's right hip arthroplasty streak artifacts. No focal mural thickening, calculus or mass is identified of the bladder. Stomach/Bowel: Decompressed stomach. No small nor large bowel obstruction or inflammation. The appendix is not confidently identified but no pericecal inflammation is seen. Vascular/Lymphatic: Aortoiliac  and branch vessel atherosclerosis without aneurysm or adenopathy. Reproductive: Atrophic uterus. No adnexal mass. Mild engorgement of the left gonadal vein. Other: No ascites or free air. Musculoskeletal: Right hip arthroplasty. Chronic moderate T11 and mild T12 compression deformities. No acute osseous abnormality. IMPRESSION: 1. 3 mm calculus in the distal common bile duct without significant pancreatic or extrahepatic biliary dilatation. Correlate with liver function tests. Numerous gallstones are noted within the physiologically distended gallbladder. 2. Surface nodularity of the liver consistent with morphologic changes of cirrhosis. Tiny too small to further characterize hypodensities in the left and right hepatic lobes but statistically more likely to represent cysts or hemangiomata. 3. Chronic mild esophageal thickening along the distal aspect. Esophagitis is not excluded. 4. Small paraesophageal and periportal varices. 5. Aortoiliac and branch vessel atherosclerosis. 6. Chronic moderate T11 and mild T12 compression deformities. Electronically Signed   By: DAshley RoyaltyM.D.   On: 03/29/2018 15:54   Dg Chest Port 1 View  Result Date: 04/04/2018 CLINICAL DATA:  Altered mental status. EXAM: PORTABLE CHEST 1 VIEW COMPARISON:  10/30/2015 and prior radiographs FINDINGS: The cardiomediastinal silhouette is unremarkable. There is no evidence of focal airspace disease, pulmonary edema, suspicious pulmonary nodule/mass, pleural effusion, or pneumothorax. No acute bony abnormalities are identified. Chronic changes of the RIGHT humeral head again noted. IMPRESSION: No active disease. Electronically Signed   By: JMargarette CanadaM.D.   On: 03/26/2018 13:54    Cardiac Studies   LV EF: 55% -   60%  ------------------------------------------------------------------- History:   PMH:  Encephalopathy. Sepsis. Alcoholic liver disease. Elevated  Troponin.  ------------------------------------------------------------------- Study Conclusions  - Left ventricle: The cavity size was normal. Wall thickness was   increased in a pattern of mild LVH. Systolic function was normal.   The estimated ejection fraction was in the range of 55% to 60%.   Wall motion was normal; there were no regional wall motion   abnormalities. Doppler  parameters are consistent with abnormal   left ventricular relaxation (grade 1 diastolic dysfunction). - Aortic valve: There was trivial regurgitation. - Mitral valve: Calcified annulus. Mildly thickened leaflets . - Left atrium: The atrium was moderately dilated. - Pulmonary arteries: Systolic pressure was moderately increased.   PA peak pressure: 53 mm Hg (S).  Impressions:  - Normal LV systolic function; mild LVH; mid diastolic dysfunction;   trace AI; moderate LAE; mild TR; moderate pulmonary hypertension.  Assessment   Principal Problem:   Sepsis ([place]) Active Problems:   Elevated troponin   AMS (altered mental status)   Malnutrition of moderate degree   Plan   1. Continues to be encephalopathic. Echo is reassuring with normal EF and no regional wall motion abnormalities. Will d/c heparin - not a cath candidate. Continue medical therapy.  Time Spent Directly with Patient:  I have spent a total of 25 minutes with the patient reviewing hospital notes, telemetry, EKGs, labs and examining the patient as well as establishing an assessment and plan that was discussed personally with the patient.  > 50% of time was spent in direct patient care.  Length of Stay:  LOS: 2 days   Pixie Casino, MD, [facility], [place] Director of the [facility] Lipid Disorders &  Cardiovascular Risk Reduction [facility] Diplomate of the American Board of Clinical Lipidology Attending Cardiologist  Direct Dial: [phone]  Fax: [phone]  Website:  [email or web address]  [place] 04/10/2018, 10:43 AM

## 2018-04-10 NOTE — Progress Notes (Signed)
Pt non verbal, confuse but smile when her name is called. She has been agitated at times. She got lactulose enema with no BM this shift.

## 2018-04-10 NOTE — Evaluation (Signed)
Clinical/Bedside Swallow Evaluation Patient Details  Name: Teresa Schneider MRN: [id number] Date of Birth: 1946-02-14  Today's Date: 04/10/2018 Time: SLP Start Time (ACUTE ONLY): 5643 SLP Stop Time (ACUTE ONLY): 1458 SLP Time Calculation (min) (ACUTE ONLY): 15 min  Past Medical History:  Past Medical History:  Diagnosis Date  . ADD (attention deficit disorder)   . Alcoholic hepatitis   . Anemia    low iron  . Ascites   . Depression   . Palpitations   . Sleep apnea    does not use cpap   Past Surgical History:  Past Surgical History:  Procedure Laterality Date  . COLONOSCOPY    . FRACTURE SURGERY Right    wrist  . JOINT REPLACEMENT Right    hip  . MASTECTOMY Bilateral    2 different times   HPI:  Teresa Schneider is a 72 y.o. female with history h/o alcoholic liver disease?  Varices, ascites, depression/ADD, sleep apnea noncompliant with CPAP, chronic iron deficiency anemia who apparently lives by herself brought in by friend after found to be confused and disoriented. Admitted with acute metabolic encephalopathy vs sepsis vs CVA. CT head negative for acute findings.    Assessment / Plan / Recommendation Clinical Impression   Patient presents with oropharygneal swallow appearing within functional limits with adequate airway protection. She is alert, but confused, processing is slow but she follows basic commands with extended time, and occasionally visual demonstration. She is distractible. She consumed thin liquids repeatedly in excess of 3 oz with no overt signs of aspiration. Tolerated puree without difficulty, although she is unable to self-feed due to confusion. Would initiate clear liquids, medications whole in puree (crush if holding) with full supervision. Anticipate advancement as her mentation improves. Will follow up.    SLP Visit Diagnosis: Dysphagia, unspecified (R13.10)    Aspiration Risk  Mild aspiration risk    Diet Recommendation Thin liquid   Liquid  Administration via: Cup;Straw Medication Administration: Whole meds with puree Supervision: Full supervision/cueing for compensatory strategies Compensations: Slow rate;Small sips/bites Postural Changes: Seated upright at 90 degrees    Other  Recommendations Oral Care Recommendations: Oral care BID   Follow up Recommendations None      Frequency and Duration min 2x/week  1 week       Prognosis Prognosis for Safe Diet Advancement: Good      Swallow Study   General Date of Onset: 03/18/2018 HPI: Teresa Schneider is a 72 y.o. female with history h/o alcoholic liver disease?  Varices, ascites, depression/ADD, sleep apnea noncompliant with CPAP, chronic iron deficiency anemia who apparently lives by herself brought in by friend after found to be confused and disoriented. Admitted with acute metabolic encephalopathy vs sepsis vs CVA. CT head negative for acute findings.  Type of Study: Bedside Swallow Evaluation Previous Swallow Assessment: none in chart Diet Prior to this Study: NPO Temperature Spikes Noted: No Respiratory Status: Room air History of Recent Intubation: No Behavior/Cognition: Alert;Confused;Distractible;Requires cueing Oral Cavity Assessment: Within Functional Limits Oral Care Completed by SLP: No Oral Cavity - Dentition: Adequate natural dentition Vision: Functional for self-feeding Self-Feeding Abilities: Needs assist;Needs set up Patient Positioning: Upright in bed Baseline Vocal Quality: Hoarse;Other (comment)(rough) Volitional Cough: Strong Volitional Swallow: Unable to elicit    Oral/Motor/Sensory Function Overall Oral Motor/Sensory Function: Within functional limits   Ice Chips Ice chips: Within functional limits Presentation: Spoon   Thin Liquid Thin Liquid: Within functional limits Presentation: Cup    Nectar Thick Nectar Thick Liquid: Not  tested   Honey Thick Honey Thick Liquid: Not tested   Puree Puree: Within functional limits Presentation:  Spoon   Solid     Solid: Not tested     Deneise Lever, [place], CCC-SLP Speech-Language Pathologist Acute Rehabilitation Services Pager: [phone] Office: [phone]  Aliene Altes 04/10/2018,2:02 PM

## 2018-04-10 NOTE — Progress Notes (Signed)
Patient perhaps a bit more alert today, but no significant improvement compared to last night.  Ammonia and liver chemistries slightly improved over past 24 hours.  White count elevated at 19,000, but stable.  In the past, I have been able to feel an enlarged left lobe of the liver at times when the patient was actively drinking, whereas at present, I do not feel it.  The patient is unable to cooperate for following instructions, therefore, could not effectively check for asterixis.  Attempts to elicit asterixis passively were unrevealing.  Recommendations: No new suggestions  Cleotis Nipper, M.D. Pager [phone] If no answer or after 5 PM call [phone]

## 2018-04-10 NOTE — Progress Notes (Signed)
[place] for Heparin Indication: chest pain/ACS  Allergies  Allergen Reactions  . Salmon [Fish Allergy] Dermatitis  . Other Dermatitis    Paprika  . Sulfamethoxazole-Trimethoprim     REACTION: rash  . Penicillins Rash  . Pyridium [Phenazopyridine Hcl] Rash    Patient Measurements: Height: 5\' 5"  (165.1 cm)(est. non-verbal) Weight: 111 lb 8.8 oz (50.6 kg) IBW/kg (Calculated) : 57 Heparin Dosing Weight: 51.7 kg  Vital Signs: Temp: 97.5 F (36.4 C) (11/24 0419) Temp Source: Axillary (11/24 0419) BP: 130/59 (11/24 0755) Pulse Rate: 86 (11/24 0755)  Labs: Recent Labs    04/05/2018 1358 04/14/2018 1548  04/09/18 0234 04/09/18 0810 04/09/18 1120 04/09/18 1947 04/10/18 0250  HGB 14.6  --   --  13.8  --   --   --  12.5  HCT 46.3*  --   --  41.5  --   --   --  39.0  PLT PLATELET CLUMPS NOTED ON SMEAR, UNABLE TO ESTIMATE  --   --  PLATELET CLUMPS NOTED ON SMEAR, UNABLE TO ESTIMATE  --   --   --  112*  LABPROT  --  17.8*  --   --   --   --   --   --   INR  --  1.48  --   --   --   --   --   --   HEPARINUNFRC  --   --    < > <0.10*  --  0.13* 0.35 0.32  CREATININE 0.81  --   --  0.74  --   --   --  0.74  CKTOTAL 1,223*  --   --   --  460*  --   --  254*  TROPONINI  --   --   --   --   --  1.50*  --   --    < > = values in this interval not displayed.    Estimated Creatinine Clearance: 52.3 mL/min (by C-G formula based on SCr of 0.74 mg/dL).   Medical History: Past Medical History:  Diagnosis Date  . ADD (attention deficit disorder)   . Alcoholic hepatitis   . Anemia    low iron  . Ascites   . Depression   . Palpitations   . Sleep apnea    does not use cpap    Assessment: Patient 63 yoF presenting with AMS and elevated troponin. Pharmacy has been consulted for heparin dosing. Unable to confirm at this time if patient on anticoagulation PTA due to altered state. Per chart review, there is no history of patient taking  anticoagulation.  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  Heparin level remains therapeutic at 0.32. Hgb 12.5, plts 112. No s/sx of bleeding noted.  Goal of Therapy:   Heparin level 0.3-0.5 units/mL Monitor platelets by anticoagulation protocol: Yes   Plan:  Continue heparin to 900 units/hr Monitor CBCs, s/s of bleeding and heparin levels daily  Gwenlyn Found, [place] D PGY1 Pharmacy Resident  Phone [phone] 04/10/2018   10:06 AM

## 2018-04-11 ENCOUNTER — Telehealth: Payer: Self-pay | Admitting: Internal Medicine

## 2018-04-11 ENCOUNTER — Encounter: Payer: Self-pay | Admitting: Internal Medicine

## 2018-04-11 DIAGNOSIS — R748 Abnormal levels of other serum enzymes: Secondary | ICD-10-CM

## 2018-04-11 LAB — COMPREHENSIVE METABOLIC PANEL
ALBUMIN: 2.5 g/dL — AB (ref 3.5–5.0)
ALT: 34 U/L (ref 0–44)
ANION GAP: 6 (ref 5–15)
AST: 58 U/L — ABNORMAL HIGH (ref 15–41)
Alkaline Phosphatase: 89 U/L (ref 38–126)
BUN: 34 mg/dL — ABNORMAL HIGH (ref 8–23)
CHLORIDE: 118 mmol/L — AB (ref 98–111)
CO2: 18 mmol/L — AB (ref 22–32)
Calcium: 7.5 mg/dL — ABNORMAL LOW (ref 8.9–10.3)
Creatinine, Ser: 0.75 mg/dL (ref 0.44–1.00)
GFR calc non Af Amer: >60 mL/min (ref >60)
Glucose, Bld: 101 mg/dL — ABNORMAL HIGH (ref 70–99)
POTASSIUM: 3.3 mmol/L — AB (ref 3.5–5.1)
SODIUM: 142 mmol/L (ref 135–145)
Total Bilirubin: 3.3 mg/dL — ABNORMAL HIGH (ref 0.3–1.2)
Total Protein: 4.8 g/dL — ABNORMAL LOW (ref 6.5–8.1)

## 2018-04-11 LAB — CBC
HCT: 33.6 % — ABNORMAL LOW (ref 36.0–46.0)
HEMOGLOBIN: 11 g/dL — AB (ref 12.0–15.0)
MCH: 32.4 pg (ref 26.0–34.0)
MCHC: 32.7 g/dL (ref 30.0–36.0)
MCV: 99.1 fL (ref 80.0–100.0)
NRBC: 0.4 % — AB (ref 0.0–0.2)
Platelets: 94 10*3/uL — ABNORMAL LOW (ref 150–400)
RBC: 3.39 MIL/uL — AB (ref 3.87–5.11)
RDW: 16.7 % — ABNORMAL HIGH (ref 11.5–15.5)
WBC: 22.5 10*3/uL — AB (ref 4.0–10.5)

## 2018-04-11 LAB — CK: Total CK: 147 U/L (ref 38–234)

## 2018-04-11 LAB — AMMONIA: Ammonia: 49 umol/L — ABNORMAL HIGH (ref 9–35)

## 2018-04-11 MED ORDER — ADULT MULTIVITAMIN LIQUID CH
15.0000 mL | Freq: Every day | ORAL | Status: DC
  Administered 2018-04-11: 15 mL via ORAL
  Filled 2018-04-11: qty 15

## 2018-04-11 MED ORDER — ADULT MULTIVITAMIN W/MINERALS CH
1.0000 | ORAL_TABLET | Freq: Every day | ORAL | Status: DC
  Administered 2018-04-11: 1 via ORAL
  Filled 2018-04-11: qty 1

## 2018-04-11 MED ORDER — ACETAMINOPHEN 500 MG PO TABS: 500.0000 mg | ORAL_TABLET | Freq: Four times a day (QID) | ORAL | Status: DC | PRN

## 2018-04-11 MED ORDER — DEXTROAMPHETAMINE SULFATE 5 MG PO TABS
10.0000 mg | ORAL_TABLET | Freq: Four times a day (QID) | ORAL | Status: DC
  Administered 2018-04-11 – 2018-04-14 (×6): 10 mg via ORAL
  Filled 2018-04-11 (×6): qty 2

## 2018-04-11 MED ORDER — FOLIC ACID 5 MG/ML IJ SOLN
1.0000 mg | Freq: Every day | INTRAMUSCULAR | Status: DC
  Administered 2018-04-11: 1 mg via INTRAVENOUS
  Filled 2018-04-11 (×2): qty 0.2

## 2018-04-11 MED ORDER — LORAZEPAM 2 MG/ML IJ SOLN
2.0000 mg | INTRAMUSCULAR | Status: DC | PRN
  Administered 2018-04-12: 2 mg via INTRAVENOUS
  Filled 2018-04-11: qty 1

## 2018-04-11 MED ORDER — FOLIC ACID 1 MG PO TABS: 1.0000 mg | ORAL_TABLET | Freq: Every day | ORAL | Status: DC

## 2018-04-11 MED ORDER — RIFAXIMIN 550 MG PO TABS
550.0000 mg | ORAL_TABLET | Freq: Two times a day (BID) | ORAL | Status: DC
  Administered 2018-04-11 – 2018-04-14 (×5): 550 mg via ORAL
  Filled 2018-04-11 (×7): qty 1

## 2018-04-11 MED ORDER — ASPIRIN 325 MG PO TABS
325.0000 mg | ORAL_TABLET | Freq: Every day | ORAL | Status: DC
  Administered 2018-04-11: 325 mg via ORAL
  Filled 2018-04-11: qty 1

## 2018-04-11 MED ORDER — ENSURE ENLIVE PO LIQD
237.0000 mL | Freq: Two times a day (BID) | ORAL | Status: DC
  Administered 2018-04-11 – 2018-04-13 (×2): 237 mL via ORAL

## 2018-04-11 MED ORDER — SENNOSIDES-DOCUSATE SODIUM 8.6-50 MG PO TABS
1.0000 | ORAL_TABLET | Freq: Two times a day (BID) | ORAL | Status: DC
  Administered 2018-04-11: 1 via ORAL
  Filled 2018-04-11: qty 1

## 2018-04-11 MED ORDER — LACTULOSE 10 GM/15ML PO SOLN
10.0000 g | Freq: Two times a day (BID) | ORAL | Status: DC
  Administered 2018-04-11 (×2): 10 g via ORAL
  Filled 2018-04-11 (×2): qty 15

## 2018-04-11 MED ORDER — VITAMIN B-1 100 MG PO TABS: 100.0000 mg | ORAL_TABLET | Freq: Every day | ORAL | Status: DC

## 2018-04-11 MED ORDER — DEXTROAMPHETAMINE SULFATE 5 MG PO TABS: 10.0000 mg | ORAL_TABLET | Freq: Four times a day (QID) | ORAL | Status: DC

## 2018-04-11 NOTE — Progress Notes (Addendum)
Subjective: The patient was seen and examined at bedside. She seems more awake than yesterday and is able to follow some commands  Intermittently. She is aware that she is at [facility], can give me her date of birth and is aware of who the current president is.  Objective: Vital signs in last 24 hours: Temp:  [97.5 F (36.4 C)-97.7 F (36.5 C)] 97.5 F (36.4 C) (11/25 0355) Pulse Rate:  [71-95] 71 (11/25 0400) Resp:  [20-30] 30 (11/25 0400) BP: (128-161)/(59-112) 161/112 (11/25 0400) SpO2:  [94 %-96 %] 94 % (11/25 0400) Weight:  [52.4 kg] 52.4 kg (11/25 0355) Weight change: 1.8 kg Last BM Date: 04/10/18  WJ:[id number] restless GENERAL:mild icterus, mild pallor ABDOMEN:mild distention, minimal shifting dullness, normoactive bowel sounds EXTREMITIES:no deformity  Lab Results: Results for orders placed or performed during the hospital encounter of 03/25/2018 (from the past 48 hour(s))  Heparin level (unfractionated)     Status: Abnormal   Collection Time: 04/09/18 11:20 AM  Result Value Ref Range   Heparin Unfractionated 0.13 (L) 0.30 - 0.70 IU/mL    Comment: (NOTE) If heparin results are below expected values, and patient dosage has  been confirmed, suggest follow up testing of antithrombin III levels. Performed at [facility], [place] [address] [place], [place] [address]   Troponin I - Once     Status: Abnormal   Collection Time: 04/09/18 11:20 AM  Result Value Ref Range   Troponin I 1.50 (HH) <0.03 ng/mL    Comment: CRITICAL RESULT CALLED TO, READ BACK BY AND VERIFIED WITH: B PERSON,RN AT 1233 04/09/18 BY L BENFIELD Performed at [facility], [place] [address] [place], [place] [address]   Magnesium     Status: Abnormal   Collection Time: 04/09/18 11:20 AM  Result Value Ref Range   Magnesium 1.5 (L) 1.7 - 2.4 mg/dL    Comment: Performed at [facility] [address] [place], [place] [address]  Heparin level (unfractionated)     Status: None   Collection  Time: 04/09/18  7:47 PM  Result Value Ref Range   Heparin Unfractionated 0.35 0.30 - 0.70 IU/mL    Comment: (NOTE) If heparin results are below expected values, and patient dosage has  been confirmed, suggest follow up testing of antithrombin III levels. Performed at [facility], [place] [address] [place], [place] [address]   Heparin level (unfractionated)     Status: None   Collection Time: 04/10/18  2:50 AM  Result Value Ref Range   Heparin Unfractionated 0.32 0.30 - 0.70 IU/mL    Comment: (NOTE) If heparin results are below expected values, and patient dosage has  been confirmed, suggest follow up testing of antithrombin III levels. Performed at [facility], [place] [address], [place], [place] [address]   CBC     Status: Abnormal   Collection Time: 04/10/18  2:50 AM  Result Value Ref Range   WBC 19.1 (H) 4.0 - 10.5 K/uL   RBC 4.00 3.87 - 5.11 MIL/uL   Hemoglobin 12.5 12.0 - 15.0 g/dL   HCT 39.0 36.0 - 46.0 %   MCV 97.5 80.0 - 100.0 fL   MCH 31.3 26.0 - 34.0 pg   MCHC 32.1 30.0 - 36.0 g/dL   RDW 16.1 (H) 11.5 - 15.5 %   Platelets 112 (L) 150 - 400 K/uL    Comment: REPEATED TO VERIFY PLATELET COUNT CONFIRMED BY SMEAR SPECIMEN CHECKED FOR CLOTS Immature Platelet Fraction  may be clinically indicated, consider ordering this additional test [id number]    nRBC 0.3 (H) 0.0 - 0.2 %    Comment: Performed at [facility], [place] [address] [place], [place] [address]  Comprehensive metabolic panel     Status: Abnormal   Collection Time: 04/10/18  2:50 AM  Result Value Ref Range   Sodium 141 135 - 145 mmol/L   Potassium 3.3 (L) 3.5 - 5.1 mmol/L   Chloride 114 (H) 98 - 111 mmol/L   CO2 20 (L) 22 - 32 mmol/L   Glucose, Bld 119 (H) 70 - 99 mg/dL   BUN 32 (H) 8 - 23 mg/dL   Creatinine, Ser 0.74 0.44 - 1.00 mg/dL   Calcium 7.7 (L) 8.9 - 10.3 mg/dL   Total Protein 5.6 (L) 6.5 - 8.1 g/dL   Albumin 2.7 (L) 3.5 - 5.0 g/dL   AST 70 (H) 15 - 41 U/L   ALT 37 0 - 44 U/L    Alkaline Phosphatase 100 38 - 126 U/L   Total Bilirubin 4.2 (H) 0.3 - 1.2 mg/dL   GFR calc non Af Amer >60 >60 mL/min   GFR calc Af Amer >60 >60 mL/min    Comment: (NOTE) The eGFR has been calculated using the CKD EPI equation. This calculation has not been validated in all clinical situations. eGFR's persistently <60 mL/min signify possible Chronic Kidney Disease.    Anion gap 7 5 - 15    Comment: Performed at [facility] [address] [place], [place] [address]  Ammonia     Status: Abnormal   Collection Time: 04/10/18  2:50 AM  Result Value Ref Range   Ammonia 66 (H) 9 - 35 umol/L    Comment: Performed at [facility], [place] [address], [place], [place] [address]  CK     Status: Abnormal   Collection Time: 04/10/18  2:50 AM  Result Value Ref Range   Total CK 254 (H) 38 - 234 U/L    Comment: Performed at [facility], [place] [address], [place], [place] [address]  Comprehensive metabolic panel     Status: Abnormal   Collection Time: 04/11/18  3:23 AM  Result Value Ref Range   Sodium 142 135 - 145 mmol/L   Potassium 3.3 (L) 3.5 - 5.1 mmol/L   Chloride 118 (H) 98 - 111 mmol/L   CO2 18 (L) 22 - 32 mmol/L   Glucose, Bld 101 (H) 70 - 99 mg/dL   BUN 34 (H) 8 - 23 mg/dL   Creatinine, Ser 0.75 0.44 - 1.00 mg/dL   Calcium 7.5 (L) 8.9 - 10.3 mg/dL   Total Protein 4.8 (L) 6.5 - 8.1 g/dL   Albumin 2.5 (L) 3.5 - 5.0 g/dL   AST 58 (H) 15 - 41 U/L   ALT 34 0 - 44 U/L   Alkaline Phosphatase 89 38 - 126 U/L   Total Bilirubin 3.3 (H) 0.3 - 1.2 mg/dL   GFR calc non Af Amer >60 >60 mL/min   GFR calc Af Amer >60 >60 mL/min    Comment: (NOTE) The eGFR has been calculated using the CKD EPI equation. This calculation has not been validated in all clinical situations. eGFR's persistently <60 mL/min signify possible Chronic Kidney Disease.    Anion gap 6 5 - 15    Comment: Performed at [facility] [address], [place], [place] [address]  Ammonia     Status:  Abnormal  Collection Time: 04/11/18  3:23 AM  Result Value Ref Range   Ammonia 49 (H) 9 - 35 umol/L    Comment: Performed at [facility], [place] [address], [place], [place] [address]  CK     Status: None   Collection Time: 04/11/18  3:23 AM  Result Value Ref Range   Total CK 147 38 - 234 U/L    Comment: Performed at [facility], [place] [address] [place], [place] [address]  CBC     Status: Abnormal   Collection Time: 04/11/18  7:21 AM  Result Value Ref Range   WBC 22.5 (H) 4.0 - 10.5 K/uL   RBC 3.39 (L) 3.87 - 5.11 MIL/uL   Hemoglobin 11.0 (L) 12.0 - 15.0 g/dL   HCT 33.6 (L) 36.0 - 46.0 %   MCV 99.1 80.0 - 100.0 fL   MCH 32.4 26.0 - 34.0 pg   MCHC 32.7 30.0 - 36.0 g/dL   RDW 16.7 (H) 11.5 - 15.5 %   Platelets 94 (L) 150 - 400 K/uL    Comment: REPEATED TO VERIFY Immature Platelet Fraction may be clinically indicated, consider ordering this additional test [id number] CONSISTENT WITH PREVIOUS RESULT    nRBC 0.4 (H) 0.0 - 0.2 %    Comment: Performed at [facility], [place] [address], [place], [place] [address]    Studies/Results: No results found.  Medications: I have reviewed the patient's current medications.  Assessment: Worsening leukocytosis, 22.5 today Minimal improvement in T bili from 4.2 to 3.3 Hypokalemia, mild acidosis, elevated BUN/creatinine ratio of 34/0.75, elevated AST AST ratio of 58/34, elevated ammonia 49 INR was 1.48 on admission  CT scan showed small paraesophageal varices with thickened distal esophagus, cirrhosis,gallstones, 3 mm CBD calculus without extra hepatic dilatation,atrophic pancreas  Plan: Continue supportive treatment, advance diet to full liquids We will start patient on lactulose and Xifaxan for possible hepatic encephalopathy Potassium and magnesium replacement as needed On thiamine, will start on folic acid and MVI.   Ronnette Juniper 04/11/2018, 8:46 AM   Pager [phone] If no answer or after 5 PM call  [phone]

## 2018-04-11 NOTE — Progress Notes (Signed)
Pharmacy Antibiotic Note  Teresa Schneider is a 72 y.o. female admitted on 03/25/2018 with acute encephalopathy and concern for sepsis.  Pharmacy has been consulted for cefepime dosing. Cultures negative, renal function is stable, white count remains high.   Plan: -Increase cefepime to 2g IV q12h -Monitor LOT, cultures, renal function   Height: 5\' 5"  (165.1 cm)(est. non-verbal) Weight: 115 lb 8.3 oz (52.4 kg) IBW/kg (Calculated) : 57  Temp (24hrs), Avg:97.7 F (36.5 C), Min:97.5 F (36.4 C), Max:97.8 F (36.6 C)  Recent Labs  Lab 03/31/2018 1358 03/18/2018 1451 04/10/2018 1804 04/02/2018 1842 04/02/2018 2114 04/09/18 0234 04/10/18 0250 04/11/18 0323 04/11/18 0721  WBC 19.0*  --   --   --   --  19.2* 19.1*  --  22.5*  CREATININE 0.81  --   --   --   --  0.74 0.74 0.75  --   LATICACIDVEN  --  4.29* 2.9* 3.34* 2.4*  --   --   --   --     Estimated Creatinine Clearance: 54.1 mL/min (by C-G formula based on SCr of 0.75 mg/dL).    Allergies  Allergen Reactions  . Salmon [Fish Allergy] Dermatitis  . Other Dermatitis    Paprika  . Sulfamethoxazole-Trimethoprim     REACTION: rash  . Penicillins Rash  . Pyridium [Phenazopyridine Hcl] Rash    Antimicrobials this admission: Vancomycin 11/22 x1 Metronidazole 11/22 x1 Cefepime 11/22>>  Microbiology results: 11/22 BCx: no growth to date  Thank you for allowing pharmacy to be a part of this patient's care.  Arrie Senate, PharmD, BCPS Clinical Pharmacist [phone] Please check AMION for all [place] numbers 04/11/2018

## 2018-04-11 NOTE — Progress Notes (Signed)
Nutrition Follow-up  DOCUMENTATION CODES:   Non-severe (moderate) malnutrition in context of chronic illness  INTERVENTION:   - Add Ensure Enlive BID (each provides 350 kcal and 20 g protein) - Replace liquid MVI with MVI with minerals    NUTRITION DIAGNOSIS:   Moderate Malnutrition related to chronic illness as evidenced by mild fat depletion, moderate fat depletion, mild muscle depletion, moderate muscle depletion.  Ongoing  GOAL:   Patient will meet greater than or equal to 90% of their needs  No meal completion data available   MONITOR:   PO intake, Supplement acceptance, Diet advancement, Weight trends, Labs   Diet advancement: advanced to CL diet on 11/24, to FL on 11/25 Labs: potassium 3.3 (trending down), chloride 118 (trending up), glucose 101 (variable), BUN 34 (trending up), AST 58 (trending down), tProtein 4.8 (trending down), ammonia 49 (trending down), tBili 3.3 (trending down), Hgb 11.0 (trending down), Hct 33.6% (trending down)  Weight trends: 52.4 kg, up 4.5 kg since 11/23 Skin: nothing of note  Intake/Output Summary (Last 24 hours) at 04/11/2018 1258 Last data filed at 04/11/2018 0859 Gross per 24 hour  Intake 1398.97 ml  Output 950 ml  Net 448.97 ml    ASSESSMENT:   Teresa Schneider is a 72 y.o. female with history h/o alcoholic liver disease,?  Varices, ascites, depression/ADD, sleep apnea noncompliant with CPAP, chronic iron deficiency anemia who apparently lives by herself brought in by friend after found to be confused and disoriented.  Currently patient nonverbal and just nods her head when called her name.  No family or friends present bedside.  I tried calling her emergency contact listed in the medical record but no response.  Most of the history obtained by talking to ED physician and chart review.  11/25 follow up:  Meds: MVI liquid 15 mL daily, folic acid injection 1 mg daily, thiamine injection 100 mg daily  Pt awake with lunch tray at  time of visit. Unable to indicate whether hungry or not, or what flavor supplement she would prefer. Brought pt Ensure Enlive in chocolate and offered her a sip, which she declined. Will order BID given limiting FL diet and dx of PCM.   Diet Order:  No documentation Diet Order            Diet full liquid Room service appropriate? Yes; Fluid consistency: Thin  Diet effective now              EDUCATION NEEDS:  Not appropriate for education at this time  Skin:  Skin Assessment: Reviewed RN Assessment  Last BM:  11/24, type 7  Height:  Ht Readings from Last 1 Encounters:  04/06/2018 5\' 5"  (1.651 m)    Weight:  Wt Readings from Last 1 Encounters:  04/11/18 52.4 kg    Ideal Body Weight:  56.8 kg  BMI:  Body mass index is 19.22 kg/m.  Estimated Nutritional Needs:   Kcal:  1600-1800  Protein:  80-95 grams  Fluid:  > 1.6 L   Althea Grimmer, MS, RDN, LDN Pager: [phone]

## 2018-04-11 NOTE — Progress Notes (Signed)
See yesterday's note from Dr. Debara Pickett for details.   [facility] will sign off.   Medication Recommendations:  ASA, resume statin once stable LFTS Other recommendations (labs, testing, etc):  none Follow up as an outpatient:  Follow up with Dr. Debara Pickett 2-4 weeks post discharge.

## 2018-04-11 NOTE — Plan of Care (Signed)
  Problem: Clinical Measurements: Goal: Diagnostic test results will improve Outcome: Progressing Goal: Respiratory complications will improve Outcome: Progressing   Problem: Nutrition: Goal: Adequate nutrition will be maintained Outcome: Progressing   Problem: Coping: Goal: Level of anxiety will decrease Outcome: Progressing   Problem: Elimination: Goal: Will not experience complications related to bowel motility Outcome: Progressing   Problem: Pain Managment: Goal: General experience of comfort will improve Outcome: Progressing

## 2018-04-11 NOTE — Progress Notes (Signed)
  Speech Language Pathology Treatment: Dysphagia  Patient Details Name: Teresa Schneider MRN: [id number] DOB: [birth year] Today's Date: 04/11/2018 Time: 7209-4709 SLP Time Calculation (min) (ACUTE ONLY): 16 min  Assessment / Plan / Recommendation Clinical Impression  Pt is not able to upgrade from full liquids at present. She was awake with altered cognition and accepting of po's. Holding and swishing of liquids observed as sitter reported, with additional time to propel. Mildly prolonged mastication with solid texture and no s/s aspiration during session. Prognosis for upgrade good once cognition improves but continuation of full liquids is safest and most efficient.    HPI HPI: Teresa Schneider is a 72 y.o. female with history h/o alcoholic liver disease?  Varices, ascites, depression/ADD, sleep apnea noncompliant with CPAP, chronic iron deficiency anemia who apparently lives by herself brought in by friend after found to be confused and disoriented. Admitted with acute metabolic encephalopathy vs sepsis vs CVA. CT head negative for acute findings.       SLP Plan  Continue with current plan of care       Recommendations  Diet recommendations: Thin liquid(full liquids) Liquids provided via: Straw;Cup Medication Administration: Crushed with puree Supervision: Patient able to self feed;Full supervision/cueing for compensatory strategies Compensations: Slow rate;Small sips/bites;Lingual sweep for clearance of pocketing Postural Changes and/or Swallow Maneuvers: Seated upright 90 degrees                Oral Care Recommendations: Oral care BID Follow up Recommendations: (TBD) SLP Visit Diagnosis: Dysphagia, oral phase (R13.11) Plan: Continue with current plan of care       GO                Houston Siren 04/11/2018, 4:50 PM  Orbie Pyo Amnah Breuer M.Ed Risk analyst [phone] Office [phone]

## 2018-04-11 NOTE — Telephone Encounter (Signed)
Bonnye Fava, patient's brother who lives in [place] was contacted by Dr. Janet Berlin about patient and given an update. He called here today for some advice and direction. They are not close. Have not seen each other in 5 years but talk by phone a couple of times a year. Was advised to contact Hospitalists. He will try to come to hospital in next couple of days  His phone number is [phone]

## 2018-04-11 NOTE — Progress Notes (Signed)
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    Work-up in the ED revealed an abnormal EKG with lateral T wave inversions and lab work showing elevated troponin, leukocytosis, elevated liver enzymes, ammonia level, CK and lactate. CT abdomen showed gallstones as well as a 3 mm CBD stone.     Subjective: The patient is waking up more but is becoming agitated and quite impulsive today.  She cancer some very simple questions but cannot provide a detailed history.  She denies specific focused complaints but indicates she doesn't feel good in general.  Assessment & Plan:  Acute metabolic encephalopathy hepatic encephalopathy versus sepsis versus CVA - CT head negative for acute findings - Ammonia level elevated - presently appears to be entering EtOH withdrawal - CIWA initiated   Sepsis syndrome, abdominal tenderness - ? Cholangitis abdom is now benign - no evidence of an acute abdom process - GI has evaluated   Troponemia Cardiology feel this is c/w demand ischemia - TTE w/ normal EF and no WMA - pt is not a candidate for further cardiac w/u   Alcoholic liver disease with varices/ascites  Moderate malnutrition in context of chronic illness / Underweight Nutrition following - diet being advanced   DVT prophylaxis: SQ heparin  Code Status: FULL CODE Family Communication: no family present at time of exam  Disposition Plan: SDU  Consultants:  GI Cardiology   Antimicrobials:  Cefepime  11/22 > 11/24  Objective: Blood pressure (!) 126/96, pulse 92, temperature 97.9 F (36.6 C), temperature source Oral, resp. rate (!) 24, height 5\' 5"   (1.651 m), weight 52.4 kg, SpO2 93 %.  Intake/Output Summary (Last 24 hours) at 04/11/2018 1358 Last data filed at 04/11/2018 0859 Gross per 24 hour  Intake 1219.73 ml  Output 950 ml  Net 269.73 ml   Filed Weights   03/21/2018 2013 04/10/18 0541 04/11/18 0355  Weight: 47.9 kg 50.6 kg 52.4 kg    Examination: General: No acute respiratory distress Lungs: Clear to auscultation bilaterally without wheezes or crackles Cardiovascular: Regular rate and rhythm without murmur gallop or rub normal S1 and S2 Abdomen: Nontender, nondistended, soft, bowel sounds positive, no rebound, no ascites, no appreciable mass Extremities: No significant cyanosis, clubbing, or edema bilateral lower extremities  CBC: Recent Labs  Lab 03/31/2018 1358 04/09/18 0234 04/10/18 0250 04/11/18 0721  WBC 19.0* 19.2* 19.1* 22.5*  NEUTROABS 15.0*  --   --   --   HGB 14.6 13.8 12.5 11.0*  HCT 46.3* 41.5 39.0 33.6*  MCV 98.7 95.6 97.5 99.1  PLT PLATELET CLUMPS NOTED ON SMEAR, UNABLE TO ESTIMATE PLATELET CLUMPS NOTED ON SMEAR, UNABLE TO ESTIMATE 112* 94*   Basic Metabolic Panel: Recent Labs  Lab 04/09/18 0234 04/09/18 1120 04/10/18 0250 04/11/18 0323  NA 136  --  141 142  K 3.4*  --  3.3* 3.3*  CL 106  --  114* 118*  CO2 20*  --  20* 18*  GLUCOSE 106*  --  119* 101*  BUN 18  --  32* 34*  CREATININE 0.74  --  0.74 0.75  CALCIUM 8.3*  --  7.7* 7.5*  MG  --  1.5*  --   --    GFR: Estimated Creatinine Clearance: 54.1 mL/min (by C-G formula based on SCr of 0.75 mg/dL).  Liver Function Tests: Recent Labs  Lab 04/12/2018 1358 04/09/18 0234 04/10/18 0250 04/11/18 0323  AST 108* 95* 70* 58*  ALT 41 38 37 34  ALKPHOS 131* 118 100 89  BILITOT 5.0* 4.4* 4.2* 3.3*  PROT 7.3 6.4* 5.6* 4.8*  ALBUMIN 3.9 3.2* 2.7* 2.5*   Recent Labs  Lab 04/16/2018 1358  LIPASE 29   Recent Labs  Lab 04/10/2018 1358 04/09/18 0810 04/10/18 0250 04/11/18 0323  AMMONIA 53* 80* 66* 49*    Coagulation Profile: Recent  Labs  Lab 04/04/2018 1548  INR 1.48    Cardiac Enzymes: Recent Labs  Lab 04/03/2018 1358 04/09/18 0810 04/09/18 1120 04/10/18 0250 04/11/18 0323  CKTOTAL 1,223* 460*  --  254* 147  TROPONINI  --   --  1.50*  --   --    CBG: Recent Labs  Lab 04/03/2018 1320  GLUCAP 128*    Recent Results (from the past 240 hour(s))  Blood culture (routine x 2)     Status: None (Preliminary result)   Collection Time: 04/02/2018  3:48 PM  Result Value Ref Range Status   Specimen Description BLOOD LEFT ANTECUBITAL  Final   Special Requests   Final    BOTTLES DRAWN AEROBIC AND ANAEROBIC Blood Culture adequate volume   Culture   Final    NO GROWTH 3 DAYS Performed at [facility], [place] [address] [employer], [place] [address]    Report Status PENDING  Incomplete  Blood culture (routine x 2)     Status: None (Preliminary result)   Collection Time: 04/11/2018  3:48 PM  Result Value Ref Range Status   Specimen Description BLOOD BLOOD RIGHT HAND  Final   Special Requests   Final    BOTTLES DRAWN AEROBIC ONLY Blood Culture results may not be optimal due to an inadequate volume of blood received in culture bottles   Culture   Final    NO GROWTH 3 DAYS Performed at [facility], [place] [address], [place], [place] [address]    Report Status PENDING  Incomplete     Scheduled Meds: . aspirin  325 mg Oral Daily  . feeding supplement (ENSURE ENLIVE)  237 mL Oral BID BM  . folic acid  1 mg Intravenous Daily  . heparin  5,000 Units Subcutaneous Q8H  . lactulose  10 g Oral BID  . multivitamin with minerals  1 tablet Oral Daily  . rifaximin  550 mg Oral BID  . thiamine injection  100 mg Intravenous Daily     LOS: 3 days   Cherene Altes, MD [facility] Office  [phone] Pager - Text Page per Amion  If 7PM-7AM, please contact night-coverage per Amion 04/11/2018, 1:58 PM

## 2018-04-12 LAB — CBC
HCT: 32.6 % — ABNORMAL LOW (ref 36.0–46.0)
HEMOGLOBIN: 10.3 g/dL — AB (ref 12.0–15.0)
MCH: 31.5 pg (ref 26.0–34.0)
MCHC: 31.6 g/dL (ref 30.0–36.0)
MCV: 99.7 fL (ref 80.0–100.0)
Platelets: 99 10*3/uL — ABNORMAL LOW (ref 150–400)
RBC: 3.27 MIL/uL — AB (ref 3.87–5.11)
RDW: 17.2 % — ABNORMAL HIGH (ref 11.5–15.5)
WBC: 27.8 10*3/uL — ABNORMAL HIGH (ref 4.0–10.5)
nRBC: 0.8 % — ABNORMAL HIGH (ref 0.0–0.2)

## 2018-04-12 LAB — URINALYSIS, ROUTINE W REFLEX MICROSCOPIC
BACTERIA UA: NONE SEEN
Bilirubin Urine: NEGATIVE
GLUCOSE, UA: NEGATIVE mg/dL
HGB URINE DIPSTICK: NEGATIVE
Ketones, ur: NEGATIVE mg/dL
NITRITE: NEGATIVE
Protein, ur: 30 mg/dL — AB
Specific Gravity, Urine: 1.018 (ref 1.005–1.030)
pH: 6 (ref 5.0–8.0)

## 2018-04-12 LAB — COMPREHENSIVE METABOLIC PANEL
ALBUMIN: 2.6 g/dL — AB (ref 3.5–5.0)
ALT: 40 U/L (ref 0–44)
ANION GAP: 6 (ref 5–15)
AST: 62 U/L — ABNORMAL HIGH (ref 15–41)
Alkaline Phosphatase: 106 U/L (ref 38–126)
BUN: 31 mg/dL — ABNORMAL HIGH (ref 8–23)
CHLORIDE: 117 mmol/L — AB (ref 98–111)
CO2: 19 mmol/L — AB (ref 22–32)
Calcium: 8.1 mg/dL — ABNORMAL LOW (ref 8.9–10.3)
Creatinine, Ser: 0.78 mg/dL (ref 0.44–1.00)
GFR calc Af Amer: >60 mL/min (ref >60)
GFR calc non Af Amer: >60 mL/min (ref >60)
Glucose, Bld: 105 mg/dL — ABNORMAL HIGH (ref 70–99)
POTASSIUM: 3.4 mmol/L — AB (ref 3.5–5.1)
SODIUM: 142 mmol/L (ref 135–145)
Total Bilirubin: 3.2 mg/dL — ABNORMAL HIGH (ref 0.3–1.2)
Total Protein: 5.1 g/dL — ABNORMAL LOW (ref 6.5–8.1)

## 2018-04-12 LAB — PHOSPHORUS: Phosphorus: 1.2 mg/dL — ABNORMAL LOW (ref 2.5–4.6)

## 2018-04-12 LAB — MAGNESIUM: Magnesium: 2.5 mg/dL — ABNORMAL HIGH (ref 1.7–2.4)

## 2018-04-12 LAB — AMMONIA
AMMONIA: 47 umol/L — AB (ref 9–35)
AMMONIA: 56 umol/L — AB (ref 9–35)

## 2018-04-12 MED ORDER — LACTULOSE ENEMA
300.0000 mL | Freq: Two times a day (BID) | ORAL | Status: DC
  Filled 2018-04-12 (×2): qty 300

## 2018-04-12 MED ORDER — CLONIDINE HCL 0.1 MG PO TABS
0.1000 mg | ORAL_TABLET | Freq: Three times a day (TID) | ORAL | Status: DC
  Administered 2018-04-12 – 2018-04-13 (×4): 0.1 mg via ORAL
  Filled 2018-04-12 (×4): qty 1

## 2018-04-12 MED ORDER — FOLIC ACID 5 MG/ML IJ SOLN
1.0000 mg | Freq: Every day | INTRAMUSCULAR | Status: DC
  Administered 2018-04-12 – 2018-04-14 (×3): 1 mg via INTRAVENOUS
  Filled 2018-04-12 (×4): qty 0.2

## 2018-04-12 MED ORDER — LACTULOSE ENEMA
300.0000 mL | Freq: Two times a day (BID) | ORAL | Status: DC
  Administered 2018-04-13: 300 mL via RECTAL
  Filled 2018-04-12 (×2): qty 300

## 2018-04-12 MED ORDER — POTASSIUM PHOSPHATES 15 MMOLE/5ML IV SOLN
20.0000 mmol | Freq: Once | INTRAVENOUS | Status: AC
  Administered 2018-04-12: 20 mmol via INTRAVENOUS
  Filled 2018-04-12: qty 6.67

## 2018-04-12 MED ORDER — THIAMINE HCL 100 MG/ML IJ SOLN
100.0000 mg | Freq: Every day | INTRAMUSCULAR | Status: DC
  Administered 2018-04-12 – 2018-04-14 (×3): 100 mg via INTRAVENOUS
  Filled 2018-04-12 (×3): qty 2

## 2018-04-12 MED ORDER — M.V.I. ADULT IV INJ
Freq: Once | INTRAVENOUS | Status: AC
  Administered 2018-04-12: 15:00:00 via INTRAVENOUS
  Filled 2018-04-12: qty 10

## 2018-04-12 NOTE — Progress Notes (Addendum)
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    Work-up in the ED revealed an abnormal EKG with lateral T wave inversions and lab work showing elevated troponin, leukocytosis, elevated liver enzymes, ammonia level, CK and lactate. CT abdomen showed gallstones as well as a 3 mm CBD stone.     Subjective: The patient has been sedate throughout the day.  Earlier this morning she was agitated and received Ativan on the CIWA protocol.  At the time of my exam this afternoon she will not open her eyes or interact with me.  She is in no respiratory distress and there is no evidence of uncontrolled pain.  She appears clinically stable simply obtunded.  Orders to place a Flexi-Seal and begin lactulose enemas were held this morning as the patient was having profuse watery diarrhea at the time.  This afternoon her diarrhea has resolved.  I will recheck an ammonia level now and if it remains elevated we will proceed with Flexi-Seal and lactulose enemas as initially ordered by GI.  I received notification that the patient's brother was requesting information regarding her clinical status.  At this time however I cannot confirm that the patient would give me permission to provide protective medical information to her sibling and therefore I have not returned his call.  Assessment & Plan:  Acute metabolic encephalopathy hepatic encephalopathy versus sepsis versus CVA - CT head negative for acute findings - Ammonia level elevated but not markedly so - suspect sedation today is related to ativan dosing on CIWA protocol - stop benzos and follow clinically - recheck ammonia  this evening, and if further elevated will commence lactulose enemas as ordered by GI early today    Sepsis v/s SIRS  abdom benign on exam but pt is obtunded - no evidence of an acute abdom infectious process on CT - WBC is climbing - no clear source of infection - hold abx for now, but if pt develops fever may need to resume broad empiric coverage - repeat UA and obtain f/u CXR in AM   Troponemia Cardiology felt this is c/w demand ischemia - TTE w/ normal EF and no WMA - pt is not a candidate for further cardiac w/u   Alcoholic liver disease with varices/ascites Possibility of acute acohol associated hepatitis is being investigated by GI   Moderate malnutrition in context of chronic illness / Underweight Nutrition following - diet not able to be advanced today due to mental status   Hypophosphatemia Replace and follow - due to poor intake / malnutrition of alcoholism  DVT prophylaxis: SQ heparin  Code Status: FULL CODE Family Communication: no family present at time of exam - information not shared w/ family due to lack of consent from patient  Disposition Plan: SDU  Consultants:  GI Cardiology   Antimicrobials:  Cefepime  11/22 > 11/24  Objective: Blood pressure (!) 140/55, pulse 89, temperature 98.3 F (36.8 C), temperature source Axillary, resp. rate (!) 26, height 5\' 5"  (1.651 m), weight 52.4 kg, SpO2 96 %.  Intake/Output Summary (Last 24 hours) at 04/12/2018 1612 Last data filed at 04/12/2018 1214 Gross per 24 hour  Intake 1190 ml  Output 1300 ml  Net -110 ml   Filed Weights   04/09/2018 2013 04/10/18 0541 04/11/18 0355  Weight: 47.9 kg 50.6 kg 52.4 kg    Examination: General: No acute respiratory distress - obtunded  Lungs: Clear to auscultation bilaterally - no wheezing or crackles  Cardiovascular: RRR - no M or rub  Abdomen: ND, soft, bs+, no mass  Extremities: No signif edema bilateral lower extremities  CBC: Recent Labs  Lab 03/24/2018 1358   04/10/18 0250 04/11/18 0721 04/12/18 0412  WBC 19.0*   < > 19.1* 22.5* 27.8*  NEUTROABS 15.0*  --   --   --   --   HGB 14.6   < > 12.5 11.0* 10.3*  HCT 46.3*   < > 39.0 33.6* 32.6*  MCV 98.7   < > 97.5 99.1 99.7  PLT PLATELET CLUMPS NOTED ON SMEAR, UNABLE TO ESTIMATE   < > 112* 94* 99*   < > = values in this interval not displayed.   Basic Metabolic Panel: Recent Labs  Lab 04/09/18 1120 04/10/18 0250 04/11/18 0323 04/12/18 0412  NA  --  141 142 142  K  --  3.3* 3.3* 3.4*  CL  --  114* 118* 117*  CO2  --  20* 18* 19*  GLUCOSE  --  119* 101* 105*  BUN  --  32* 34* 31*  CREATININE  --  0.74 0.75 0.78  CALCIUM  --  7.7* 7.5* 8.1*  MG 1.5*  --   --  2.5*  PHOS  --   --   --  1.2*   GFR: Estimated Creatinine Clearance: 54.1 mL/min (by C-G formula based on SCr of 0.78 mg/dL).  Liver Function Tests: Recent Labs  Lab 04/09/18 0234 04/10/18 0250 04/11/18 0323 04/12/18 0412  AST 95* 70* 58* 62*  ALT 38 37 34 40  ALKPHOS 118 100 89 106  BILITOT 4.4* 4.2* 3.3* 3.2*  PROT 6.4* 5.6* 4.8* 5.1*  ALBUMIN 3.2* 2.7* 2.5* 2.6*   Recent Labs  Lab 03/25/2018 1358  LIPASE 29   Recent Labs  Lab 04/03/2018 1358 04/09/18 0810 04/10/18 0250 04/11/18 0323 04/12/18 0412  AMMONIA 53* 80* 66* 49* 47*    Coagulation Profile: Recent Labs  Lab 04/01/2018 1548  INR 1.48    Cardiac Enzymes: Recent Labs  Lab 03/21/2018 1358 04/09/18 0810 04/09/18 1120 04/10/18 0250 04/11/18 0323  CKTOTAL 1,223* 460*  --  254* 147  TROPONINI  --   --  1.50*  --   --    CBG: Recent Labs  Lab 04/13/2018 1320  GLUCAP 128*    Recent Results (from the past 240 hour(s))  Blood culture (routine x 2)     Status: None (Preliminary result)   Collection Time: 04/11/2018  3:48 PM  Result Value Ref Range Status   Specimen Description BLOOD LEFT ANTECUBITAL  Final   Special Requests   Final    BOTTLES DRAWN AEROBIC AND ANAEROBIC Blood Culture adequate volume   Culture   Final    NO GROWTH 4  DAYS Performed at [facility], [place] [address] [place],  [address]    Report Status PENDING  Incomplete  Blood culture (routine x 2)     Status: None (Preliminary result)   Collection Time: 03/18/2018  3:48 PM  Result Value Ref Range Status   Specimen Description BLOOD BLOOD RIGHT HAND  Final   Special Requests   Final    BOTTLES DRAWN AEROBIC ONLY Blood  Culture results may not be optimal due to an inadequate volume of blood received in culture bottles   Culture   Final    NO GROWTH 4 DAYS Performed at [facility], [place] [address] [place], [place] [address]    Report Status PENDING  Incomplete     Scheduled Meds: . cloNIDine  0.1 mg Oral TID  . dextroamphetamine  10 mg Oral QID  . feeding supplement (ENSURE ENLIVE)  237 mL Oral BID BM  . folic acid  1 mg Intravenous Daily  . heparin  5,000 Units Subcutaneous Q8H  . lactulose  10 g Oral BID  . rifaximin  550 mg Oral BID  . thiamine injection  100 mg Intravenous Daily     LOS: 4 days   Cherene Altes, MD [facility] Office  [phone] Pager - Text Page per Amion  If 7PM-7AM, please contact night-coverage per Amion 04/12/2018, 4:12 PM

## 2018-04-12 NOTE — Progress Notes (Signed)
Subjective: The patient was seen and examined at bedside. She is extremely lethargic as she has received IV Ativan recently for agitation and confusion related to  alcohol withdrawal. She seems to have had several loose bowel movements following use of oral lactulose.  Objective: Vital signs in last 24 hours: Temp:  [97.5 F (36.4 C)-98.8 F (37.1 C)] 98.6 F (37 C) (11/26 0415) Pulse Rate:  [89-96] 96 (11/26 0415) Resp:  [18-24] 24 (11/26 0415) BP: (119-130)/(49-96) 119/84 (11/26 0415) SpO2:  [93 %-95 %] 95 % (11/26 0415) Weight change:  Last BM Date: 04/11/18  [id number], does not follow commands GENERAL:icteric,bruise noted behind right ear and on right foot ABDOMEN:soft, normoactive bowel sounds EXTREMITIES:no edema  Lab Results: Results for orders placed or performed during the hospital encounter of 04/10/2018 (from the past 48 hour(s))  Comprehensive metabolic panel     Status: Abnormal   Collection Time: 04/11/18  3:23 AM  Result Value Ref Range   Sodium 142 135 - 145 mmol/L   Potassium 3.3 (L) 3.5 - 5.1 mmol/L   Chloride 118 (H) 98 - 111 mmol/L   CO2 18 (L) 22 - 32 mmol/L   Glucose, Bld 101 (H) 70 - 99 mg/dL   BUN 34 (H) 8 - 23 mg/dL   Creatinine, Ser 0.75 0.44 - 1.00 mg/dL   Calcium 7.5 (L) 8.9 - 10.3 mg/dL   Total Protein 4.8 (L) 6.5 - 8.1 g/dL   Albumin 2.5 (L) 3.5 - 5.0 g/dL   AST 58 (H) 15 - 41 U/L   ALT 34 0 - 44 U/L   Alkaline Phosphatase 89 38 - 126 U/L   Total Bilirubin 3.3 (H) 0.3 - 1.2 mg/dL   GFR calc non Af Amer >60 >60 mL/min   GFR calc Af Amer >60 >60 mL/min    Comment: (NOTE) The eGFR has been calculated using the CKD EPI equation. This calculation has not been validated in all clinical situations. eGFR's persistently <60 mL/min signify possible Chronic Kidney Disease.    Anion gap 6 5 - 15    Comment: Performed at [facility] [address] [place], [place] [address]  Ammonia     Status: Abnormal   Collection Time: 04/11/18  3:23  AM  Result Value Ref Range   Ammonia 49 (H) 9 - 35 umol/L    Comment: Performed at [facility], [place] [address], [place], [place] [address]  CK     Status: None   Collection Time: 04/11/18  3:23 AM  Result Value Ref Range   Total CK 147 38 - 234 U/L    Comment: Performed at [facility], [place] [address] [place], [place] [address]  CBC     Status: Abnormal   Collection Time: 04/11/18  7:21 AM  Result Value Ref Range   WBC 22.5 (H) 4.0 - 10.5 K/uL   RBC 3.39 (L) 3.87 - 5.11 MIL/uL   Hemoglobin 11.0 (L) 12.0 - 15.0 g/dL   HCT 33.6 (L) 36.0 - 46.0 %   MCV 99.1 80.0 - 100.0 fL   MCH 32.4 26.0 - 34.0 pg   MCHC 32.7 30.0 - 36.0 g/dL   RDW 16.7 (H) 11.5 - 15.5 %   Platelets 94 (L) 150 - 400 K/uL    Comment: REPEATED TO VERIFY Immature Platelet Fraction may be clinically indicated, consider ordering this additional test [id number] CONSISTENT WITH PREVIOUS RESULT    nRBC 0.4 (H) 0.0 - 0.2 %  Comment: Performed at [facility], [place] [address] [place], [place] [address]  Comprehensive metabolic panel     Status: Abnormal   Collection Time: 04/12/18  4:12 AM  Result Value Ref Range   Sodium 142 135 - 145 mmol/L   Potassium 3.4 (L) 3.5 - 5.1 mmol/L   Chloride 117 (H) 98 - 111 mmol/L   CO2 19 (L) 22 - 32 mmol/L   Glucose, Bld 105 (H) 70 - 99 mg/dL   BUN 31 (H) 8 - 23 mg/dL   Creatinine, Ser 0.78 0.44 - 1.00 mg/dL   Calcium 8.1 (L) 8.9 - 10.3 mg/dL   Total Protein 5.1 (L) 6.5 - 8.1 g/dL   Albumin 2.6 (L) 3.5 - 5.0 g/dL   AST 62 (H) 15 - 41 U/L   ALT 40 0 - 44 U/L   Alkaline Phosphatase 106 38 - 126 U/L   Total Bilirubin 3.2 (H) 0.3 - 1.2 mg/dL   GFR calc non Af Amer >60 >60 mL/min   GFR calc Af Amer >60 >60 mL/min    Comment: (NOTE) The eGFR has been calculated using the CKD EPI equation. This calculation has not been validated in all clinical situations. eGFR's persistently <60 mL/min signify possible Chronic Kidney Disease.    Anion gap 6 5 - 15     Comment: Performed at [place] [address], [place], [place] [address]  CBC     Status: Abnormal   Collection Time: 04/12/18  4:12 AM  Result Value Ref Range   WBC 27.8 (H) 4.0 - 10.5 K/uL   RBC 3.27 (L) 3.87 - 5.11 MIL/uL   Hemoglobin 10.3 (L) 12.0 - 15.0 g/dL   HCT 32.6 (L) 36.0 - 46.0 %   MCV 99.7 80.0 - 100.0 fL   MCH 31.5 26.0 - 34.0 pg   MCHC 31.6 30.0 - 36.0 g/dL   RDW 17.2 (H) 11.5 - 15.5 %   Platelets 99 (L) 150 - 400 K/uL    Comment: REPEATED TO VERIFY Immature Platelet Fraction may be clinically indicated, consider ordering this additional test [id number] CONSISTENT WITH PREVIOUS RESULT    nRBC 0.8 (H) 0.0 - 0.2 %    Comment: Performed at [facility], [place] [address], [place], [place] [address]  Ammonia     Status: Abnormal   Collection Time: 04/12/18  4:12 AM  Result Value Ref Range   Ammonia 47 (H) 9 - 35 umol/L    Comment: Performed at [facility], [place] [address] [place], [place] [address]  Magnesium     Status: Abnormal   Collection Time: 04/12/18  4:12 AM  Result Value Ref Range   Magnesium 2.5 (H) 1.7 - 2.4 mg/dL    Comment: Performed at [facility] [address], [place], [place] [address]  Phosphorus     Status: Abnormal   Collection Time: 04/12/18  4:12 AM  Result Value Ref Range   Phosphorus 1.2 (L) 2.5 - 4.6 mg/dL    Comment: Performed at [facility] [address], [place], [place] [address]    Studies/Results: No results found.  Medications: I have reviewed the patient's current medications.  Assessment: Encephalopathy related to alcohol use CT head unremarkable on 03/31/2018 Changes of cirrhosis noted on CAT , with small paraesophageal and periportal varices 3 mm calculus in distal common bile duct along with numerous gallstones noted in distended gallbladder T bili 3.2 today, AST 62/AST 14 with  ALP 106, normal renal function, mild anemia with marked leukocytosis( possibly from underlying alcoholic   Hepatitis, will obtain PT/INR to calculate  Hepatic discriminant function)  Plan: I doubt that patient will be awake enough to take lactulose or Xifaxan for treatment of hepatic encephalopathy She will benefit from lactulose enemas, and placement of a flexiseal rectal tube. Meanwhile agree with continuation of MVI, thiamine and folic acid-which will have to be given intravenous instead of oral. Guarded prognosis   Ronnette Juniper 04/12/2018, 10:43 AM   Pager [phone] If no answer or after 5 PM call [phone]

## 2018-04-12 NOTE — Progress Notes (Signed)
CSW contacted the patient's care giver, Jana Half as requsted. She  inquired about the process if needed for placement in SNF. CSW explained the process and answered questions. She expressed appreciation for the CSW assistance.    Thurmond Butts, [facility] Social Worker [phone]

## 2018-04-13 ENCOUNTER — Inpatient Hospital Stay (HOSPITAL_COMMUNITY): Payer: Medicare HMO

## 2018-04-13 DIAGNOSIS — K7200 Acute and subacute hepatic failure without coma: Secondary | ICD-10-CM

## 2018-04-13 DIAGNOSIS — K709 Alcoholic liver disease, unspecified: Secondary | ICD-10-CM

## 2018-04-13 DIAGNOSIS — G9341 Metabolic encephalopathy: Secondary | ICD-10-CM | POA: Diagnosis present

## 2018-04-13 DIAGNOSIS — K7682 Hepatic encephalopathy: Secondary | ICD-10-CM | POA: Diagnosis present

## 2018-04-13 LAB — CBC
HCT: 31.4 % — ABNORMAL LOW (ref 36.0–46.0)
Hemoglobin: 10 g/dL — ABNORMAL LOW (ref 12.0–15.0)
MCH: 32.6 pg (ref 26.0–34.0)
MCHC: 31.8 g/dL (ref 30.0–36.0)
MCV: 102.3 fL — AB (ref 80.0–100.0)
NRBC: 0.2 % (ref 0.0–0.2)
PLATELETS: 83 10*3/uL — AB (ref 150–400)
RBC: 3.07 MIL/uL — AB (ref 3.87–5.11)
RDW: 18.6 % — ABNORMAL HIGH (ref 11.5–15.5)
WBC: 22.8 10*3/uL — ABNORMAL HIGH (ref 4.0–10.5)

## 2018-04-13 LAB — CULTURE, BLOOD (ROUTINE X 2)
Culture: NO GROWTH
Culture: NO GROWTH
Special Requests: ADEQUATE

## 2018-04-13 LAB — COMPREHENSIVE METABOLIC PANEL
ALK PHOS: 106 U/L (ref 38–126)
ALT: 39 U/L (ref 0–44)
ANION GAP: 3 — AB (ref 5–15)
AST: 58 U/L — ABNORMAL HIGH (ref 15–41)
Albumin: 2.4 g/dL — ABNORMAL LOW (ref 3.5–5.0)
BUN: 24 mg/dL — ABNORMAL HIGH (ref 8–23)
CO2: 20 mmol/L — AB (ref 22–32)
Calcium: 7.8 mg/dL — ABNORMAL LOW (ref 8.9–10.3)
Chloride: 122 mmol/L — ABNORMAL HIGH (ref 98–111)
Creatinine, Ser: 0.69 mg/dL (ref 0.44–1.00)
GFR calc non Af Amer: >60 mL/min (ref >60)
Glucose, Bld: 108 mg/dL — ABNORMAL HIGH (ref 70–99)
Potassium: 3.5 mmol/L (ref 3.5–5.1)
SODIUM: 145 mmol/L (ref 135–145)
TOTAL PROTEIN: 4.7 g/dL — AB (ref 6.5–8.1)
Total Bilirubin: 2.5 mg/dL — ABNORMAL HIGH (ref 0.3–1.2)

## 2018-04-13 LAB — PROTIME-INR
INR: 1.65
Prothrombin Time: 19.3 seconds — ABNORMAL HIGH (ref 11.4–15.2)

## 2018-04-13 LAB — AMMONIA: AMMONIA: 23 umol/L (ref 9–35)

## 2018-04-13 LAB — MAGNESIUM: MAGNESIUM: 2.2 mg/dL (ref 1.7–2.4)

## 2018-04-13 LAB — PHOSPHORUS: PHOSPHORUS: 2.6 mg/dL (ref 2.5–4.6)

## 2018-04-13 MED ORDER — POTASSIUM CHLORIDE CRYS ER 20 MEQ PO TBCR
40.0000 meq | EXTENDED_RELEASE_TABLET | Freq: Once | ORAL | Status: AC
  Administered 2018-04-13: 40 meq via ORAL
  Filled 2018-04-13: qty 2

## 2018-04-13 MED ORDER — LACTULOSE 10 GM/15ML PO SOLN: 10.0000 g | Freq: Three times a day (TID) | ORAL | Status: DC

## 2018-04-13 MED ORDER — PENTOXIFYLLINE ER 400 MG PO TBCR
400.0000 mg | EXTENDED_RELEASE_TABLET | Freq: Three times a day (TID) | ORAL | Status: DC
  Administered 2018-04-14: 400 mg via ORAL
  Filled 2018-04-13 (×6): qty 1

## 2018-04-13 MED ORDER — CHLORHEXIDINE GLUCONATE 0.12 % MT SOLN
15.0000 mL | Freq: Two times a day (BID) | OROMUCOSAL | Status: DC
  Administered 2018-04-13 – 2018-04-14 (×2): 15 mL via OROMUCOSAL
  Filled 2018-04-13 (×3): qty 15

## 2018-04-13 MED ORDER — LACTULOSE 10 GM/15ML PO SOLN
30.0000 g | Freq: Three times a day (TID) | ORAL | Status: DC
  Administered 2018-04-14: 30 g via ORAL
  Filled 2018-04-13 (×3): qty 45

## 2018-04-13 MED ORDER — KETOROLAC TROMETHAMINE 30 MG/ML IJ SOLN
30.0000 mg | Freq: Four times a day (QID) | INTRAMUSCULAR | Status: DC | PRN
  Administered 2018-04-13 (×2): 30 mg via INTRAVENOUS
  Filled 2018-04-13 (×2): qty 1

## 2018-04-13 MED ORDER — ORAL CARE MOUTH RINSE
15.0000 mL | Freq: Two times a day (BID) | OROMUCOSAL | Status: DC
  Administered 2018-04-14 (×2): 15 mL via OROMUCOSAL

## 2018-04-13 NOTE — Progress Notes (Signed)
Subjective: The patient was seen and examined at bedside. She pulled out her rectal tube and appears restless. She has been trying to remove her mittens and is agitated and yelling at times.  Objective: Vital signs in last 24 hours: Temp:  [97.3 F (36.3 C)-98.8 F (37.1 C)] 97.3 F (36.3 C) (11/27 0414) Pulse Rate:  [75-89] 89 (11/27 0710) Resp:  [18-32] 32 (11/27 0710) BP: (107-163)/(39-77) 141/67 (11/27 0951) SpO2:  [93 %-100 %] 95 % (11/27 0710) Weight:  [54.6 kg] 54.6 kg (11/27 0600) Weight change:  Last BM Date: 04/13/18  EY:CXKGYJEH, not cooperative,wheezing intermittently GENERAL:mild pallor, no icterus ABDOMEN:soft, normoactive bowel sounds EXTREMITIES:no edema  Lab Results: Results for orders placed or performed during the hospital encounter of 03/26/2018 (from the past 48 hour(s))  Comprehensive metabolic panel     Status: Abnormal   Collection Time: 04/12/18  4:12 AM  Result Value Ref Range   Sodium 142 135 - 145 mmol/L   Potassium 3.4 (L) 3.5 - 5.1 mmol/L   Chloride 117 (H) 98 - 111 mmol/L   CO2 19 (L) 22 - 32 mmol/L   Glucose, Bld 105 (H) 70 - 99 mg/dL   BUN 31 (H) 8 - 23 mg/dL   Creatinine, Ser 0.78 0.44 - 1.00 mg/dL   Calcium 8.1 (L) 8.9 - 10.3 mg/dL   Total Protein 5.1 (L) 6.5 - 8.1 g/dL   Albumin 2.6 (L) 3.5 - 5.0 g/dL   AST 62 (H) 15 - 41 U/L   ALT 40 0 - 44 U/L   Alkaline Phosphatase 106 38 - 126 U/L   Total Bilirubin 3.2 (H) 0.3 - 1.2 mg/dL   GFR calc non Af Amer >60 >60 mL/min   GFR calc Af Amer >60 >60 mL/min    Comment: (NOTE) The eGFR has been calculated using the CKD EPI equation. This calculation has not been validated in all clinical situations. eGFR's persistently <60 mL/min signify possible Chronic Kidney Disease.    Anion gap 6 5 - 15    Comment: Performed at [facility] [address] [place], [place] [address]  CBC     Status: Abnormal   Collection Time: 04/12/18  4:12 AM  Result Value Ref Range   WBC 27.8 (H) 4.0 - 10.5  K/uL   RBC 3.27 (L) 3.87 - 5.11 MIL/uL   Hemoglobin 10.3 (L) 12.0 - 15.0 g/dL   HCT 32.6 (L) 36.0 - 46.0 %   MCV 99.7 80.0 - 100.0 fL   MCH 31.5 26.0 - 34.0 pg   MCHC 31.6 30.0 - 36.0 g/dL   RDW 17.2 (H) 11.5 - 15.5 %   Platelets 99 (L) 150 - 400 K/uL    Comment: REPEATED TO VERIFY Immature Platelet Fraction may be clinically indicated, consider ordering this additional test [id number] CONSISTENT WITH PREVIOUS RESULT    nRBC 0.8 (H) 0.0 - 0.2 %    Comment: Performed at [facility], [place] [address] [place], [place] [address]  Ammonia     Status: Abnormal   Collection Time: 04/12/18  4:12 AM  Result Value Ref Range   Ammonia 47 (H) 9 - 35 umol/L    Comment: Performed at [facility], [place] [address] [place], [place] [address]  Magnesium     Status: Abnormal   Collection Time: 04/12/18  4:12 AM  Result Value Ref Range   Magnesium 2.5 (H) 1.7 - 2.4 mg/dL    Comment: Performed at [facility],  [address], [place], [place] [address]  Phosphorus     Status: Abnormal   Collection Time: 04/12/18  4:12 AM  Result Value Ref Range   Phosphorus 1.2 (L) 2.5 - 4.6 mg/dL    Comment: Performed at [facility] [address], [place], [place] [address]  Urinalysis, Routine w reflex microscopic     Status: Abnormal   Collection Time: 04/12/18  9:38 PM  Result Value Ref Range   Color, Urine AMBER (A) YELLOW    Comment: BIOCHEMICALS MAY BE AFFECTED BY COLOR   APPearance CLEAR CLEAR   Specific Gravity, Urine 1.018 1.005 - 1.030   pH 6.0 5.0 - 8.0   Glucose, UA NEGATIVE NEGATIVE mg/dL   Hgb urine dipstick NEGATIVE NEGATIVE   Bilirubin Urine NEGATIVE NEGATIVE   Ketones, ur NEGATIVE NEGATIVE mg/dL   Protein, ur 30 (A) NEGATIVE mg/dL   Nitrite NEGATIVE NEGATIVE   Leukocytes, UA TRACE (A) NEGATIVE   RBC / HPF 0-5 0 - 5 RBC/hpf   WBC, UA 0-5 0 - 5 WBC/hpf   Bacteria, UA NONE SEEN NONE SEEN   Squamous Epithelial / LPF 0-5 0 - 5   Mucus PRESENT     Comment:  Performed at [facility], [place] [address] [place], [place] [address]  Ammonia     Status: Abnormal   Collection Time: 04/12/18 10:02 PM  Result Value Ref Range   Ammonia 56 (H) 9 - 35 umol/L    Comment: Performed at [facility], [place] [address], [place], [place] [address]  Ammonia     Status: None   Collection Time: 04/13/18  3:45 AM  Result Value Ref Range   Ammonia 23 9 - 35 umol/L    Comment: Performed at [facility], [place] [address], [place] , [place] [address]  Phosphorus     Status: None   Collection Time: 04/13/18  3:45 AM  Result Value Ref Range   Phosphorus 2.6 2.5 - 4.6 mg/dL    Comment: Performed at [facility] [address], [place], [place] [address]  Protime-INR     Status: Abnormal   Collection Time: 04/13/18  3:45 AM  Result Value Ref Range   Prothrombin Time 19.3 (H) 11.4 - 15.2 seconds   INR 1.65     Comment: Performed at [facility] [address], [place],  [address]  Comprehensive metabolic panel     Status: Abnormal   Collection Time: 04/13/18  3:45 AM  Result Value Ref Range   Sodium 145 135 - 145 mmol/L   Potassium 3.5 3.5 - 5.1 mmol/L   Chloride 122 (H) 98 - 111 mmol/L   CO2 20 (L) 22 - 32 mmol/L   Glucose, Bld 108 (H) 70 - 99 mg/dL   BUN 24 (H) 8 - 23 mg/dL   Creatinine, Ser 0.69 0.44 - 1.00 mg/dL   Calcium 7.8 (L) 8.9 - 10.3 mg/dL   Total Protein 4.7 (L) 6.5 - 8.1 g/dL   Albumin 2.4 (L) 3.5 - 5.0 g/dL   AST 58 (H) 15 - 41 U/L   ALT 39 0 - 44 U/L   Alkaline Phosphatase 106 38 - 126 U/L   Total Bilirubin 2.5 (H) 0.3 - 1.2 mg/dL   GFR calc non Af Amer >60 >60 mL/min   GFR calc Af Amer >60 >60 mL/min   Anion gap 3 (L) 5 - 15    Comment: Performed at [facility], [place] [address], [place], [place] [address]  CBC     Status: Abnormal   Collection Time: 04/13/18  3:45 AM  Result Value Ref Range   WBC 22.8 (H) 4.0 - 10.5 K/uL   RBC 3.07 (L) 3.87 - 5.11 MIL/uL   Hemoglobin 10.0 (L) 12.0 - 15.0 g/dL   HCT  31.4 (L) 36.0 - 46.0 %   MCV 102.3 (H) 80.0 - 100.0 fL   MCH 32.6 26.0 - 34.0 pg   MCHC 31.8 30.0 - 36.0 g/dL   RDW 18.6 (H) 11.5 - 15.5 %   Platelets 83 (L) 150 - 400 K/uL    Comment: REPEATED TO VERIFY Immature Platelet Fraction may be clinically indicated, consider ordering this additional test [id number] CONSISTENT WITH PREVIOUS RESULT    nRBC 0.2 0.0 - 0.2 %    Comment: Performed at [facility], [place] [address], [place], [place] [address]  Magnesium     Status: None   Collection Time: 04/13/18  3:46 AM  Result Value Ref Range   Magnesium 2.2 1.7 - 2.4 mg/dL    Comment: Performed at [facility] [address], [place], [place] [address]    Studies/Results: Dg Chest Port 1 View  Result Date: 04/13/2018 CLINICAL DATA:  Difficulty breathing EXAM: PORTABLE CHEST 1 VIEW COMPARISON:  03/29/2018 FINDINGS: Cardiac shadow is mildly enlarged but stable. Aortic calcifications are seen. The lungs are well aerated bilaterally with very minimal platelike atelectasis in the left base. No acute infiltrate or effusion is noted. No bony abnormality is noted. IMPRESSION: Minimal platelike atelectasis in the left base. Electronically Signed   By: Inez Catalina M.D.   On: 04/13/2018 07:59    Medications: I have reviewed the patient's current medications.  Assessment: Encephalopathy related to cirrhosis and continued alcohol use,ammonia level normal today Persistent leukocytosis, anemia,thrombocytopenia, INR 1.65,mild acidosis, elevated BUN Hepatitis discriminant function 36 Cirrhosis, paraesophageal and periportal varices  Plan: Will not be able to tolerate lactulose enemas, will encourage oral lactulose and Xifaxan Since hepatic discriminant function is more than 32 with persistent leukocytosis suspicious for acute alcoholic hepatitis, will start patient on pentoxifylline  Continue IV folic acid, IV thiamine and protocol for alcohol withdrawal with Ativan as needed   Ronnette Juniper 04/13/2018, 10:13 AM   Pager [phone] If no answer or after 5 PM call [phone]

## 2018-04-13 NOTE — Progress Notes (Signed)
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   Work-up in the ED revealed an abnormal EKG with lateral T wave inversions and lab work showing elevated troponin, leukocytosis, elevated liver enzymes, ammonia level, CK and lactate. CT abdomen showed gallstones as well as a 3 mm CBD stone.    Assessment & Plan:   Principal Problem:   Acute metabolic encephalopathy Active Problems:   Acute hepatic encephalopathy   Alcoholic liver disease (HCC)   Elevated troponin   Sepsis ([facility])   AMS (altered mental status)   Malnutrition of moderate degree  #1 acute metabolic encephalopathy/hepatic encephalopathy Patient presented with altered mental status and confusion.  Patient noted to have an elevated ammonia level.  Initial concern for sepsis CT head negative for any acute abnormality patient noted to be sedated on 04/12/2018 after receiving Ativan which was subsequently discontinued.  Patient started on lactulose enemas.  Ammonia levels trended down.  Patient regularly in bed and screaming in pain.  However when patient is pointing down patient is alert and oriented to place and self.  Rectal tube was pulled out by patient per RN.  Discontinue lactulose enemas as patient now alert and placed on lactulose 30 g p.o. 3 times daily.  Continue Xifaxan.  GI following appreciate input and recommendations.  2.  Abdominal pain Patient screaming in pain and writhling in bed.  Complaining of lower abdominal pain.  Concerned that patient may have pulled on Foley catheter.  Will discontinue Foley catheter.  Patient with recent UA done on 04/12/2018 which was negative for any acute  infection.  Patient currently afebrile.  Toradol as needed.  3.  Elevated troponin Likely secondary to demand ischemia.  Patient seen in consultation by cardiology.  2D echo with normal EF with no wall motion abnormalities.  Per cardiology patient not a candidate for any further cardiac work-up at this time.  Follow.  4.  Alcoholic liver disease with varices/ascites Likely acute alcohol associated hepatitis being investigated by GI.  Continue lactulose and Xifaxan.  Per GI.  5.  Hypophosphatemia Due to poor oral intake.  Repleted.  Repeat phosphorus levels in the morning.  6.??  Sepsis versus SIRS Patient with complaints of lower abdominal pain.  No abdominal pain felt likely secondary to indwelling Foley catheter.  Urinalysis done on 04/12/2018 was unremarkable.  Repeat UA with cultures and sensitivities.  Discontinue Foley catheter.  Patient currently afebrile.  Chest x-ray negative for any acute infiltrates.  Blood cultures with no growth to date x5 days.  WBC was trending upwards on 04/12/2018 and currently trending back downwards.  Continue to hold on any antibiotics at this time.  Follow.  7.  Moderate malnutrition in the context of chronic disease/underweight Nutrition following.  Currently on a full liquid diet.   DVT prophylaxis: Heparin Code Status: Full Family Communication: Updated patient.  No family at bedside. Disposition Plan: To be determined.   Consultants:   GI: Dr.Buccini 04/09/2018  Cardiology: Dr. Selena Batten 03/28/2018  Procedures:   CT abdomen and pelvis 03/27/2018  CT head 03/22/2018  Chest x-ray 04/11/2018, 04/13/2018  2D echo 04/09/2018  Antimicrobials:   None   Subjective: Patient writhling in bed.  Patient screaming.  Patient with complaints of lower abdominal pain.  Per RN patient may have pulled on the Foley catheter.  Objective: Vitals:   04/13/18 0414 04/13/18 0600 04/13/18 0710 04/13/18 0951  BP: (!) 163/68  (!) 152/75 (!) 141/67  Pulse:  81  89   Resp: (!) 29 (!) 27 (!) 32   Temp: (!) 97.3 F (36.3 C)     TempSrc: Axillary     SpO2: 95%  95%   Weight:  54.6 kg    Height:        Intake/Output Summary (Last 24 hours) at 04/13/2018 1009 Last data filed at 04/13/2018 0938 Gross per 24 hour  Intake 4460.11 ml  Output 2100 ml  Net 2360.11 ml   Filed Weights   04/10/18 0541 04/11/18 0355 04/13/18 0600  Weight: 50.6 kg 52.4 kg 54.6 kg    Examination:  General exam: Screaming.  Uncomfortable. Respiratory system: Some coarse breath sounds anterior lung fields.  No wheezing.  No crackles.  Respiratory effort normal. Cardiovascular system: S1 & S2 heard, RRR. No JVD, murmurs, rubs, gallops or clicks. No pedal edema. Gastrointestinal system: Abdomen is nondistended, soft and some tenderness to palpation in the suprapubic region.  No organomegaly or masses felt. Normal bowel sounds heard. Central nervous system: Alert and oriented to self and place.. No focal neurological deficits. Extremities: Symmetric 5 x 5 power. Skin: No rashes, lesions or ulcers Psychiatry: Judgement and insight appear fair. Mood & affect appropriate.     Data Reviewed: I have personally reviewed following labs and imaging studies  CBC: Recent Labs  Lab 03/27/2018 1358 04/09/18 0234 04/10/18 0250 04/11/18 0721 04/12/18 0412 04/13/18 0345  WBC 19.0* 19.2* 19.1* 22.5* 27.8* 22.8*  NEUTROABS 15.0*  --   --   --   --   --   HGB 14.6 13.8 12.5 11.0* 10.3* 10.0*  HCT 46.3* 41.5 39.0 33.6* 32.6* 31.4*  MCV 98.7 95.6 97.5 99.1 99.7 102.3*  PLT PLATELET CLUMPS NOTED ON SMEAR, UNABLE TO ESTIMATE PLATELET CLUMPS NOTED ON SMEAR, UNABLE TO ESTIMATE 112* 94* 99* 83*   Basic Metabolic Panel: Recent Labs  Lab 04/09/18 0234 04/09/18 1120 04/10/18 0250 04/11/18 0323 04/12/18 0412 04/13/18 0345 04/13/18 0346  NA 136  --  141 142 142 145  --   K 3.4*  --  3.3* 3.3* 3.4* 3.5  --   CL 106  --  114* 118* 117* 122*  --   CO2 20*  --  20* 18* 19* 20*   --   GLUCOSE 106*  --  119* 101* 105* 108*  --   BUN 18  --  32* 34* 31* 24*  --   CREATININE 0.74  --  0.74 0.75 0.78 0.69  --   CALCIUM 8.3*  --  7.7* 7.5* 8.1* 7.8*  --   MG  --  1.5*  --   --  2.5*  --  2.2  PHOS  --   --   --   --  1.2* 2.6  --    GFR: Estimated Creatinine Clearance: 56.4 mL/min (by C-G formula based on SCr of 0.69 mg/dL). Liver Function Tests: Recent Labs  Lab 04/09/18 0234 04/10/18 0250 04/11/18 0323 04/12/18 0412 04/13/18 0345  AST 95* 70* 58* 62* 58*  ALT 38 37 34 40 39  ALKPHOS 118 100 89 106 106  BILITOT 4.4* 4.2* 3.3* 3.2* 2.5*  PROT 6.4* 5.6* 4.8* 5.1* 4.7*  ALBUMIN 3.2* 2.7* 2.5* 2.6* 2.4*  Recent Labs  Lab 04/02/2018 1358  LIPASE 29   Recent Labs  Lab 04/10/18 0250 04/11/18 0323 04/12/18 0412 04/12/18 2202 04/13/18 0345  AMMONIA 66* 49* 47* 56* 23   Coagulation Profile: Recent Labs  Lab 04/04/2018 1548 04/13/18 0345  INR 1.48 1.65   Cardiac Enzymes: Recent Labs  Lab 03/23/2018 1358 04/09/18 0810 04/09/18 1120 04/10/18 0250 04/11/18 0323  CKTOTAL 1,223* 460*  --  254* 147  TROPONINI  --   --  1.50*  --   --    BNP (last 3 results) No results for input(s): PROBNP in the last 8760 hours. HbA1C: No results for input(s): HGBA1C in the last 72 hours. CBG: Recent Labs  Lab 03/23/2018 1320  GLUCAP 128*   Lipid Profile: No results for input(s): CHOL, HDL, LDLCALC, TRIG, CHOLHDL, LDLDIRECT in the last 72 hours. Thyroid Function Tests: No results for input(s): TSH, T4TOTAL, FREET4, T3FREE, THYROIDAB in the last 72 hours. Anemia Panel: No results for input(s): VITAMINB12, FOLATE, FERRITIN, TIBC, IRON, RETICCTPCT in the last 72 hours. Sepsis Labs: Recent Labs  Lab 04/13/2018 1451 04/01/2018 1804 04/05/2018 1842 04/07/2018 2114  LATICACIDVEN 4.29* 2.9* 3.34* 2.4*    Recent Results (from the past 240 hour(s))  Blood culture (routine x 2)     Status: None   Collection Time: 03/30/2018  3:48 PM  Result Value Ref Range Status    Specimen Description BLOOD LEFT ANTECUBITAL  Final   Special Requests   Final    BOTTLES DRAWN AEROBIC AND ANAEROBIC Blood Culture adequate volume   Culture   Final    NO GROWTH 5 DAYS Performed at [facility], [place] [address] [place], [place] [address]    Report Status 04/13/2018 FINAL  Final  Blood culture (routine x 2)     Status: None   Collection Time: 04/07/2018  3:48 PM  Result Value Ref Range Status   Specimen Description BLOOD BLOOD RIGHT HAND  Final   Special Requests   Final    BOTTLES DRAWN AEROBIC ONLY Blood Culture results may not be optimal due to an inadequate volume of blood received in culture bottles   Culture   Final    NO GROWTH 5 DAYS Performed at [facility], [place] [address] [place], [place] [address]    Report Status 04/13/2018 FINAL  Final         Radiology Studies: Dg Chest Port 1 View  Result Date: 04/13/2018 CLINICAL DATA:  Difficulty breathing EXAM: PORTABLE CHEST 1 VIEW COMPARISON:  04/14/2018 FINDINGS: Cardiac shadow is mildly enlarged but stable. Aortic calcifications are seen. The lungs are well aerated bilaterally with very minimal platelike atelectasis in the left base. No acute infiltrate or effusion is noted. No bony abnormality is noted. IMPRESSION: Minimal platelike atelectasis in the left base. Electronically Signed   By: Inez Catalina M.D.   On: 04/13/2018 07:59        Scheduled Meds: . chlorhexidine  15 mL Mouth Rinse BID  . cloNIDine  0.1 mg Oral TID  . dextroamphetamine  10 mg Oral QID  . feeding supplement (ENSURE ENLIVE)  237 mL Oral BID BM  . folic acid  1 mg Intravenous Daily  . heparin  5,000 Units Subcutaneous Q8H  . lactulose  30 g Oral TID  . mouth rinse  15 mL Mouth Rinse q12n4p  . rifaximin  550 mg Oral BID  . thiamine injection  100 mg Intravenous Daily   Continuous Infusions: . sodium chloride  60 mL/hr at 04/13/18 0540     LOS: 5 days    Time spent: 40 minutes    Irine Seal, MD [facility] Pager [phone]  If 7PM-7AM, please contact night-coverage [email or web address] Password [email or web address] 04/13/2018, 10:09 AM

## 2018-04-13 NOTE — Progress Notes (Signed)
  Speech Language Pathology Treatment: Dysphagia  Patient Details Name: Teresa Schneider MRN: [id number] DOB: July 06, 1945 Today's Date: 04/13/2018 Time: 0905-0920 SLP Time Calculation (min) (ACUTE ONLY): 15 min  Assessment / Plan / Recommendation Clinical Impression  Upon arrival of SLP, pt was noted to be writhing in bed, yelling out, trying to remove mitts and grabbing at her groin area. Pt was inconsolable despite requesting water. SLP and nursing repositioned pt twice to upright position to provide po trials, however, pt writhing did not stop, and she was almost immediately back to the middle of the bed.  SLP consulted with RN regarding pt diet tolerance. Per RN, pt has limited po intake, but has tolerated crushed meds in puree. ST will continue to follow to assess diet tolerance and readiness to advance.    HPI HPI: Teresa Schneider is a 72 y.o. female with history h/o alcoholic liver disease?  Varices, ascites, depression/ADD, sleep apnea noncompliant with CPAP, chronic iron deficiency anemia who apparently lives by herself brought in by friend after found to be confused and disoriented. Admitted with acute metabolic encephalopathy vs sepsis vs CVA. CT head negative for acute findings.       SLP Plan  Continue with current plan of care       Recommendations  Diet recommendations: Thin liquid(full liquid) Liquids provided via: Straw;Cup Medication Administration: Crushed with puree Supervision: Patient able to self feed;Full supervision/cueing for compensatory strategies Compensations: Slow rate;Small sips/bites;Lingual sweep for clearance of pocketing Postural Changes and/or Swallow Maneuvers: Seated upright 90 degrees                Oral Care Recommendations: Oral care BID Follow up Recommendations: 24 hour supervision/assistance SLP Visit Diagnosis: Dysphagia, oral phase (R13.11) Plan: Continue with current plan of care       GO               Nan Maya B. Quentin Ore  [facility], CCC-SLP Speech Language Pathologist [phone]  Shonna Chock 04/13/2018, 9:23 AM

## 2018-04-13 NOTE — Care Management Important Message (Signed)
Important Message  Patient Details  Name: Teresa Schneider MRN: [id number] Date of Birth: 19-Apr-1946   Medicare Important Message Given:  Yes    [name] 04/13/2018, 3:51 PM

## 2018-04-13 NOTE — Plan of Care (Signed)

## 2018-04-14 ENCOUNTER — Inpatient Hospital Stay (HOSPITAL_COMMUNITY): Payer: Medicare HMO

## 2018-04-14 DIAGNOSIS — E872 Acidosis, unspecified: Secondary | ICD-10-CM | POA: Diagnosis not present

## 2018-04-14 DIAGNOSIS — T68XXXA Hypothermia, initial encounter: Secondary | ICD-10-CM | POA: Diagnosis not present

## 2018-04-14 DIAGNOSIS — T68XXXD Hypothermia, subsequent encounter: Secondary | ICD-10-CM

## 2018-04-14 DIAGNOSIS — J9601 Acute respiratory failure with hypoxia: Secondary | ICD-10-CM

## 2018-04-14 LAB — URINE CULTURE
CULTURE: NO GROWTH
Culture: NO GROWTH

## 2018-04-14 LAB — URINALYSIS, ROUTINE W REFLEX MICROSCOPIC
Bilirubin Urine: NEGATIVE
Glucose, UA: 50 mg/dL — AB
Ketones, ur: NEGATIVE mg/dL
Nitrite: NEGATIVE
Protein, ur: 30 mg/dL — AB
Specific Gravity, Urine: 1.019 (ref 1.005–1.030)
pH: 5 (ref 5.0–8.0)

## 2018-04-14 LAB — POCT I-STAT 3, ART BLOOD GAS (G3+)
ACID-BASE DEFICIT: 15 mmol/L — AB (ref 0.0–2.0)
Acid-base deficit: 19 mmol/L — ABNORMAL HIGH (ref 0.0–2.0)
BICARBONATE: 8.5 mmol/L — AB (ref 20.0–28.0)
Bicarbonate: 14.1 mmol/L — ABNORMAL LOW (ref 20.0–28.0)
O2 Saturation: 85 %
O2 Saturation: 99 %
PO2 ART: 66 mmHg — AB (ref 83.0–108.0)
Patient temperature: 33
TCO2: 9 mmol/L — ABNORMAL LOW (ref 22–32)
TCO2: 16 mmol/L — ABNORMAL LOW (ref 22–32)
pCO2 arterial: 26.2 mmHg — ABNORMAL LOW (ref 32.0–48.0)
pCO2 arterial: 41 mmHg (ref 32.0–48.0)
pH, Arterial: 7.121 — CL (ref 7.350–7.450)
pH, Arterial: 7.118 — CL (ref 7.350–7.450)
pO2, Arterial: 158 mmHg — ABNORMAL HIGH (ref 83.0–108.0)

## 2018-04-14 LAB — CBC WITH DIFFERENTIAL/PLATELET
BASOS ABS: 0 10*3/uL (ref 0.0–0.1)
BLASTS: 0 %
Band Neutrophils: 0 %
Basophils Relative: 0 %
EOS PCT: 0 %
Eosinophils Absolute: 0 10*3/uL (ref 0.0–0.5)
HCT: 34 % — ABNORMAL LOW (ref 36.0–46.0)
HEMOGLOBIN: 10.3 g/dL — AB (ref 12.0–15.0)
LYMPHS ABS: 0.9 10*3/uL (ref 0.7–4.0)
Lymphocytes Relative: 9 %
MCH: 32.7 pg (ref 26.0–34.0)
MCHC: 30.3 g/dL (ref 30.0–36.0)
MCV: 107.9 fL — ABNORMAL HIGH (ref 80.0–100.0)
METAMYELOCYTES PCT: 0 %
MONOS PCT: 7 %
Monocytes Absolute: 0.7 10*3/uL (ref 0.1–1.0)
Myelocytes: 0 %
NEUTROS ABS: 8.2 10*3/uL — AB (ref 1.7–7.7)
NRBC: 1.2 % — AB (ref 0.0–0.2)
Neutrophils Relative %: 84 %
Other: 0 %
Platelets: 97 10*3/uL — ABNORMAL LOW (ref 150–400)
Promyelocytes Relative: 0 %
RBC: 3.15 MIL/uL — ABNORMAL LOW (ref 3.87–5.11)
RDW: 19.5 % — ABNORMAL HIGH (ref 11.5–15.5)
WBC MORPHOLOGY: INCREASED
WBC: 9.8 10*3/uL (ref 4.0–10.5)
nRBC: 0 /100 WBC

## 2018-04-14 LAB — POCT I-STAT, CHEM 8
BUN: 41 mg/dL — AB (ref 8–23)
CHLORIDE: 122 mmol/L — AB (ref 98–111)
CREATININE: 2 mg/dL — AB (ref 0.44–1.00)
Calcium, Ion: 1.01 mmol/L — ABNORMAL LOW (ref 1.15–1.40)
Glucose, Bld: 106 mg/dL — ABNORMAL HIGH (ref 70–99)
HCT: 23 % — ABNORMAL LOW (ref 36.0–46.0)
Hemoglobin: 7.8 g/dL — ABNORMAL LOW (ref 12.0–15.0)
Potassium: 3.7 mmol/L (ref 3.5–5.1)
Sodium: 151 mmol/L — ABNORMAL HIGH (ref 135–145)
TCO2: 11 mmol/L — ABNORMAL LOW (ref 22–32)

## 2018-04-14 LAB — BASIC METABOLIC PANEL
Anion gap: 10 (ref 5–15)
BUN: 41 mg/dL — ABNORMAL HIGH (ref 8–23)
CALCIUM: 5.7 mg/dL — AB (ref 8.9–10.3)
CO2: 14 mmol/L — ABNORMAL LOW (ref 22–32)
CREATININE: 2.07 mg/dL — AB (ref 0.44–1.00)
Chloride: 126 mmol/L — ABNORMAL HIGH (ref 98–111)
GFR calc Af Amer: 27 mL/min — ABNORMAL LOW (ref >60)
GFR calc non Af Amer: 24 mL/min — ABNORMAL LOW (ref >60)
Glucose, Bld: 105 mg/dL — ABNORMAL HIGH (ref 70–99)
Potassium: 3.9 mmol/L (ref 3.5–5.1)
SODIUM: 150 mmol/L — AB (ref 135–145)

## 2018-04-14 LAB — TSH: TSH: 1.117 u[IU]/mL (ref 0.350–4.500)

## 2018-04-14 LAB — CORTISOL: Cortisol, Plasma: >100 ug/dL

## 2018-04-14 LAB — GLUCOSE, CAPILLARY
Glucose-Capillary: 150 mg/dL — ABNORMAL HIGH (ref 70–99)
Glucose-Capillary: 102 mg/dL — ABNORMAL HIGH (ref 70–99)
Glucose-Capillary: 13 mg/dL — CL (ref 70–99)
Glucose-Capillary: 108 mg/dL — ABNORMAL HIGH (ref 70–99)

## 2018-04-14 LAB — LIPASE, BLOOD: Lipase: 81 U/L — ABNORMAL HIGH (ref 11–51)

## 2018-04-14 LAB — COMPREHENSIVE METABOLIC PANEL
ALBUMIN: 2.1 g/dL — AB (ref 3.5–5.0)
ALT: 66 U/L — AB (ref 0–44)
AST: 135 U/L — ABNORMAL HIGH (ref 15–41)
Alkaline Phosphatase: 84 U/L (ref 38–126)
Anion gap: 15 (ref 5–15)
BUN: 40 mg/dL — ABNORMAL HIGH (ref 8–23)
CHLORIDE: 124 mmol/L — AB (ref 98–111)
CO2: 10 mmol/L — AB (ref 22–32)
CREATININE: 1.79 mg/dL — AB (ref 0.44–1.00)
Calcium: 7.3 mg/dL — ABNORMAL LOW (ref 8.9–10.3)
GFR calc non Af Amer: 28 mL/min — ABNORMAL LOW (ref >60)
GFR, EST AFRICAN AMERICAN: 33 mL/min — AB (ref >60)
GLUCOSE: 22 mg/dL — AB (ref 70–99)
Potassium: 4.4 mmol/L (ref 3.5–5.1)
Sodium: 149 mmol/L — ABNORMAL HIGH (ref 135–145)
Total Bilirubin: 3.2 mg/dL — ABNORMAL HIGH (ref 0.3–1.2)
Total Protein: 4 g/dL — ABNORMAL LOW (ref 6.5–8.1)

## 2018-04-14 LAB — HEPATIC FUNCTION PANEL
ALT: 324 U/L — ABNORMAL HIGH (ref 0–44)
AST: 837 U/L — ABNORMAL HIGH (ref 15–41)
Albumin: 1.5 g/dL — ABNORMAL LOW (ref 3.5–5.0)
Alkaline Phosphatase: 67 U/L (ref 38–126)
BILIRUBIN TOTAL: 3.4 mg/dL — AB (ref 0.3–1.2)
Bilirubin, Direct: 1.4 mg/dL — ABNORMAL HIGH (ref 0.0–0.2)
Indirect Bilirubin: 2 mg/dL — ABNORMAL HIGH (ref 0.3–0.9)
Total Protein: 3.1 g/dL — ABNORMAL LOW (ref 6.5–8.1)

## 2018-04-14 LAB — LACTIC ACID, PLASMA: Lactic Acid, Venous: 8.3 mmol/L (ref 0.5–1.9)

## 2018-04-14 LAB — PROTIME-INR
INR: 4.32
Prothrombin Time: 40.8 seconds — ABNORMAL HIGH (ref 11.4–15.2)

## 2018-04-14 LAB — CREATININE, URINE, RANDOM: Creatinine, Urine: 201.67 mg/dL

## 2018-04-14 LAB — PROCALCITONIN: Procalcitonin: 26.35 ng/mL

## 2018-04-14 LAB — SODIUM, URINE, RANDOM: Sodium, Ur: <10 mmol/L

## 2018-04-14 LAB — MAGNESIUM: Magnesium: 2.3 mg/dL (ref 1.7–2.4)

## 2018-04-14 LAB — AMMONIA: Ammonia: 68 umol/L — ABNORMAL HIGH (ref 9–35)

## 2018-04-14 MED ORDER — FENTANYL CITRATE (PF) 100 MCG/2ML IJ SOLN
INTRAMUSCULAR | Status: AC
  Filled 2018-04-14: qty 2

## 2018-04-14 MED ORDER — VASOPRESSIN 20 UNIT/ML IV SOLN
0.0100 [IU]/min | INTRAVENOUS | Status: DC
  Administered 2018-04-14: 0.03 [IU]/min via INTRAVENOUS
  Filled 2018-04-14: qty 2

## 2018-04-14 MED ORDER — SODIUM CHLORIDE 0.9 % IV SOLN: INTRAVENOUS | Status: DC | PRN

## 2018-04-14 MED ORDER — DEXTROSE 5 % IV SOLN
INTRAVENOUS | Status: DC
  Administered 2018-04-14: 09:00:00 via INTRAVENOUS

## 2018-04-14 MED ORDER — SODIUM CHLORIDE 0.9 % FOR CRRT
INTRAVENOUS_CENTRAL | Status: DC | PRN
  Filled 2018-04-14: qty 1000

## 2018-04-14 MED ORDER — GLYCOPYRROLATE 0.2 MG/ML IJ SOLN: 0.2000 mg | INTRAMUSCULAR | Status: DC | PRN

## 2018-04-14 MED ORDER — DEXTROSE 50 % IV SOLN
INTRAVENOUS | Status: AC
  Filled 2018-04-14: qty 50

## 2018-04-14 MED ORDER — DEXTROSE 5 % IV SOLN
INTRAVENOUS | Status: DC
  Administered 2018-04-14: 23:00:00 via INTRAVENOUS

## 2018-04-14 MED ORDER — VANCOMYCIN HCL 500 MG IV SOLR: 500.0000 mg | INTRAVENOUS | Status: DC

## 2018-04-14 MED ORDER — MIDAZOLAM HCL 2 MG/2ML IJ SOLN
2.0000 mg | Freq: Once | INTRAMUSCULAR | Status: AC
  Administered 2018-04-14: 2 mg via INTRAVENOUS

## 2018-04-14 MED ORDER — MIDAZOLAM HCL 2 MG/2ML IJ SOLN
INTRAMUSCULAR | Status: AC
  Filled 2018-04-14: qty 4

## 2018-04-14 MED ORDER — ACETAMINOPHEN 325 MG PO TABS: 650.0000 mg | ORAL_TABLET | Freq: Four times a day (QID) | ORAL | Status: DC | PRN

## 2018-04-14 MED ORDER — SODIUM CHLORIDE 0.9 % IV SOLN
1.0000 g | INTRAVENOUS | Status: DC
  Filled 2018-04-14: qty 1

## 2018-04-14 MED ORDER — VANCOMYCIN HCL IN DEXTROSE 1-5 GM/200ML-% IV SOLN: 1000.0000 mg | INTRAVENOUS | Status: DC

## 2018-04-14 MED ORDER — DOCUSATE SODIUM 50 MG/5ML PO LIQD: 100.0000 mg | Freq: Two times a day (BID) | ORAL | Status: DC | PRN

## 2018-04-14 MED ORDER — GLYCOPYRROLATE 1 MG PO TABS
1.0000 mg | ORAL_TABLET | ORAL | Status: DC | PRN
  Filled 2018-04-14: qty 1

## 2018-04-14 MED ORDER — DEXTROSE 50 % IV SOLN
INTRAVENOUS | Status: AC
  Administered 2018-04-14: 07:00:00
  Filled 2018-04-14: qty 50

## 2018-04-14 MED ORDER — FENTANYL CITRATE (PF) 100 MCG/2ML IJ SOLN
50.0000 ug | Freq: Once | INTRAMUSCULAR | Status: AC
  Administered 2018-04-14: 50 ug via INTRAVENOUS

## 2018-04-14 MED ORDER — SODIUM CHLORIDE 0.9 % IV SOLN
250.0000 mL | INTRAVENOUS | Status: DC
  Administered 2018-04-14: 250 mL via INTRAVENOUS

## 2018-04-14 MED ORDER — PRISMASOL BGK 4/2.5 32-4-2.5 MEQ/L REPLACEMENT SOLN
Status: DC
  Filled 2018-04-14 (×3): qty 5000

## 2018-04-14 MED ORDER — MIDAZOLAM HCL 2 MG/2ML IJ SOLN
1.0000 mg | Freq: Once | INTRAMUSCULAR | Status: AC
  Administered 2018-04-14: 1 mg via INTRAVENOUS

## 2018-04-14 MED ORDER — DIPHENHYDRAMINE HCL 50 MG/ML IJ SOLN: 25.0000 mg | INTRAMUSCULAR | Status: DC | PRN

## 2018-04-14 MED ORDER — SODIUM CHLORIDE 0.9 % IV SOLN
2.0000 g | Freq: Once | INTRAVENOUS | Status: AC
  Administered 2018-04-14: 2 g via INTRAVENOUS
  Filled 2018-04-14: qty 20

## 2018-04-14 MED ORDER — HEPARIN SODIUM (PORCINE) 1000 UNIT/ML DIALYSIS: 1000.0000 [IU] | INTRAMUSCULAR | Status: DC | PRN

## 2018-04-14 MED ORDER — NOREPINEPHRINE 4 MG/250ML-% IV SOLN
2.0000 ug/min | INTRAVENOUS | Status: DC
  Filled 2018-04-14 (×2): qty 250

## 2018-04-14 MED ORDER — ROCURONIUM BROMIDE 50 MG/5ML IV SOLN
1.0000 mg/kg | Freq: Once | INTRAVENOUS | Status: DC
  Filled 2018-04-14: qty 5.46

## 2018-04-14 MED ORDER — MIDAZOLAM HCL 2 MG/2ML IJ SOLN: 2.0000 mg | INTRAMUSCULAR | Status: DC | PRN

## 2018-04-14 MED ORDER — MIDAZOLAM HCL 2 MG/2ML IJ SOLN
INTRAMUSCULAR | Status: AC
  Filled 2018-04-14: qty 2

## 2018-04-14 MED ORDER — SODIUM CHLORIDE 0.9 % IV BOLUS
1000.0000 mL | Freq: Once | INTRAVENOUS | Status: AC
  Administered 2018-04-14: 1000 mL via INTRAVENOUS

## 2018-04-14 MED ORDER — SODIUM CHLORIDE 0.9 % IV SOLN
2.0000 g | Freq: Two times a day (BID) | INTRAVENOUS | Status: DC
  Filled 2018-04-14: qty 2

## 2018-04-14 MED ORDER — SUCCINYLCHOLINE CHLORIDE 20 MG/ML IJ SOLN
60.0000 mg | Freq: Once | INTRAMUSCULAR | Status: AC
  Administered 2018-04-14: 60 mg via INTRAVENOUS
  Filled 2018-04-14: qty 3

## 2018-04-14 MED ORDER — VITAMIN K1 10 MG/ML IJ SOLN
2.0000 mg | Freq: Once | INTRAVENOUS | Status: AC
  Administered 2018-04-14: 2 mg via INTRAVENOUS
  Filled 2018-04-14: qty 0.2

## 2018-04-14 MED ORDER — ALBUMIN HUMAN 25 % IV SOLN: 25.0000 g | Freq: Three times a day (TID) | INTRAVENOUS | Status: DC

## 2018-04-14 MED ORDER — ETOMIDATE 2 MG/ML IV SOLN
14.0000 mg | Freq: Once | INTRAVENOUS | Status: AC
  Administered 2018-04-14: 14 mg via INTRAVENOUS

## 2018-04-14 MED ORDER — SODIUM BICARBONATE 8.4 % IV SOLN
100.0000 meq | Freq: Once | INTRAVENOUS | Status: AC
  Administered 2018-04-14: 100 meq via INTRAVENOUS

## 2018-04-14 MED ORDER — SODIUM CHLORIDE 0.9 % IV SOLN
50.0000 ug/h | INTRAVENOUS | Status: DC
  Filled 2018-04-14 (×2): qty 1

## 2018-04-14 MED ORDER — FENTANYL CITRATE (PF) 100 MCG/2ML IJ SOLN: 100.0000 ug | Freq: Once | INTRAMUSCULAR | Status: AC

## 2018-04-14 MED ORDER — FENTANYL BOLUS VIA INFUSION
25.0000 ug | INTRAVENOUS | Status: DC | PRN
  Filled 2018-04-14: qty 25

## 2018-04-14 MED ORDER — VASOPRESSIN 20 UNIT/ML IV SOLN
0.0300 [IU]/min | INTRAVENOUS | Status: DC
  Filled 2018-04-14: qty 2

## 2018-04-14 MED ORDER — SODIUM BICARBONATE 8.4 % IV SOLN
INTRAVENOUS | Status: DC
  Administered 2018-04-14: 13:00:00 via INTRAVENOUS
  Filled 2018-04-14 (×3): qty 150

## 2018-04-14 MED ORDER — POLYVINYL ALCOHOL 1.4 % OP SOLN
1.0000 [drp] | Freq: Four times a day (QID) | OPHTHALMIC | Status: DC | PRN
  Filled 2018-04-14: qty 15

## 2018-04-14 MED ORDER — ETOMIDATE 2 MG/ML IV SOLN: 0.3000 mg/kg | Freq: Once | INTRAVENOUS | Status: AC

## 2018-04-14 MED ORDER — FENTANYL 2500MCG IN NS 250ML (10MCG/ML) PREMIX INFUSION
25.0000 ug/h | INTRAVENOUS | Status: DC
  Administered 2018-04-14: 50 ug/h via INTRAVENOUS
  Filled 2018-04-14 (×2): qty 250

## 2018-04-14 MED ORDER — NOREPINEPHRINE 16 MG/250ML-% IV SOLN
2.0000 ug/min | INTRAVENOUS | Status: DC
  Administered 2018-04-14: 49 ug/min via INTRAVENOUS
  Filled 2018-04-14 (×2): qty 250

## 2018-04-14 MED ORDER — VANCOMYCIN HCL IN DEXTROSE 1-5 GM/200ML-% IV SOLN
1000.0000 mg | Freq: Once | INTRAVENOUS | Status: DC
  Filled 2018-04-14: qty 200

## 2018-04-14 MED ORDER — DEXTROSE 50 % IV SOLN
INTRAVENOUS | Status: AC
  Administered 2018-04-14: 50 mL
  Filled 2018-04-14: qty 50

## 2018-04-14 MED ORDER — PRISMASOL BGK 4/2.5 32-4-2.5 MEQ/L IV SOLN
INTRAVENOUS | Status: DC
  Filled 2018-04-14 (×6): qty 5000

## 2018-04-14 MED ORDER — MORPHINE 100MG IN NS 100ML (1MG/ML) PREMIX INFUSION
1.0000 mg/h | INTRAVENOUS | Status: DC
  Administered 2018-04-14: 5 mg/h via INTRAVENOUS
  Filled 2018-04-14: qty 100

## 2018-04-14 MED ORDER — MIDAZOLAM HCL 2 MG/2ML IJ SOLN
4.0000 mg | Freq: Once | INTRAMUSCULAR | Status: AC
  Administered 2018-04-14: 4 mg via INTRAVENOUS

## 2018-04-14 MED ORDER — ACETAMINOPHEN 650 MG RE SUPP: 650.0000 mg | Freq: Four times a day (QID) | RECTAL | Status: DC | PRN

## 2018-04-14 MED ORDER — HYDROCORTISONE NA SUCCINATE PF 100 MG IJ SOLR
50.0000 mg | Freq: Four times a day (QID) | INTRAMUSCULAR | Status: DC
  Administered 2018-04-14: 50 mg via INTRAVENOUS
  Filled 2018-04-14: qty 2

## 2018-04-14 MED ORDER — SODIUM CHLORIDE 0.9 % IV SOLN
1.0000 g | INTRAVENOUS | Status: DC
  Administered 2018-04-14: 1 g via INTRAVENOUS
  Filled 2018-04-14: qty 1

## 2018-04-14 NOTE — Progress Notes (Signed)
Pharmacy Antibiotic Note  Teresa Schneider is a 72 y.o. female with possible sepsis. Pharmacy has been consulted for cefepime and vancomycin dosing. -WBC= 9.8, hypothermic, SCr 0.69>> 1.79 (CrCl ~ 25)   Plan: -Vancomycin 1000mg  IV now -Cefepime 1gm IV q24hr -Will follow renal function, cultures and clinical progress   Height: 5\' 5"  (165.1 cm)(est. non-verbal) Weight: 120 lb 5.9 oz (54.6 kg) IBW/kg (Calculated) : 57  Temp (24hrs), Avg:95.8 F (35.4 C), Min:93.6 F (34.2 C), Max:97.6 F (36.4 C)  Recent Labs  Lab 03/22/2018 1451 03/27/2018 1804 04/13/2018 1842 03/25/2018 2114  04/10/18 0250 04/11/18 0323 04/11/18 0721 04/12/18 0412 04/13/18 0345 04/14/18 0331  WBC  --   --   --   --    < > 19.1*  --  22.5* 27.8* 22.8* 9.8  CREATININE  --   --   --   --    < > 0.74 0.75  --  0.78 0.69 1.79*  LATICACIDVEN 4.29* 2.9* 3.34* 2.4*  --   --   --   --   --   --   --    < > = values in this interval not displayed.    Estimated Creatinine Clearance: 25.2 mL/min (A) (by C-G formula based on SCr of 1.79 mg/dL (H)).    Allergies  Allergen Reactions  . Salmon [Fish Allergy] Dermatitis  . Other Dermatitis    Paprika  . Sulfamethoxazole-Trimethoprim     REACTION: rash  . Penicillins Rash  . Pyridium [Phenazopyridine Hcl] Rash    Antimicrobials this admission: 11/28 vanc>> 11/28 cefepime  Dose adjustments this admission:   Microbiology results: 11/29 urine 11/22 blood x2- neg  Thank you for allowing pharmacy to be a part of this patient's care.  Hildred Laser, PharmD Clinical Pharmacist **Pharmacist phone directory can now be found on [email or web address] ([email or web address]).  Listed under [place].

## 2018-04-14 NOTE — Progress Notes (Addendum)
Patient's nephew who claims to be HCPOA called this RN's phone. No patient information given over the phone until paperwork sent to [place]. Paperwork received and placed in chart. Agarwala MD discussed patient's status over the phone with patient.   Critical INR, calcium, and lactic acid received and notified Agarwala MD. See new orders.

## 2018-04-14 NOTE — Progress Notes (Addendum)
Patient transferred to [facility] by rapid response nurse on a hospital bed, report given by this RN to the receiving nurse on [place].

## 2018-04-14 NOTE — Progress Notes (Signed)
Patient brought to 2H10 from [facility]East. Patient lethargic and flailing. Central line placed by Agarwala MD. Orders for four point restraints, and a total of 2 of Versed and 50 of Fentanyl during procedure. Once line verified to be in the correct place, levophed gtt started and patient was intubated. Another 2 of Versed, 50 of Fentanyl, 14 of Etomidate, and 60 of Succinylcholine given. OGT placed by MD and foley placed by [facility]. RRT to place arterial line. 1 liter bolus given during procedure and two more given afterwards. Patient's BP unstable, levophed still going and vasopressin to be added. Will get CT scan as patient stabilizes.

## 2018-04-14 NOTE — Progress Notes (Signed)
HCPOA nephew Charlesetta Garibaldi: [phone]

## 2018-04-14 NOTE — Progress Notes (Signed)
Patient very restless, Rectal Temp 94.4, bear hugger and warm blankets provided, BP 94/40, HR 74, Respiration 24, Thompson MD at bedside, new orders received, will monitor

## 2018-04-14 NOTE — Procedures (Signed)
Extubation Procedure Note  Patient Details:   Name: Teresa Schneider DOB: 09/29/1945 MRN: [id number]   Airway Documentation:  Airway 7.5 mm (Active)  Secured at (cm) 23 cm 04/14/2018  8:00 PM  Measured From Lips 04/14/2018  8:00 PM  [place] 04/14/2018  8:00 PM  Secured By [employer] 04/14/2018  8:00 PM  Tube Holder Repositioned Yes 04/14/2018  3:04 PM  Cuff Pressure (cm H2O) 30 cm H2O 04/14/2018  3:04 PM  Site Condition Dry 04/14/2018  8:00 PM   Vent end date: 04/14/18 Vent end time: 2255   Evaluation  O2 sats: stable throughout Complications: No apparent complications Patient did tolerate procedure well. Bilateral Breath Sounds: Diminished, Rhonchi   No  Chriss Driver [facility] 04/14/2018, 10:57 PM

## 2018-04-14 NOTE — Consult Note (Signed)
Reason for Consult:AKI, metabolic acidosis. Referring Physician: Dr.Agarwala HPI: 72 yr female with hx HTN, DJD, Depression, ADD.  Has known alcoholic cirrhosis, gastric and esophageal varices and coagulopathy . Found confused at home 6 d ago.  Has been tx for alcoholic hepatitis, poss sepsis, with AB, lactulose.  Has had signif D.  Has been pos over 8 L of IVF.  At home on Celebrex, Losartan. Baseline Cr No flowsheet data found. -.8.  This am 1.78 and 2.0 later this am.. ACidemic this hosp with D,  And this am was bicarb of 10.  Bp 70 s-90s the  Past 48 h. Hypothermic. WBC ^ on admit and now 9.8.  Oliguric for 48 h,  Has U Na<10, U Cr 201/ Review of systems not obtained due to patient factors.   Past Medical History:  Diagnosis Date  . ADD (attention deficit disorder)   . Alcoholic hepatitis   . Anemia    low iron  . Ascites   . Depression   . Palpitations   . Sleep apnea    does not use cpap    Past Surgical History:  Procedure Laterality Date  . COLONOSCOPY    . FRACTURE SURGERY Right    wrist  . JOINT REPLACEMENT Right    hip  . MASTECTOMY Bilateral    2 different times    Family History  Problem Relation Age of Onset  . Suicidality Sister   . Glaucoma Mother   . Depression Mother   . Cancer Father     Social History:  reports that she has been smoking cigarettes. She has a 26.25 pack-year smoking history. She has never used smokeless tobacco. She reports that she drinks alcohol. She reports that she does not use drugs.  Allergies:  Allergies  Allergen Reactions  . Salmon [Fish Allergy] Dermatitis  . Other Dermatitis    Paprika  . Sulfamethoxazole-Trimethoprim     REACTION: rash  . Penicillins Rash  . Pyridium [Phenazopyridine Hcl] Rash    Medications:  I have reviewed the patient's current medications. Prior to Admission:  Medications Prior to Admission  Medication Sig Dispense Refill Last Dose  . celecoxib (CELEBREX) 200 MG capsule Take 1 capsule (200  mg total) by mouth daily. 30 capsule 0 unknown  . dextroamphetamine (DEXTROSTAT) 10 MG tablet Take 10 mg by mouth 4 (four) times daily.   unknown  . furosemide (LASIX) 20 MG tablet Take 1 tablet by mouth every other day 45 tablet 0 unknown  . ibandronate (BONIVA) 150 MG tablet One po monthly on empty stomach. (Patient taking differently: Take 150 mg by mouth every 30 (thirty) days. ) 3 tablet 3 unknown  . losartan (COZAAR) 25 MG tablet TAKE 1 TABLET EVERY DAY (Patient taking differently: Take 25 mg by mouth daily. ) 90 tablet 1 unknown  . rosuvastatin (CRESTOR) 5 MG tablet TAKE 1 TABLET EVERY DAY (Patient taking differently: Take 5 mg by mouth daily. ) 90 tablet 3 unknown  . tiZANidine (ZANAFLEX) 4 MG tablet Take 12 mg by mouth at bedtime.    unknown  . aspirin EC 81 MG tablet Take 81 mg by mouth daily.   unsure??  . Calcium Carbonate-Vit D-Min (CALCIUM 1200 PO) Take 1,200 mg by mouth daily.   unsure??  . Multiple Vitamins-Minerals (CENTRUM SILVER 50+WOMEN PO) Take by mouth.   unsure??  . Vitamin D, Ergocalciferol, (DRISDOL) 50000 units CAPS capsule Take 50,000 Units by mouth every 7 (seven) days.   unsure??  .  Results for orders placed or performed during the hospital encounter of 04/07/2018 (from the past 48 hour(s))  Urinalysis, Routine w reflex microscopic     Status: Abnormal   Collection Time: 04/12/18  9:38 PM  Result Value Ref Range   Color, Urine AMBER (A) YELLOW    Comment: BIOCHEMICALS MAY BE AFFECTED BY COLOR   APPearance CLEAR CLEAR   Specific Gravity, Urine 1.018 1.005 - 1.030   pH 6.0 5.0 - 8.0   Glucose, UA NEGATIVE NEGATIVE mg/dL   Hgb urine dipstick NEGATIVE NEGATIVE   Bilirubin Urine NEGATIVE NEGATIVE   Ketones, ur NEGATIVE NEGATIVE mg/dL   Protein, ur 30 (A) NEGATIVE mg/dL   Nitrite NEGATIVE NEGATIVE   Leukocytes, UA TRACE (A) NEGATIVE   RBC / HPF 0-5 0 - 5 RBC/hpf   WBC, UA 0-5 0 - 5 WBC/hpf   Bacteria, UA NONE SEEN NONE SEEN   Squamous Epithelial / LPF 0-5 0 -  5   Mucus PRESENT     Comment: Performed at [facility], [place] [address], [place], [place] [address]  Ammonia     Status: Abnormal   Collection Time: 04/12/18 10:02 PM  Result Value Ref Range   Ammonia 56 (H) 9 - 35 umol/L    Comment: Performed at [facility], [place] [address], [place], [place] [address]  Ammonia     Status: None   Collection Time: 04/13/18  3:45 AM  Result Value Ref Range   Ammonia 23 9 - 35 umol/L    Comment: Performed at [facility], [place] [address] [place], [place] [address]  Phosphorus     Status: None   Collection Time: 04/13/18  3:45 AM  Result Value Ref Range   Phosphorus 2.6 2.5 - 4.6 mg/dL    Comment: Performed at [facility] [address], [place], [place] [address]  Protime-INR     Status: Abnormal   Collection Time: 04/13/18  3:45 AM  Result Value Ref Range   Prothrombin Time 19.3 (H) 11.4 - 15.2 seconds   INR 1.65     Comment: Performed at [facility] [address], [place], [place] [address]  Comprehensive metabolic panel     Status: Abnormal   Collection Time: 04/13/18  3:45 AM  Result Value Ref Range   Sodium 145 135 - 145 mmol/L   Potassium 3.5 3.5 - 5.1 mmol/L   Chloride 122 (H) 98 - 111 mmol/L   CO2 20 (L) 22 - 32 mmol/L   Glucose, Bld 108 (H) 70 - 99 mg/dL   BUN 24 (H) 8 - 23 mg/dL   Creatinine, Ser 0.69 0.44 - 1.00 mg/dL   Calcium 7.8 (L) 8.9 - 10.3 mg/dL   Total Protein 4.7 (L) 6.5 - 8.1 g/dL   Albumin 2.4 (L) 3.5 - 5.0 g/dL   AST 58 (H) 15 - 41 U/L   ALT 39 0 - 44 U/L   Alkaline Phosphatase 106 38 - 126 U/L   Total Bilirubin 2.5 (H) 0.3 - 1.2 mg/dL   GFR calc non Af Amer >60 >60 mL/min   GFR calc Af Amer >60 >60 mL/min   Anion gap 3 (L) 5 - 15    Comment: Performed at [facility], [place] [address], [place], [place] [address]  CBC     Status: Abnormal   Collection Time: 04/13/18  3:45 AM  Result Value Ref Range   WBC 22.8 (H) 4.0 -  10.5 K/uL   RBC 3.07 (L) 3.87 - 5.11 MIL/uL   Hemoglobin  10.0 (L) 12.0 - 15.0 g/dL   HCT 31.4 (L) 36.0 - 46.0 %   MCV 102.3 (H) 80.0 - 100.0 fL   MCH 32.6 26.0 - 34.0 pg   MCHC 31.8 30.0 - 36.0 g/dL   RDW 18.6 (H) 11.5 - 15.5 %   Platelets 83 (L) 150 - 400 K/uL    Comment: REPEATED TO VERIFY Immature Platelet Fraction may be clinically indicated, consider ordering this additional test [id number] CONSISTENT WITH PREVIOUS RESULT    nRBC 0.2 0.0 - 0.2 %    Comment: Performed at [facility], [place] [address], [place], [place] [address]  Magnesium     Status: None   Collection Time: 04/13/18  3:46 AM  Result Value Ref Range   Magnesium 2.2 1.7 - 2.4 mg/dL    Comment: Performed at [facility] [address] [place], [place] [address]  Urine Culture     Status: None   Collection Time: 04/13/18 10:17 AM  Result Value Ref Range   Specimen Description URINE, RANDOM    Special Requests NONE    Culture      NO GROWTH Performed at [facility], [place] [address], [place], [place] [address]    Report Status 04/14/2018 FINAL   Comprehensive metabolic panel     Status: Abnormal   Collection Time: 04/14/18  3:31 AM  Result Value Ref Range   Sodium 149 (H) 135 - 145 mmol/L   Potassium 4.4 3.5 - 5.1 mmol/L    Comment: NO VISIBLE HEMOLYSIS   Chloride 124 (H) 98 - 111 mmol/L   CO2 10 (L) 22 - 32 mmol/L   Glucose, Bld 22 (LL) 70 - 99 mg/dL    Comment: CRITICAL RESULT CALLED TO, READ BACK BY AND VERIFIED WITH: A BOWEN,RN [id number] WILDERK    BUN 40 (H) 8 - 23 mg/dL   Creatinine, Ser 1.79 (H) 0.44 - 1.00 mg/dL    Comment: DELTA CHECK NOTED   Calcium 7.3 (L) 8.9 - 10.3 mg/dL   Total Protein 4.0 (L) 6.5 - 8.1 g/dL   Albumin 2.1 (L) 3.5 - 5.0 g/dL   AST 135 (H) 15 - 41 U/L   ALT 66 (H) 0 - 44 U/L   Alkaline Phosphatase 84 38 - 126 U/L   Total Bilirubin 3.2 (H) 0.3 - 1.2 mg/dL   GFR calc non Af Amer 28 (L) >60 mL/min   GFR calc Af Amer 33 (L) >60 mL/min   Anion gap 15 5 - 15    Comment: Performed at [facility],  [place] [address], [place], [place] [address]  CBC with Differential/Platelet     Status: Abnormal   Collection Time: 04/14/18  3:31 AM  Result Value Ref Range   WBC 9.8 4.0 - 10.5 K/uL   RBC 3.15 (L) 3.87 - 5.11 MIL/uL   Hemoglobin 10.3 (L) 12.0 - 15.0 g/dL   HCT 34.0 (L) 36.0 - 46.0 %   MCV 107.9 (H) 80.0 - 100.0 fL   MCH 32.7 26.0 - 34.0 pg   MCHC 30.3 30.0 - 36.0 g/dL   RDW 19.5 (H) 11.5 - 15.5 %   Platelets 97 (L) 150 - 400 K/uL    Comment: Immature Platelet Fraction may be clinically indicated, consider ordering this additional test [id number] REPEATED TO VERIFY PLATELET COUNT CONFIRMED BY SMEAR SPECIMEN CHECKED FOR CLOTS  nRBC 1.2 (H) 0.0 - 0.2 %   Neutrophils Relative % 84 %   Lymphocytes Relative 9 %   Monocytes Relative 7 %   Eosinophils Relative 0 %   Basophils Relative 0 %   Band Neutrophils 0 %   Metamyelocytes Relative 0 %   Myelocytes 0 %   Promyelocytes Relative 0 %   Blasts 0 %   nRBC 0 0 /100 WBC   Other 0 %   Neutro Abs 8.2 (H) 1.7 - 7.7 K/uL   Lymphs Abs 0.9 0.7 - 4.0 K/uL   Monocytes Absolute 0.7 0.1 - 1.0 K/uL   Eosinophils Absolute 0.0 0.0 - 0.5 K/uL   Basophils Absolute 0.0 0.0 - 0.1 K/uL   RBC Morphology POLYCHROMASIA PRESENT     Comment: BURR CELLS TEARDROP CELLS    WBC Morphology INCREASED BANDS (>20% BANDS)     Comment: MODERATE LEFT SHIFT (>5% METAS AND MYELOS,OCC PRO NOTED) TOXIC GRANULATION VACUOLATED NEUTROPHILS SMUDGE CELLS Performed at [facility], [address] [place], [place] [address]   Magnesium     Status: None   Collection Time: 04/14/18  3:31 AM  Result Value Ref Range   Magnesium 2.3 1.7 - 2.4 mg/dL    Comment: Performed at [facility] [address], [place], [place] [address]  Glucose, capillary     Status: Abnormal   Collection Time: 04/14/18  6:28 AM  Result Value Ref Range   Glucose-Capillary 150 (H) 70 - 99 mg/dL   Comment 1 Notify RN    Comment 2 Document in Chart   Glucose, capillary     Status:  Abnormal   Collection Time: 04/14/18  6:47 AM  Result Value Ref Range   Glucose-Capillary 102 (H) 70 - 99 mg/dL   Comment 1 Notify RN    Comment 2 Document in Chart   Sodium, urine, random     Status: None   Collection Time: 04/14/18  7:47 AM  Result Value Ref Range   Sodium, Ur <10 mmol/L    Comment: RESULT REPEATED AND VERIFIED Performed at [facility] [address], [place], [place] [address]   Creatinine, urine, random     Status: None   Collection Time: 04/14/18  7:47 AM  Result Value Ref Range   Creatinine, Urine 201.67 mg/dL    Comment: Performed at [facility] [address], [place], [place] [address]  Urine Culture     Status: None   Collection Time: 04/14/18  7:47 AM  Result Value Ref Range   Specimen Description URINE, RANDOM    Special Requests NONE    Culture      NO GROWTH Performed at [facility], [place] [address] [place], [place] [address]    Report Status 04/14/2018 FINAL   Urinalysis, Routine w reflex microscopic     Status: Abnormal   Collection Time: 04/14/18  7:48 AM  Result Value Ref Range   Color, Urine AMBER (A) YELLOW    Comment: BIOCHEMICALS MAY BE AFFECTED BY COLOR   APPearance HAZY (A) CLEAR   Specific Gravity, Urine 1.019 1.005 - 1.030   pH 5.0 5.0 - 8.0   Glucose, UA 50 (A) NEGATIVE mg/dL   Hgb urine dipstick SMALL (A) NEGATIVE   Bilirubin Urine NEGATIVE NEGATIVE   Ketones, ur NEGATIVE NEGATIVE mg/dL   Protein, ur 30 (A) NEGATIVE mg/dL   Nitrite NEGATIVE NEGATIVE   Leukocytes, UA TRACE (A) NEGATIVE   RBC /  HPF 6-10 0 - 5 RBC/hpf   WBC, UA 11-20 0 - 5 WBC/hpf   Bacteria, UA RARE (A) NONE SEEN   Squamous Epithelial / LPF 0-5 0 - 5   Mucus PRESENT    Hyaline Casts, UA PRESENT     Comment: Performed at [facility], [place] [address], [place], [place] [address]  Glucose, capillary     Status: Abnormal   Collection Time: 04/14/18 10:29 AM  Result Value Ref Range   Glucose-Capillary 13 (LL) 70 - 99 mg/dL    Comment 1 Notify RN    Comment 2 Document in Chart   Glucose, capillary     Status: Abnormal   Collection Time: 04/14/18 10:38 AM  Result Value Ref Range   Glucose-Capillary 108 (H) 70 - 99 mg/dL  I-STAT, chem 8     Status: Abnormal   Collection Time: 04/14/18 11:04 AM  Result Value Ref Range   Sodium 151 (H) 135 - 145 mmol/L   Potassium 3.7 3.5 - 5.1 mmol/L   Chloride 122 (H) 98 - 111 mmol/L   BUN 41 (H) 8 - 23 mg/dL   Creatinine, Ser 2.00 (H) 0.44 - 1.00 mg/dL   Glucose, Bld 106 (H) 70 - 99 mg/dL   Calcium, Ion 1.01 (L) 1.15 - 1.40 mmol/L   TCO2 11 (L) 22 - 32 mmol/L   Hemoglobin 7.8 (L) 12.0 - 15.0 g/dL   HCT 23.0 (L) 36.0 - 46.0 %  I-STAT 3, arterial blood gas (G3+)     Status: Abnormal   Collection Time: 04/14/18 11:06 AM  Result Value Ref Range   pH, Arterial 7.121 (LL) 7.350 - 7.450   pCO2 arterial 26.2 (L) 32.0 - 48.0 mmHg   pO2, Arterial 66.0 (L) 83.0 - 108.0 mmHg   Bicarbonate 8.5 (L) 20.0 - 28.0 mmol/L   TCO2 9 (L) 22 - 32 mmol/L   O2 Saturation 85.0 %   Acid-base deficit 19.0 (H) 0.0 - 2.0 mmol/L   Patient temperature HIDE    Collection site RADIAL, ALLEN'S TEST ACCEPTABLE    Drawn by RT    Sample type ARTERIAL    Comment NOTIFIED PHYSICIAN   Ammonia     Status: Abnormal   Collection Time: 04/14/18  3:13 PM  Result Value Ref Range   Ammonia 68 (H) 9 - 35 umol/L    Comment: Performed at [facility], [address], [place], [place] [address]  Procalcitonin - Baseline     Status: None   Collection Time: 04/14/18  3:14 PM  Result Value Ref Range   Procalcitonin 26.35 ng/mL    Comment:        Interpretation: PCT >= 10 ng/mL: Important systemic inflammatory response, almost exclusively due to severe bacterial sepsis or septic shock. (NOTE)       Sepsis PCT Algorithm           Lower Respiratory Tract                                      Infection PCT Algorithm    ----------------------------     ----------------------------         PCT < 0.25 ng/mL                 PCT < 0.10 ng/mL         Strongly encourage  Strongly discourage   discontinuation of antibiotics    initiation of antibiotics    ----------------------------     -----------------------------       PCT 0.25 - 0.50 ng/mL            PCT 0.10 - 0.25 ng/mL               OR       >80% decrease in PCT            Discourage initiation of                                            antibiotics      Encourage discontinuation           of antibiotics    ----------------------------     -----------------------------         PCT >= 0.50 ng/mL              PCT 0.26 - 0.50 ng/mL                AND       <80% decrease in PCT             Encourage initiation of                                             antibiotics       Encourage continuation           of antibiotics    ----------------------------     -----------------------------        PCT >= 0.50 ng/mL                  PCT > 0.50 ng/mL               AND         increase in PCT                  Strongly encourage                                      initiation of antibiotics    Strongly encourage escalation           of antibiotics                                     -----------------------------                                           PCT <= 0.25 ng/mL                                                 OR                                        >  80% decrease in PCT                                     Discontinue / Do not initiate                                             antibiotics Performed at [facility], [place] [address], [place], [place] [address]   Protime-INR     Status: Abnormal   Collection Time: 04/14/18  3:14 PM  Result Value Ref Range   Prothrombin Time 40.8 (H) 11.4 - 15.2 seconds    Comment: REPEATED TO VERIFY   INR 4.32 (HH)     Comment: REPEATED TO VERIFY CRITICAL RESULT CALLED TO, READ BACK BY AND VERIFIED WITH: Carmelia Bake, RN [phone] 04/14/2018 BY MACEDA,J. Performed at [facility], [place] [address], [place], [place] [address]   TSH     Status: None   Collection Time: 04/14/18  3:15 PM  Result Value Ref Range   TSH 1.117 0.350 - 4.500 uIU/mL    Comment: Performed by a 3rd Generation assay with a functional sensitivity of <=0.01 uIU/mL. Performed at [facility], [place] [address] [place], [place] [address]   Cortisol     Status: None   Collection Time: 04/14/18  3:15 PM  Result Value Ref Range   Cortisol, Plasma >100.0 ug/dL    Comment: RESULTS CONFIRMED BY MANUAL DILUTION (NOTE) AM    6.7 - 22.6 ug/dL PM   <10.0       ug/dL Performed at [facility] [address], [place], [place] [address]   Lactic acid, plasma     Status: Abnormal   Collection Time: 04/14/18  3:16 PM  Result Value Ref Range   Lactic Acid, Venous 8.3 (HH) 0.5 - 1.9 mmol/L    Comment: CRITICAL RESULT CALLED TO, READ BACK BY AND VERIFIED WITH: E CURRY,RN 1615 04/14/18 D [place] Performed at [facility] [address] [place], [place] [address]   Hepatic function panel     Status: Abnormal   Collection Time: 04/14/18  3:17 PM  Result Value Ref Range   Total Protein 3.1 (L) 6.5 - 8.1 g/dL   Albumin 1.5 (L) 3.5 - 5.0 g/dL   AST 837 (H) 15 - 41 U/L   ALT 324 (H) 0 - 44 U/L   Alkaline Phosphatase 67 38 - 126 U/L   Total Bilirubin 3.4 (H) 0.3 - 1.2 mg/dL   Bilirubin, Direct 1.4 (H) 0.0 - 0.2 mg/dL   Indirect Bilirubin 2.0 (H) 0.3 - 0.9 mg/dL    Comment: Performed at [facility] [address], [place], [place] [address]  Basic metabolic panel     Status: Abnormal   Collection Time: 04/14/18  3:17 PM  Result Value Ref Range   Sodium 150 (H) 135 - 145 mmol/L   Potassium 3.9 3.5 - 5.1 mmol/L   Chloride 126 (H) 98 - 111 mmol/L   CO2 14 (L) 22 - 32 mmol/L   Glucose, Bld 105 (H) 70 - 99 mg/dL   BUN 41 (H) 8 - 23 mg/dL   Creatinine, Ser 2.07 (H) 0.44 - 1.00 mg/dL   Calcium 5.7 (LL) 8.9 - 10.3 mg/dL  Comment: CRITICAL RESULT CALLED TO, READ BACK BY AND VERIFIED  WITH: E CURRY,RN 1615 04/14/18 D [place]    GFR calc non Af Amer 24 (L) >60 mL/min   GFR calc Af Amer 27 (L) >60 mL/min   Anion gap 10 5 - 15    Comment: Performed at [facility] [address] [place], [place] [address]  I-STAT 3, arterial blood gas (G3+)     Status: Abnormal   Collection Time: 04/14/18  3:22 PM  Result Value Ref Range   pH, Arterial 7.118 (LL) 7.350 - 7.450   pCO2 arterial 41.0 32.0 - 48.0 mmHg   pO2, Arterial 158.0 (H) 83.0 - 108.0 mmHg   Bicarbonate 14.1 (L) 20.0 - 28.0 mmol/L   TCO2 16 (L) 22 - 32 mmol/L   O2 Saturation 99.0 %   Acid-base deficit 15.0 (H) 0.0 - 2.0 mmol/L   Patient temperature 33.0 C    Sample type ARTERIAL     Portable Chest X-ray  Result Date: 04/14/2018 CLINICAL DATA:  Intubation EXAM: PORTABLE CHEST 1 VIEW COMPARISON:  Portable exam 1059 hours compared to 04/14/2018 at 1014 hours FINDINGS: Tip of endotracheal tube projects 2.0 cm above carina. LEFT subclavian central venous catheter with tip projecting over SVC. Nasogastric tube extends into stomach. Upper normal heart size. Mediastinal contours normal. BILATERAL pulmonary infiltrates greatest at bases RIGHT greater than LEFT. No definite pleural effusion or pneumothorax. IMPRESSION: Persistent pulmonary infiltrates favoring pneumonia. Line and tube positions as above without pneumothorax following central line placement. Electronically Signed   By: Lavonia Dana M.D.   On: 04/14/2018 12:45   Dg Chest Port 1 View  Result Date: 04/14/2018 CLINICAL DATA:  Confusion. EXAM: PORTABLE CHEST 1 VIEW COMPARISON:  04/13/2018 FINDINGS: The heart size and mediastinal contours are within normal limits. Lung volumes are lower with bibasilar opacities likely representing atelectasis. Early pneumonia cannot be excluded. There is no evidence of pulmonary edema, consolidation, pneumothorax, nodule or pleural fluid. The visualized skeletal structures are unremarkable. IMPRESSION: Bibasilar opacities likely  representing atelectasis. Early pneumonia cannot be excluded. Electronically Signed   By: Aletta Edouard M.D.   On: 04/14/2018 10:22   Dg Chest Port 1 View  Result Date: 04/13/2018 CLINICAL DATA:  Difficulty breathing EXAM: PORTABLE CHEST 1 VIEW COMPARISON:  04/13/2018 FINDINGS: Cardiac shadow is mildly enlarged but stable. Aortic calcifications are seen. The lungs are well aerated bilaterally with very minimal platelike atelectasis in the left base. No acute infiltrate or effusion is noted. No bony abnormality is noted. IMPRESSION: Minimal platelike atelectasis in the left base. Electronically Signed   By: Inez Catalina M.D.   On: 04/13/2018 07:59    ROS Blood pressure 127/70, pulse 96, temperature (!) 94.4 F (34.7 C), temperature source Rectal, resp. rate (!) 24, height 5\' 5"  (1.651 m), weight 54.6 kg, SpO2 100 %. Physical Exam Physical Examination: General appearance - pale, nonresponsive on vent, , emaciated Mental status - nonresponsive Eyes - funduscopic exam normal, discs flat and sharp Neck - adenopathy noted PCL Lymphatics - posterior cervical nodes, shotty L Ax adenopathy Chest - rhonchi noted scattered Heart - S1 and S2 normal, systolic murmur NT6/1 at apex Abdomen - mild distension, bs decreased Extremities - hyperpig  Below knees ? 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  Need to support and see if improves with HRS tx, bp support, control of Acidemia  2 NAGMA from D,.  Does have ^^lactate but no AG.  Suspect related to liver 3 Hypertension: predisposed to injury with ARB 4. Anemia progressive, ? GI losses 5. ^ SNa from D and ivf 6 Cirrhosis 7 ADD P CRRT, bicarb replacement,  NE, OCT, Alb. AB, vent  Jeneen Rinks Rainen [place]rossum 04/14/2018, 5:08 PM

## 2018-04-14 NOTE — Progress Notes (Signed)
Rectal temp 93-8F rectal. Lab called about CBG critical value of 22. d50 administered.Trop 0.20. Checked CBG on glucometer and it read 150. Will recheck pt sugar in 15 minutes. Md paged about patient condition.  K schorr care order: Please apply warm blankets, heat up room temp, initiate hypoglycemia protocol and retake temp in 1 hour and update provider.  Bear hugger ordered. Will continue to monitor

## 2018-04-14 NOTE — Progress Notes (Signed)
Patient has not voided since her foley was discontinued yesterday morning, bladder scan showed 90cc, patient   c/o pain in her lower abdomen,Thompson MD notified, new orders received, will monitor.

## 2018-04-14 NOTE — Progress Notes (Addendum)
cbg 13 @1029 , patient alert and confused, 50% dextrose IV  given recheck CBG 108 @1038 .

## 2018-04-14 NOTE — Progress Notes (Addendum)
PROGRESS NOTE    Teresa Schneider  PHX:[id number] DOB: May 22, 1945 DOA: 04/12/2018 PCP: Elby Showers, MD    Brief Narrative:  72 y.o.femalewith a hx of alcoholic liver disease w/ varices and ascites, depression, ADD, sleep apnea noncompliant with CPAP, and chronic iron deficiency anemia who was brought in by a friend after found to be confused and disoriented.   Work-up in the ED revealed an abnormal EKG with lateral T wave inversions and lab work showing elevated troponin, leukocytosis, elevated liver enzymes, ammonia level, CK and lactate. CT abdomen showed gallstones as well as a 3 mm CBD stone.  Patient the evening of 04/13/2018 deteriorated with hypothermia, noted to be with a worsening metabolic acidosis acute renal failure, hyponatremic with significant abdominal pain.  CT abdomen and pelvis ordered.  Lactic acid, procalcitonin also ordered as well as blood cultures x2 and UA with cultures and sensitivities.  Patient placed empirically on IV antibiotics.  Critical care medicine consulted for further evaluation and management.  Assessment & Plan:   Principal Problem:   Acute metabolic encephalopathy Active Problems:   Acute hepatic encephalopathy   Alcoholic liver disease (HCC)   Elevated troponin   Sepsis (HCC)   AMS (altered mental status)   Malnutrition of moderate degree  #1 sepsis/Hypothermia Patient noted to be hypothermic overnight, systolic blood pressures in the 90s, now with worsening metabolic acidosis with a bicarb of 10, decreased urine output, initially tachycardic, worsening renal function and worsening LFTs with increasing bilirubin.  Patient with significant diffuse abdominal pain.  Concern for worsening infectious etiology, possible cholangitis as patient noted to have multiple gallstones on CT abdomen and pelvis obtained on admission now with worsening LFTs increasing bilirubin with diffuse abdominal pain.  Check a lactic acid level, procalcitonin, random  cortisol, TSH, stat CT abdomen and pelvis, chest x-ray.  Give a 1 L normal saline bolus.  Change IV fluids to D5W at 100 cc/h as patient now hypernatremic with a sodium level of 149.  Patient however with a normal white count.  Blood cultures done on admission negative to date.  Check a UA with cultures and sensitivities.  Check blood cultures x2.  Place empirically on IV vancomycin and IV cefepime.  Consult with critical care medicine for further evaluation and management.  #2 acute metabolic encephalopathy/hepatic encephalopathy Patient presented with altered mental status and confusion.  Patient noted to have an elevated ammonia level.  Initial concern for sepsis CT head negative for any acute abnormality patient noted to be sedated on 04/12/2018 after receiving Ativan which was subsequently discontinued.  Patient started on lactulose enemas.  Ammonia levels trended down.  Patient writhling in bed and screaming in pain.  However patient's mental status has improved and answered questions appropriately.  Rectal tube was pulled out by patient per RN.  Discontinued lactulose enemas as patient now alert and placed on lactulose 30 g p.o. 3 times daily.  Continue Xifaxan.  GI following appreciate input and recommendations.  3.  Abdominal pain Patient screaming in pain and writhling in bed.  Complaining of lower abdominal pain.  Concerned that patient may have pulled on Foley catheter on 04/13/2018 as such Foley catheter was discontinued.  Patient still with significant abdominal pain however condition is worsening with a metabolic acidosis, hypothermia, low blood pressure.  Check a lactic acid level.  Check a procalcitonin level.  Check a stat CT abdomen and pelvis.  Place empirically on IV vancomycin and IV cefepime.  Discontinue Toradol.   4.  Elevated troponin Likely secondary to demand ischemia.  Patient seen in consultation by cardiology.  2D echo with normal EF with no wall motion abnormalities.  Per  cardiology patient not a candidate for any further cardiac work-up at this time.  Follow.  5.  Alcoholic liver disease with varices/ascites Likely acute alcohol associated hepatitis being investigated by GI.  Continue lactulose and Xifaxan.  Patient started on pentoxifylline for probable acute alcoholic hepatitis per GI as patient noted to have a hepatic discriminant function of greater than 32.  6. acute renal failure Likely secondary to prerenal azotemia in the setting of Toradol.  Check a fractional excretion of sodium.  Check a UA with cultures and sensitivities.  Change IV fluids to D5W.  Give a normal saline bolus.  Discontinue Toradol.  Follow.  7.  Metabolic acidosis Questionable etiology.  Patient with a anion gap of 15.  Check a lactic acid level.  Check a procalcitonin.  CT abdomen and pelvis.  Placed on empiric IV antibiotics of Vanco and cefepime.  May need to place on a bicarb drip however hydrate with IV fluids for now repeat labs this afternoon and if no significant improvement placed on a bicarb drip.  8.  Hypernatremia Change IV fluids to D5W.  Follow.  9.  Hypophosphatemia Due to poor oral intake.  Repleted.  10.  Moderate malnutrition in the context of chronic disease/underweight Nutrition following.  Currently on a full liquid diet.   DVT prophylaxis: Heparin Code Status: Full Family Communication: Updated patient.  No family at bedside. Disposition Plan: Transfer to ICU pending PCCM evaluation.    Consultants:   GI: Dr.Buccini 04/09/2018  Cardiology: Dr. Selena Batten 03/26/2018  PCCM  Procedures:   CT abdomen and pelvis 04/06/2018  CT head 04/03/2018  Chest x-ray 03/21/2018, 04/13/2018  2D echo 04/09/2018  Antimicrobials:   IV vancomycin 04/14/2018  IV cefepime 04/14/2018   Subjective: Patient writhling in bed.  Patient screaming and seems to be in significant discomfort.  Patient complained of diffuse abdominal pain.  Patient noted to be  hypothermic overnight and currently on [facility].  Patient also with minimal urine output since Foley catheter discontinuation yesterday.    Objective: Vitals:   04/14/18 0012 04/14/18 0400 04/14/18 0600 04/14/18 0822  BP: (!) 90/53 (!) 96/42  (!) 94/40  Pulse:    74  Resp:  (!) 26  17  Temp: (!) 95.8 F (35.4 C)  (!) 93.6 F (34.2 C)   TempSrc: Rectal  Rectal   SpO2:  97%  100%  Weight:      Height:        Intake/Output Summary (Last 24 hours) at 04/14/2018 0902 Last data filed at 04/14/2018 9629 Gross per 24 hour  Intake 1472.26 ml  Output 400 ml  Net 1072.26 ml   Filed Weights   04/10/18 0541 04/11/18 0355 04/13/18 0600  Weight: 50.6 kg 52.4 kg 54.6 kg    Examination:  General exam: Uncomfortable.  Writhing in bed.  Extremely dry mucous membranes.  On bear hugger. Respiratory system: Lungs clear to auscultation anterior lung fields.  No wheezing.  No crackles.  Respiratory effort normal. Cardiovascular system: RRR.  No JVD, no murmurs, no rubs.  No lower extremity edema.  Gastrointestinal system: Abdomen is nondistended, soft and diffusely tender to palpation more in the mid abdomen and lower abdominal regions.  Positive bowel sounds.  No rebound.  No guarding.  Central nervous system: Alert and oriented to self and place.. No focal neurological  deficits. Extremities: Symmetric 5 x 5 power. Skin: No rashes, lesions or ulcers Psychiatry: Judgement and insight appear fair. Mood & affect appropriate.     Data Reviewed: I have personally reviewed following labs and imaging studies  CBC: Recent Labs  Lab 04/03/2018 1358  04/10/18 0250 04/11/18 0721 04/12/18 0412 04/13/18 0345 04/14/18 0331  WBC 19.0*   < > 19.1* 22.5* 27.8* 22.8* 9.8  NEUTROABS 15.0*  --   --   --   --   --  8.2*  HGB 14.6   < > 12.5 11.0* 10.3* 10.0* 10.3*  HCT 46.3*   < > 39.0 33.6* 32.6* 31.4* 34.0*  MCV 98.7   < > 97.5 99.1 99.7 102.3* 107.9*  PLT PLATELET CLUMPS NOTED ON SMEAR, UNABLE TO  ESTIMATE   < > 112* 94* 99* 83* 97*   < > = values in this interval not displayed.   Basic Metabolic Panel: Recent Labs  Lab 04/09/18 1120 04/10/18 0250 04/11/18 0323 04/12/18 0412 04/13/18 0345 04/13/18 0346 04/14/18 0331  NA  --  141 142 142 145  --  149*  K  --  3.3* 3.3* 3.4* 3.5  --  4.4  CL  --  114* 118* 117* 122*  --  124*  CO2  --  20* 18* 19* 20*  --  10*  GLUCOSE  --  119* 101* 105* 108*  --  22*  BUN  --  32* 34* 31* 24*  --  40*  CREATININE  --  0.74 0.75 0.78 0.69  --  1.79*  CALCIUM  --  7.7* 7.5* 8.1* 7.8*  --  7.3*  MG 1.5*  --   --  2.5*  --  2.2 2.3  PHOS  --   --   --  1.2* 2.6  --   --    GFR: Estimated Creatinine Clearance: 25.2 mL/min (A) (by C-G formula based on SCr of 1.79 mg/dL (H)). Liver Function Tests: Recent Labs  Lab 04/10/18 0250 04/11/18 0323 04/12/18 0412 04/13/18 0345 04/14/18 0331  AST 70* 58* 62* 58* 135*  ALT 37 34 40 39 66*  ALKPHOS 100 89 106 106 84  BILITOT 4.2* 3.3* 3.2* 2.5* 3.2*  PROT 5.6* 4.8* 5.1* 4.7* 4.0*  ALBUMIN 2.7* 2.5* 2.6* 2.4* 2.1*   Recent Labs  Lab 04/03/2018 1358  LIPASE 29   Recent Labs  Lab 04/10/18 0250 04/11/18 0323 04/12/18 0412 04/12/18 2202 04/13/18 0345  AMMONIA 66* 49* 47* 56* 23   Coagulation Profile: Recent Labs  Lab 03/24/2018 1548 04/13/18 0345  INR 1.48 1.65   Cardiac Enzymes: Recent Labs  Lab 03/29/2018 1358 04/09/18 0810 04/09/18 1120 04/10/18 0250 04/11/18 0323  CKTOTAL 1,223* 460*  --  254* 147  TROPONINI  --   --  1.50*  --   --    BNP (last 3 results) No results for input(s): PROBNP in the last 8760 hours. HbA1C: No results for input(s): HGBA1C in the last 72 hours. CBG: Recent Labs  Lab 04/12/2018 1320 04/14/18 0628 04/14/18 0647  GLUCAP 128* 150* 102*   Lipid Profile: No results for input(s): CHOL, HDL, LDLCALC, TRIG, CHOLHDL, LDLDIRECT in the last 72 hours. Thyroid Function Tests: No results for input(s): TSH, T4TOTAL, FREET4, T3FREE, THYROIDAB in the last  72 hours. Anemia Panel: No results for input(s): VITAMINB12, FOLATE, FERRITIN, TIBC, IRON, RETICCTPCT in the last 72 hours. Sepsis Labs: Recent Labs  Lab 03/26/2018 1451 04/16/2018 1804 03/18/2018 1842 03/27/2018 2114  LATICACIDVEN 4.29*  2.9* 3.34* 2.4*    Recent Results (from the past 240 hour(s))  Blood culture (routine x 2)     Status: None   Collection Time: 04/03/2018  3:48 PM  Result Value Ref Range Status   Specimen Description BLOOD LEFT ANTECUBITAL  Final   Special Requests   Final    BOTTLES DRAWN AEROBIC AND ANAEROBIC Blood Culture adequate volume   Culture   Final    NO GROWTH 5 DAYS Performed at [facility], [address], [place], [place] [address]    Report Status 04/13/2018 FINAL  Final  Blood culture (routine x 2)     Status: None   Collection Time: 03/21/2018  3:48 PM  Result Value Ref Range Status   Specimen Description BLOOD BLOOD RIGHT HAND  Final   Special Requests   Final    BOTTLES DRAWN AEROBIC ONLY Blood Culture results may not be optimal due to an inadequate volume of blood received in culture bottles   Culture   Final    NO GROWTH 5 DAYS Performed at [facility], [place] [address] [place], [place] [address]    Report Status 04/13/2018 FINAL  Final         Radiology Studies: Dg Chest Port 1 View  Result Date: 04/13/2018 CLINICAL DATA:  Difficulty breathing EXAM: PORTABLE CHEST 1 VIEW COMPARISON:  04/07/2018 FINDINGS: Cardiac shadow is mildly enlarged but stable. Aortic calcifications are seen. The lungs are well aerated bilaterally with very minimal platelike atelectasis in the left base. No acute infiltrate or effusion is noted. No bony abnormality is noted. IMPRESSION: Minimal platelike atelectasis in the left base. Electronically Signed   By: Inez Catalina M.D.   On: 04/13/2018 07:59        Scheduled Meds: . chlorhexidine  15 mL Mouth Rinse BID  . dextroamphetamine  10 mg Oral QID  . feeding supplement (ENSURE ENLIVE)  237 mL Oral  BID BM  . folic acid  1 mg Intravenous Daily  . heparin  5,000 Units Subcutaneous Q8H  . lactulose  30 g Oral TID  . mouth rinse  15 mL Mouth Rinse q12n4p  . pentoxifylline  400 mg Oral TID WC  . rifaximin  550 mg Oral BID  . thiamine injection  100 mg Intravenous Daily   Continuous Infusions: . dextrose 100 mL/hr at 04/14/18 0852     LOS: 6 days    Time spent: 50 minutes    Irine Seal, MD [facility] Pager [phone]  If 7PM-7AM, please contact night-coverage [email or web address] Password [email or web address] 04/14/2018, 9:02 AM

## 2018-04-14 NOTE — Procedures (Signed)
Intubation Procedure Note  RUNELL KOVICH  [id number] Dec 10, 1945   Procedure: Intubation Indications: Respiratory insufficiency  Procedure Details Consent: Unable to obtain consent because of emergent medical necessity. Time Out: Verified patient identification, verified procedure, site/side was marked, verified correct patient position, special equipment/implants available, medications/allergies/relevent history reviewed, required imaging and test results available.  Performed  Pre-oxygenation: 100% via BMV for 94mn Premedication: Versed 280mand fentanyl 5022mPosition: supine Induction agent: Etomidate 1m65mralytic: Succinylcholine 60mg87mhnique: Direct with MAC 3 - failed. Second attempt with Glidescope 4. Tube size: 7.5 Laryngoscopy view: 2 Number of attempts: 2 Insertion depth: 23cm Tube secured: tube holder Other findings: DIFFICULT AIRWAY with very anterior larynx and small laryngeal opening.    OGT/NGT inserted: yes  Position confirmed by auscultation: yes  Evaluation Colorimetric change: + Bilateral breath sounds: + Hemodynamic Status: Persistent hypotension treated with pressors and fluid; O2 sats: transiently fell during during procedure and currently acceptable Patient's Current Condition: stable Complications: No apparent complications Patient did tolerate procedure well. Chest X-ray ordered to verify placement.  CXR: tube position acceptable.   Dailee Manalang Einar Gradwala 03/30/2018

## 2018-04-14 NOTE — Progress Notes (Signed)
RT NOTE: RT transported patient with RN from [place] to CT and back with no complications. RT will continue to monitor.

## 2018-04-14 NOTE — Procedures (Signed)
Central Venous Catheter Insertion Procedure Note TUWANNA KRAUSZ [id number] 1945/08/25  Procedure: Insertion of Central Venous Catheter Indications: Assessment of intravascular volume, Drug and/or fluid administration and Frequent blood sampling  Procedure Details Consent: Unable to obtain consent because of emergent medical necessity. Time Out: Verified patient identification, verified procedure, site/side was marked, verified correct patient position, special equipment/implants available, medications/allergies/relevent history reviewed, required imaging and test results available.  Performed  Maximum sterile technique was used including antiseptics, cap, gloves, gown, hand hygiene, mask and sheet. Skin prep: Chlorhexidine; local anesthetic administered A antimicrobial bonded/coated 42F 20cm  triple lumen catheter was placed in the left subclavian vein using the Seldinger technique to a depth of 16cm under ultrasound guidance.       Evaluation Blood flow good Complications: No apparent complications Patient did tolerate procedure well. Chest X-ray ordered to verify placement.  CXR: tip terminated at cavo-inominate junction. no pneumothorax.  Martasia Talamante 04/14/2018, 12:37 PM

## 2018-04-14 NOTE — Progress Notes (Signed)
RN called for assistance with transferring pt to ICU. There were no beds available. Pt unstable, remained with pt until bed became available on 2h10, transferred approx [phone]

## 2018-04-14 NOTE — Progress Notes (Signed)
Pharmacy Antibiotic Note  Teresa Schneider is a 72 y.o. female with possible sepsis. Pharmacy has been consulted for cefepime and vancomycin dosing. AKI and hypotension - Renal consulted and starting CRRT. Vancomycin dose remains appropriate for CRRT dosing. Will adjust cefepime dose for CRRT. Noted rash allergy to PCN, but patient tolerated first dose of cefepime today.  Vancomycin 1g IV load previously ordered - not yet given.  Plan: -Give previously ordered vancomycin 1g IV x 1; then continue vancomycin 500mg  (~10mg /kg) IV q24h -Change cefepime to 2g IV q12h for CRRT dosing -Monitor clinical progress, c/s, abx plan/LOT -Vancomycin level at steady state and as indicated -F/u Renal plans   Height: 5\' 5"  (165.1 cm)(est. non-verbal) Weight: 120 lb 5.9 oz (54.6 kg) IBW/kg (Calculated) : 57  Temp (24hrs), Avg:93.2 F (34 C), Min:91.8 F (33.2 C), Max:97.6 F (36.4 C)  Recent Labs  Lab 04/03/2018 1451 04/06/2018 1804 03/25/2018 1842 03/22/2018 2114  04/10/18 0250  04/11/18 0721 04/12/18 0412 04/13/18 0345 04/14/18 0331 04/14/18 1104 04/14/18 1516 04/14/18 1517  WBC  --   --   --   --    < > 19.1*  --  22.5* 27.8* 22.8* 9.8  --   --   --   CREATININE  --   --   --   --    < > 0.74   < >  --  0.78 0.69 1.79* 2.00*  --  2.07*  LATICACIDVEN 4.29* 2.9* 3.34* 2.4*  --   --   --   --   --   --   --   --  8.3*  --    < > = values in this interval not displayed.    Estimated Creatinine Clearance: 21.8 mL/min (A) (by C-G formula based on SCr of 2.07 mg/dL (H)).    Allergies  Allergen Reactions  . Salmon [Fish Allergy] Dermatitis  . Other Dermatitis    Paprika  . Sulfamethoxazole-Trimethoprim     REACTION: rash  . Penicillins Rash  . Pyridium [Phenazopyridine Hcl] Rash    Antimicrobials this admission: 11/28 vanc>> 11/28 cefepime>>  Dose adjustments this admission: 11/28 - dose adjust cefepime for CRRT  Microbiology results: 11/28 BCx - pending 11/27 and 11/28 urine -  neg 11/22 blood x2- neg  Elicia Lamp, PharmD, BCPS Clinical Pharmacist Clinical phone [phone] Please check AMION for all [place] contact numbers 04/14/2018 5:54 PM

## 2018-04-14 NOTE — Progress Notes (Signed)
   Call from radiology   - GI perforation and ET tube in RMB  Plan -= retract ET tube - - d/w Dr Lynetta Mare - CCM doc who saw patient - he will address with patient family     SIGNATURE    Dr. Brand Males, M.D., F.C.C.P,  Pulmonary and Critical Care Medicine Staff Physician, [place] Director - Interstitial Lung Disease  Program  Pulmonary [place] [facility] [place], [place], [address]  Pager: [phone], If no answer or between  15:00h - 7:00h: call [phone] Telephone: [phone]  7:03 PM 04/14/2018

## 2018-04-14 NOTE — Procedures (Signed)
Arterial Catheter Insertion Procedure Note Teresa Schneider [id number] 05/05/1946  Procedure: Insertion of Arterial Catheter  Indications: Blood pressure monitoring and Frequent blood sampling  Procedure Details Consent: Unable to obtain consent because of altered level of consciousness. Time Out: Verified patient identification, verified procedure, site/side was marked, verified correct patient position, special equipment/implants available, medications/allergies/relevent history reviewed, required imaging and test results available.  Performed  Maximum sterile technique was used including antiseptics, cap, gloves, gown, hand hygiene, mask and sheet. Skin prep: Chlorhexidine; local anesthetic administered 20 gauge catheter was inserted into left radial artery using the Seldinger technique. ULTRASOUND GUIDANCE USED: NO Evaluation Blood flow good; BP tracing good. Complications: No apparent complications.   Teresa Schneider 04/14/2018

## 2018-04-14 NOTE — Consult Note (Signed)
NAME:  Teresa Schneider, MRN:  [id number], DOB:  1946-02-02, LOS: 6 ADMISSION DATE:  04/01/2018, CONSULTATION DATE:  11/28 REFERRING MD:  Grandville Silos, CHIEF COMPLAINT:  Circulatory shock w/ worsening renal failure and metabolic acidosis    Brief History   72 year old female admitted 11/22 initially w/ acute ETOH related hepatitis, + several gal stones and stone in CBD (LFTs felt more c/w Acute ETOH hepatitis more than Cholangitis). PCCM asked to see 11/28 w/ new onset abd pain, shock, renal failure and AG metabolic acidosis  History of present illness   72 year old female w/ h/o heavy ETOH. Admitted 11/22 w/ new delirium felt initially 2/2 hepatic encephalopathy. Treated supportively. Also found to have elevated LFTs, gallstones and stone in CBD. LFT derangements felt more likely to be acute ETOH related hepatitis than cholangitis. Mental status began to worsen on 11/25 felt 2/2 onset of ETOH w/d. On 11/28 developed worsening abd pain, new shock (SBP 70s), renal failure and Anion gap metabolic acidosis.   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 Depression, ADD, OSA (not CPAP). Malnutrition  Significant Hospital Events   11/22 admitted after being brought to ED by friend for confusion and disorientation. In ED had mild Trop I elevation, elevated LFTs, ammonia, and CK levels. CT abd showed gallstones w/ 63mm CBD stone as well. , cultures sent. IV heparin started. thiamin and folate  11/23 seen by GI. Felt findings c/w ETOH related rather than cholangitis (unless low grade) 11/25 more awake; Lactulose,  and xifaxian started, more agitated. Felt likely going into ETOH w/d 11/26: got ativan and was more lethargic, started on lactulose enema  11/27 started on Pentoxifylline for ETOH related hepatitis  11/28: temp down to 93 CBG 22, BP 42V systolic w/ new abd pain, AKI, Anion gap metabolic acidosis and rising LFTs. PCCM called Consults:  GI PCCM  Procedures:     Significant Diagnostic Tests:  11/22: CT abd: 1. 3 mm calculus in the distal common bile duct without significant pancreatic or extrahepatic biliary dilatation.  Numerous gallstones are noted within the physiologically distended gallbladder.Surface nodularity of the liver consistent with morphologic changes of cirrhosis. Tiny too small to further characterize hypodensities in the left and right hepatic lobes but statistically more likely to represent cysts or hemangiomata. 3. Chronic mild esophageal thickening along the distal aspect. Esophagitis is not excluded. 4. Small paraesophageal and periportal varices. 5. Aortoiliac and branch vessel atherosclerosis. 6. Chronic moderate T11 and mild T12 compression deformities. 11/22: CT head: No acute abnormality. 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 11/28 CT abd/pelvis:>>> Micro Data:  11/22 BCX2: neg 11/28 UC>>> 11/28 BCX2>>>  Antimicrobials:  Cefepime 11/28>>> vanc 11/28>>>  Interim history/subjective:    Objective   Blood pressure (Abnormal) 94/40, pulse 74, temperature (Abnormal) 94.4 F (34.7 C), temperature source Rectal, resp. rate 17, height 5\' 5"  (1.651 m), weight 54.6 kg, SpO2 100 %.        Intake/Output Summary (Last 24 hours) at 04/14/2018 0927 Last data filed at 04/14/2018 9563 Gross per 24 hour  Intake 1472.26 ml  Output 400 ml  Net 1072.26 ml   Filed Weights   04/10/18 0541 04/11/18 0355 04/13/18 0600  Weight: 50.6 kg 52.4 kg 54.6 kg    Examination: General: 72 year old chronically ill appearing white female. Confused and agitated  HENT: NCAT sclera icteric MM dry  Lungs: decreased bases  Cardiovascular: Regular no MRG Abdomen: firm, shifting dullness. Pain to palp Extremities: no  edema brisk CR scattered areas of ecchymosis Neuro: awake, oriented X 1 moves all extremities  GU: voids   Resolved Hospital Problem list   Troponin elevation (seen by cards; completed heparin 48 hrs;  deemed not cath candidate)  Assessment & Plan:  Circulatory shock. Favor sepsis/septic shock w/ abd source. ? SBP; also consider cholangitis (could this have been smoldering?)  Plan 46ml/kg crystalloids now LA now and then repeat after fluid Will likely need central line given poor vascular access  Norepinephrine for MAP > 65 Culture blood and urine [place] abd->dx paracentesis if has ascites Repeat abd/pelvis ordered.  Day 1 vanc and cefepime   New Anion Gap metabolic acidosis and NAG metabolic acidosis .  -hyperchloremia and Bicarb loss from diarrhea  Plan Check lactic acid Stat ABG Starting Bicarb gtt  Serial chemistries   Acute Metabolic and hepatic encephalopathy; likely also c/b ETOH related w/d Plan Cont thiamine and folate Cont lactulose and rafaximin May need precedex  Supportive care Avoid Benzos w/ Cirrhosis   AKI Plan Renal dose meds Volume resuscitation Strict Intake and output Foley cath  Fluid and electrolyte imbalance: hypernatremia, hyperchloremia  Plan Gentle free water replacement Serial chemistry   Hypoglycemia Plan Serial CBGs Dextrose in bicarb solution Ck cortisol   Acute ETOH hepatitis superimposed on ETOH related Cirrhosis w/ ascites and varices; Also has gall stones and stone in CBD (GI felt LFT elevation more c/w alcoholic hepatitis over cholangitis) -LFTs elevated again->? D/t shock  Plan GI following  Repeating CT abd/pelvis NPO except meds (May need small bore NGT) Further recs per GI  Protein cal malnutrition Plan NPO for now   Anemia of chronic disease, mild thrombocytopenia Plan Trend CBC  Best practice:  Diet: NPO Pain/Anxiety/Delirium protocol (if indicated): NA but may need precedex VAP protocol (if indicated): NA DVT prophylaxis: [place] heparin  GI prophylaxis: PPI Glucose control: NA Mobility: BR Code Status: Full code  Family Communication: none Disposition: critically ill in shock. Will need transfer to ICU,  repeat CT imaging. Likely central access. Awaiting ABG. May need intubation depending on clinical course but currently protecting airway   Labs   CBC: Recent Labs  Lab 03/29/2018 1358  04/10/18 0250 04/11/18 0721 04/12/18 0412 04/13/18 0345 04/14/18 0331  WBC 19.0*   < > 19.1* 22.5* 27.8* 22.8* 9.8  NEUTROABS 15.0*  --   --   --   --   --  8.2*  HGB 14.6   < > 12.5 11.0* 10.3* 10.0* 10.3*  HCT 46.3*   < > 39.0 33.6* 32.6* 31.4* 34.0*  MCV 98.7   < > 97.5 99.1 99.7 102.3* 107.9*  PLT PLATELET CLUMPS NOTED ON SMEAR, UNABLE TO ESTIMATE   < > 112* 94* 99* 83* 97*   < > = values in this interval not displayed.    Basic Metabolic Panel: Recent Labs  Lab 04/09/18 1120 04/10/18 0250 04/11/18 0323 04/12/18 0412 04/13/18 0345 04/13/18 0346 04/14/18 0331  NA  --  141 142 142 145  --  149*  K  --  3.3* 3.3* 3.4* 3.5  --  4.4  CL  --  114* 118* 117* 122*  --  124*  CO2  --  20* 18* 19* 20*  --  10*  GLUCOSE  --  119* 101* 105* 108*  --  22*  BUN  --  32* 34* 31* 24*  --  40*  CREATININE  --  0.74 0.75 0.78 0.69  --  1.79*  CALCIUM  --  7.7* 7.5* 8.1* 7.8*  --  7.3*  MG 1.5*  --   --  2.5*  --  2.2 2.3  PHOS  --   --   --  1.2* 2.6  --   --    GFR: Estimated Creatinine Clearance: 25.2 mL/min (A) (by C-G formula based on SCr of 1.79 mg/dL (H)). Recent Labs  Lab 03/30/2018 1451 03/31/2018 1804 03/31/2018 1842 04/01/2018 2114  04/11/18 0721 04/12/18 0412 04/13/18 0345 04/14/18 0331  WBC  --   --   --   --    < > 22.5* 27.8* 22.8* 9.8  LATICACIDVEN 4.29* 2.9* 3.34* 2.4*  --   --   --   --   --    < > = values in this interval not displayed.    Liver Function Tests: Recent Labs  Lab 04/10/18 0250 04/11/18 0323 04/12/18 0412 04/13/18 0345 04/14/18 0331  AST 70* 58* 62* 58* 135*  ALT 37 34 40 39 66*  ALKPHOS 100 89 106 106 84  BILITOT 4.2* 3.3* 3.2* 2.5* 3.2*  PROT 5.6* 4.8* 5.1* 4.7* 4.0*  ALBUMIN 2.7* 2.5* 2.6* 2.4* 2.1*   Recent Labs  Lab 04/16/2018 1358  LIPASE 29    Recent Labs  Lab 04/10/18 0250 04/11/18 0323 04/12/18 0412 04/12/18 2202 04/13/18 0345  AMMONIA 66* 49* 47* 56* 23    ABG No results found for: PHART, PCO2ART, PO2ART, HCO3, TCO2, ACIDBASEDEF, O2SAT   Coagulation Profile: Recent Labs  Lab 04/14/2018 1548 04/13/18 0345  INR 1.48 1.65    Cardiac Enzymes: Recent Labs  Lab 03/23/2018 1358 04/09/18 0810 04/09/18 1120 04/10/18 0250 04/11/18 0323  CKTOTAL 1,223* 460*  --  254* 147  TROPONINI  --   --  1.50*  --   --     HbA1C: No results found for: HGBA1C  CBG: Recent Labs  Lab 03/21/2018 1320 04/14/18 0628 04/14/18 0647  GLUCAP 128* 150* 102*    Review of Systems:   na  Past Medical History  She,  has a past medical history of ADD (attention deficit disorder), Alcoholic hepatitis, Anemia, Ascites, Depression, Palpitations, and Sleep apnea.   Surgical History    Past Surgical History:  Procedure Laterality Date  . COLONOSCOPY    . FRACTURE SURGERY Right    wrist  . JOINT REPLACEMENT Right    hip  . MASTECTOMY Bilateral    2 different times     Social History   reports that she has been smoking cigarettes. She has a 26.25 pack-year smoking history. She has never used smokeless tobacco. She reports that she drinks alcohol. She reports that she does not use drugs.   Family History   Her family history includes Cancer in her father; Depression in her mother; Glaucoma in her mother; Suicidality in her sister.   Allergies Allergies  Allergen Reactions  . Salmon [Fish Allergy] Dermatitis  . Other Dermatitis    Paprika  . Sulfamethoxazole-Trimethoprim     REACTION: rash  . Penicillins Rash  . Pyridium [Phenazopyridine Hcl] Rash     Home Medications  Prior to Admission medications   Medication Sig Start Date End Date Taking? Authorizing Provider  celecoxib (CELEBREX) 200 MG capsule Take 1 capsule (200 mg total) by mouth daily. 11/30/17  Yes Baxley, Cresenciano Lick, MD  dextroamphetamine (DEXTROSTAT) 10 MG  tablet Take 10 mg by mouth 4 (four) times daily. 02/22/18  Yes [provider]  furosemide (LASIX) 20 MG tablet Take 1 tablet by mouth  every other day 11/30/17  Yes Baxley, Cresenciano Lick, MD  ibandronate (BONIVA) 150 MG tablet One po monthly on empty stomach. Patient taking differently: Take 150 mg by mouth every 30 (thirty) days.  09/30/17  Yes Baxley, Cresenciano Lick, MD  losartan (COZAAR) 25 MG tablet TAKE 1 TABLET EVERY DAY Patient taking differently: Take 25 mg by mouth daily.  11/08/17  Yes Baxley, Cresenciano Lick, MD  rosuvastatin (CRESTOR) 5 MG tablet TAKE 1 TABLET EVERY DAY Patient taking differently: Take 5 mg by mouth daily.  02/16/18  Yes Baxley, Cresenciano Lick, MD  tiZANidine (ZANAFLEX) 4 MG tablet Take 12 mg by mouth at bedtime.  03/09/18  Yes [provider]  aspirin EC 81 MG tablet Take 81 mg by mouth daily.    [provider]  Calcium Carbonate-Vit D-Min (CALCIUM 1200 PO) Take 1,200 mg by mouth daily.    [provider]  Multiple Vitamins-Minerals (CENTRUM SILVER 50+WOMEN PO) Take by mouth.    [provider]  Vitamin D, Ergocalciferol, (DRISDOL) 50000 units CAPS capsule Take 50,000 Units by mouth every 7 (seven) days.    [provider]     Critical care time: 55 min     Erick Colace ACNP-BC [facility] Pager # [phone] OR # [phone] if no answer

## 2018-04-17 NOTE — Progress Notes (Signed)
137ml of Fentanyl and 7ml of Morphine wasted in the stericycle. RN Ericka Pontiff witnessed waste.

## 2018-04-17 NOTE — Progress Notes (Signed)
Family at bedside. Pt noted to to be in asystole on the monitor. 2 RN assessment performed(Vihaan Gloss RN/ Facilities manager). No heart or lung sounds noted. Pt pronounced April 17, 2018 12:09am

## 2018-04-17 NOTE — Procedures (Signed)
Central Venous Catheter Insertion Procedure Note ZENDA HERSKOWITZ [id number] 1945/12/03  Procedure: Insertion of Central Venous Catheter Indications: Hemodialysis  Procedure Details Consent: Unable to obtain consent because of emergent medical necessity. Time Out: Verified patient identification, verified procedure, site/side was marked, verified correct patient position, special equipment/implants available, medications/allergies/relevent history reviewed, required imaging and test results available.  Performed  Maximum sterile technique was used including antiseptics, cap, gloves, gown, hand hygiene, mask and sheet. Skin prep: Chlorhexidine; local anesthetic administered A antimicrobial bonded/coated double lumen catheter was placed in the right femoral vein due to patient being a dialysis patient using the Seldinger technique. 84F 20cm Trialysis catheter inserted to 18cm under ultrasound guidance.        Evaluation Blood flow good Complications: No apparent complications Patient did tolerate procedure well. Chest X-ray ordered to verify placement.  CXR: N/A.  Tyriq Moragne 12-May-2018, 1:14 AM

## 2018-04-17 DEATH — deceased

## 2018-04-18 ENCOUNTER — Telehealth: Payer: Self-pay

## 2018-04-18 NOTE — Telephone Encounter (Signed)
On 04/18/18 I received a d/c from [employer] (original). The dc is for cremation. The patient is a patient of Doctor Agarwala.   DC will be taken 2 heart for signature.  On 04/19/18 I received the dc back from Doctor Agarwala. I got the d/c ready and called the funeral home to let them know the d/c is ready for pickup.

## 2018-04-19 LAB — CULTURE, BLOOD (ROUTINE X 2): CULTURE: NO GROWTH

## 2018-04-21 LAB — CULTURE, BLOOD (ROUTINE X 2): Special Requests: ADEQUATE

## 2018-05-18 NOTE — Accreditation Note (Signed)
Restraints reported to CMS  Pursuant to regulation 482.13 (G) (3) use of restraints was logged and CMS was notified via email on 04/18/2018

## 2018-05-18 NOTE — Death Summary Note (Signed)
DEATH SUMMARY   Patient Details  Name: Teresa Schneider MRN: [id number] DOB: [birth year]  Admission/Discharge Information   Admit Date:  Apr 19, 2018  Date of Death: Date of Death: 26-Apr-2018  Time of Death: Time of Death: 08-05-22  Length of Stay: 7  Referring Physician: Elby Showers, MD   Reason(s) for Hospitalization  Altered mental status.  Diagnoses  Preliminary cause of death:   Septic shock Bowel perforation Cholangitis Secondary Diagnoses (including complications and co-morbidities):  Principal Problem:   Sepsis ([place]) Active Problems:   Alcoholic liver disease (H[facility])   Elevated troponin   AMS (altered mental status)   Malnutrition of moderate degree   Acute metabolic encephalopathy   Acute hepatic encephalopathy   Hypothermia   Acidosis   Brief Hospital Course (including significant findings, care, treatment, and services provided and events leading to death)  Teresa Schneider is a 73 y.o. year old female who has a history of alcoholism with cirrhosis and prior varices.  She was originally admitted for hepatic encephalopathy and evidence of possible cholangitis.  She was eventually admitted to the ICU for worsening hypotension.  She was eventually intubated and started on vasopressors given fluids and started on antibiotics.  A CT scan revealed all perforation.  The situation was discussed with her nephew who was her substitute decision maker and it was decided that the patient would not want further aggressive care at this point.  She was transitioned to comfort care and passed away peacefully at 0 09 hours.    Pertinent Labs and Studies  Significant Diagnostic Studies Ct Abdomen Pelvis Wo Contrast  Result Date: 04/14/2018 CLINICAL DATA:  Metabolic encephalopathy EXAM: CT ABDOMEN AND PELVIS WITHOUT CONTRAST TECHNIQUE: Multidetector CT imaging of the abdomen and pelvis was performed following the standard protocol without IV contrast. COMPARISON:  CT 19-Apr-2018  FINDINGS: Lower chest: Interim development of small right greater than left pleural effusion. Right greater than left lower lobe consolidations are also new. Coronary vascular calcification. Cardiomegaly. Trace pericardial effusion. Tip of the endotracheal tube overlies the orifice of the right mainstem bronchus. Hepatobiliary: Cirrhotic morphology of the liver. Numerous calcified gallstones. Subcentimeter hypodensities within the liver are better seen on the contrast-enhanced study. 2 mm possible distal common bile duct stone. No significant biliary dilatation Pancreas: Unremarkable. No pancreatic ductal dilatation or surrounding inflammatory changes. Spleen: Normal in size without focal abnormality. Adrenals/Urinary Tract: Adrenal glands are within normal limits. No hydronephrosis. Foley catheter and air within the urinary bladder which is mostly obscured by artifact. Stomach/Bowel: Majority of enteral contrast within the stomach. Esophageal tube looped in the stomach with tip against the anterior wall of the stomach. No dilated small bowel. Diffuse colon wall thickening, new from prior. Appendix not well visualized. Vascular/Lymphatic: Marked aortic atherosclerosis. No aneurysm. Tortuous collateral vessels anterior to the left psoas. No significantly enlarged lymph nodes. Reproductive: Uterus is not well seen, likely due to development of ascites and extensive streak artifact from right hip metallic hardware. Other: Interim development of moderate volume of free air, mostly within the anterior upper abdomen. Potential source may be anterior wall of the gastric body where there is small suspected extraluminal gas bubble and streaky high density coursing toward the large extraluminal gas collection within the anterior abdomen, sagittal series 7, image number 90. Development of moderate amount of abdominal and pelvic free fluid. Musculoskeletal: Status post right hip arthroplasty with artifact. Multiple compression  fractures at T6, T7, T8, T11 and T12. IMPRESSION: 1. Interim development of moderate free intraperitoneal  air and moderate to large volume of ascites throughout the abdomen consistent with hollow viscus perforation. Potential source may be anterior wall of the mid to distal stomach where there is suggestion of small extraluminal gas bubble and thin stream of contrast coursing toward the extraluminal gas collection within the anterior abdomen. 2. Development of small right greater than left pleural effusions with bibasilar consolidations which may reflect atelectasis or pneumonia. Of note, endotracheal tube tip encroaches the proximal right mainstem bronchus. 3. Cirrhosis of the liver. Multiple gallstones with probable 2 mm distal bile duct stone but no biliary dilatation 4. Multiple thoracic compression fractures. Critical Value/emergent results were called by telephone at the time of interpretation on 04/14/2018 at 7:01 pm to Dr. Chase Caller , who verbally acknowledged these results. Electronically Signed   By: Donavan Foil M.D.   On: 04/14/2018 19:02   Ct Head Wo Contrast  Result Date: 03/23/2018 CLINICAL DATA:  Altered mental status. EXAM: CT HEAD WITHOUT CONTRAST TECHNIQUE: Contiguous axial images were obtained from the base of the skull through the vertex without intravenous contrast. COMPARISON:  None. FINDINGS: Brain: Normal appearing cerebral hemispheres and posterior fossa structures. Normal size and position of the ventricles. No intracranial hemorrhage, mass lesion or CT evidence of acute infarction. Vascular: No hyperdense vessel or unexpected calcification. Skull: Normal. Negative for fracture or focal lesion. Sinuses/Orbits: Status post bilateral cataract extraction. Unremarkable paranasal sinuses. 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 IMPRESSION: No acute abnormality. 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 Electronically  Signed   By: Claudie Revering M.D.   On: 04/10/2018 14:01   Ct Abdomen Pelvis W Contrast  Result Date: 03/20/2018 CLINICAL DATA:  Abdominal distention, altered mentation and lethargy. History of alcoholism. EXAM: CT ABDOMEN AND PELVIS WITH CONTRAST TECHNIQUE: Multidetector CT imaging of the abdomen and pelvis was performed using the standard protocol following bolus administration of intravenous contrast. CONTRAST:  160mL OMNIPAQUE IOHEXOL 300 MG/ML  SOLN COMPARISON:  07/11/2007 FINDINGS: Lower chest: Cardiomegaly without pericardial effusion or thickening. Bibasilar dependent atelectasis is noted. Further assessment is somewhat limited by respiratory motion artifacts. Small paraesophageal varices with thickened distal esophagus redemonstrated. Hepatobiliary: Surface nodularity of the liver consistent with morphologic changes of cirrhosis. Tiny too small to characterize hypodensities are noted within the left and right hepatic lobes too small to further characterize but may reflect cysts or hemangiomata. Gallbladder is non thickened but contains layering dependent calculi. There is a 3 mm calculus also noted in the distal common bile duct, series 3/28 without significant pancreatic nor extrahepatic biliary dilatation noted. Correlate with liver function tests there has been some interval development portal varices. Pancreas: Atrophic pancreas without inflammation. Spleen: The spleen measures 12.1 x 4.8 x 9.7 cm (volume = 290 cm^3), normal in size and is without focal mass. Adrenals/Urinary Tract: Normal bilateral adrenal glands and kidneys. No nephrolithiasis, renal mass nor obstructive uropathy. The urinary bladder is partially obscured by the patient's right hip arthroplasty streak artifacts. No focal mural thickening, calculus or mass is identified of the bladder. Stomach/Bowel: Decompressed stomach. No small nor large bowel obstruction or inflammation. The appendix is not confidently identified but no pericecal  inflammation is seen. Vascular/Lymphatic: Aortoiliac and branch vessel atherosclerosis without aneurysm or adenopathy. Reproductive: Atrophic uterus. No adnexal mass. Mild engorgement of the left gonadal vein. Other: No ascites or free air. Musculoskeletal: Right hip arthroplasty. Chronic moderate T11 and mild T12 compression deformities. No acute osseous abnormality. IMPRESSION: 1. 3 mm calculus in the distal common bile  duct without significant pancreatic or extrahepatic biliary dilatation. Correlate with liver function tests. Numerous gallstones are noted within the physiologically distended gallbladder. 2. Surface nodularity of the liver consistent with morphologic changes of cirrhosis. Tiny too small to further characterize hypodensities in the left and right hepatic lobes but statistically more likely to represent cysts or hemangiomata. 3. Chronic mild esophageal thickening along the distal aspect. Esophagitis is not excluded. 4. Small paraesophageal and periportal varices. 5. Aortoiliac and branch vessel atherosclerosis. 6. Chronic moderate T11 and mild T12 compression deformities. Electronically Signed   By: Ashley Royalty M.D.   On: 03/22/2018 15:54   Portable Chest X-ray  Result Date: 04/14/2018 CLINICAL DATA:  Intubation EXAM: PORTABLE CHEST 1 VIEW COMPARISON:  Portable exam 1059 hours compared to 04/14/2018 at 1014 hours FINDINGS: Tip of endotracheal tube projects 2.0 cm above carina. LEFT subclavian central venous catheter with tip projecting over SVC. Nasogastric tube extends into stomach. Upper normal heart size. Mediastinal contours normal. BILATERAL pulmonary infiltrates greatest at bases RIGHT greater than LEFT. No definite pleural effusion or pneumothorax. IMPRESSION: Persistent pulmonary infiltrates favoring pneumonia. Line and tube positions as above without pneumothorax following central line placement. Electronically Signed   By: Lavonia Dana M.D.   On: 04/14/2018 12:45   Dg Chest Port 1  View  Result Date: 04/14/2018 CLINICAL DATA:  Confusion. EXAM: PORTABLE CHEST 1 VIEW COMPARISON:  04/13/2018 FINDINGS: The heart size and mediastinal contours are within normal limits. Lung volumes are lower with bibasilar opacities likely representing atelectasis. Early pneumonia cannot be excluded. There is no evidence of pulmonary edema, consolidation, pneumothorax, nodule or pleural fluid. The visualized skeletal structures are unremarkable. IMPRESSION: Bibasilar opacities likely representing atelectasis. Early pneumonia cannot be excluded. Electronically Signed   By: Aletta Edouard M.D.   On: 04/14/2018 10:22   Dg Chest Port 1 View  Result Date: 04/13/2018 CLINICAL DATA:  Difficulty breathing EXAM: PORTABLE CHEST 1 VIEW COMPARISON:  04/05/2018 FINDINGS: Cardiac shadow is mildly enlarged but stable. Aortic calcifications are seen. The lungs are well aerated bilaterally with very minimal platelike atelectasis in the left base. No acute infiltrate or effusion is noted. No bony abnormality is noted. IMPRESSION: Minimal platelike atelectasis in the left base. Electronically Signed   By: Inez Catalina M.D.   On: 04/13/2018 07:59   Dg Chest Port 1 View  Result Date: 03/27/2018 CLINICAL DATA:  Altered mental status. EXAM: PORTABLE CHEST 1 VIEW COMPARISON:  10/30/2015 and prior radiographs FINDINGS: The cardiomediastinal silhouette is unremarkable. There is no evidence of focal airspace disease, pulmonary edema, suspicious pulmonary nodule/mass, pleural effusion, or pneumothorax. No acute bony abnormalities are identified. Chronic changes of the RIGHT humeral head again noted. IMPRESSION: No active disease. Electronically Signed   By: Margarette Canada M.D.   On: 03/24/2018 13:54    Microbiology Recent Results (from the past 240 hour(s))  Urine Culture     Status: None   Collection Time: 04/13/18 10:17 AM  Result Value Ref Range Status   Specimen Description URINE, RANDOM  Final   Special Requests NONE   Final   Culture   Final    NO GROWTH Performed at [facility], [address] [place], [place] [address]    Report Status 04/14/2018 FINAL  Final  Urine Culture     Status: None   Collection Time: 04/14/18  7:47 AM  Result Value Ref Range Status   Specimen Description URINE, RANDOM  Final   Special Requests NONE  Final  Culture   Final    NO GROWTH Performed at [facility], [place] [address] [place], [place] [address]    Report Status 04/14/2018 FINAL  Final  Culture, blood (Routine X 2) w Reflex to ID Panel     Status: None (Preliminary result)   Collection Time: 04/14/18 12:54 PM  Result Value Ref Range Status   Specimen Description BLOOD FOOT  Final   Special Requests   Final    BOTTLES DRAWN AEROBIC ONLY Blood Culture adequate volume   Culture  Setup Time   Final    YEAST AEROBIC BOTTLE ONLY PATIENT DISCHARGED OR EXPIRED PATIENT DECEASED Performed at [facility], [place] [address] [place], [place] [address]    Culture YEAST  Final   Report Status PENDING  Incomplete  Culture, blood (Routine X 2) w Reflex to ID Panel     Status: None (Preliminary result)   Collection Time: 04/14/18 12:54 PM  Result Value Ref Range Status   Specimen Description BLOOD FOOT  Final   Special Requests   Final    BOTTLES DRAWN AEROBIC ONLY Blood Culture results may not be optimal due to an inadequate volume of blood received in culture bottles   Culture   Final    NO GROWTH 4 DAYS Performed at [facility], [place] [address], [place], [place] [address]    Report Status PENDING  Incomplete    Lab Basic Metabolic Panel: Recent Labs  Lab 04/13/18 0345 04/13/18 0346 04/14/18 0331 04/14/18 1104 04/14/18 1517  NA 145  --  149* 151* 150*  K 3.5  --  4.4 3.7 3.9  CL 122*  --  124* 122* 126*  CO2 20*  --  10*  --  14*  GLUCOSE 108*  --  22* 106* 105*  BUN 24*  --  40* 41* 41*  CREATININE 0.69  --  1.79* 2.00* 2.07*  CALCIUM 7.8*  --  7.3*  --  5.7*  MG  --  2.2  2.3  --   --   PHOS 2.6  --   --   --   --    Liver Function Tests: Recent Labs  Lab 04/13/18 0345 04/14/18 0331 04/14/18 1517  AST 58* 135* 837*  ALT 39 66* 324*  ALKPHOS 106 84 67  BILITOT 2.5* 3.2* 3.4*  PROT 4.7* 4.0* 3.1*  ALBUMIN 2.4* 2.1* 1.5*   Recent Labs  Lab 04/14/18 1514  LIPASE 81*   Recent Labs  Lab 04/12/18 2202 04/13/18 0345 04/14/18 1513  AMMONIA 56* 23 68*   CBC: Recent Labs  Lab 04/13/18 0345 04/14/18 0331 04/14/18 1104  WBC 22.8* 9.8  --   NEUTROABS  --  8.2*  --   HGB 10.0* 10.3* 7.8*  HCT 31.4* 34.0* 23.0*  MCV 102.3* 107.9*  --   PLT 83* 97*  --    Cardiac Enzymes: No results for input(s): CKTOTAL, CKMB, CKMBINDEX, TROPONINI in the last 168 hours. Sepsis Labs: Recent Labs  Lab 04/13/18 0345 04/14/18 0331 04/14/18 1514 04/14/18 1516  PROCALCITON  --   --  26.35  --   WBC 22.8* 9.8  --   --   LATICACIDVEN  --   --   --  8.3*    Procedures/Operations  Vasopressors and mechanical ventilation.  Fiora Weill 04/19/2018, 9:48 AM

## 2019-05-12 IMAGING — DX DG CHEST 1V PORT
1 series · 1 of 1 positions shown · non-contrast
Comparison: 04/13/2018

CLINICAL DATA: Confusion.

EXAM:
PORTABLE CHEST 1 VIEW

[chest ap]
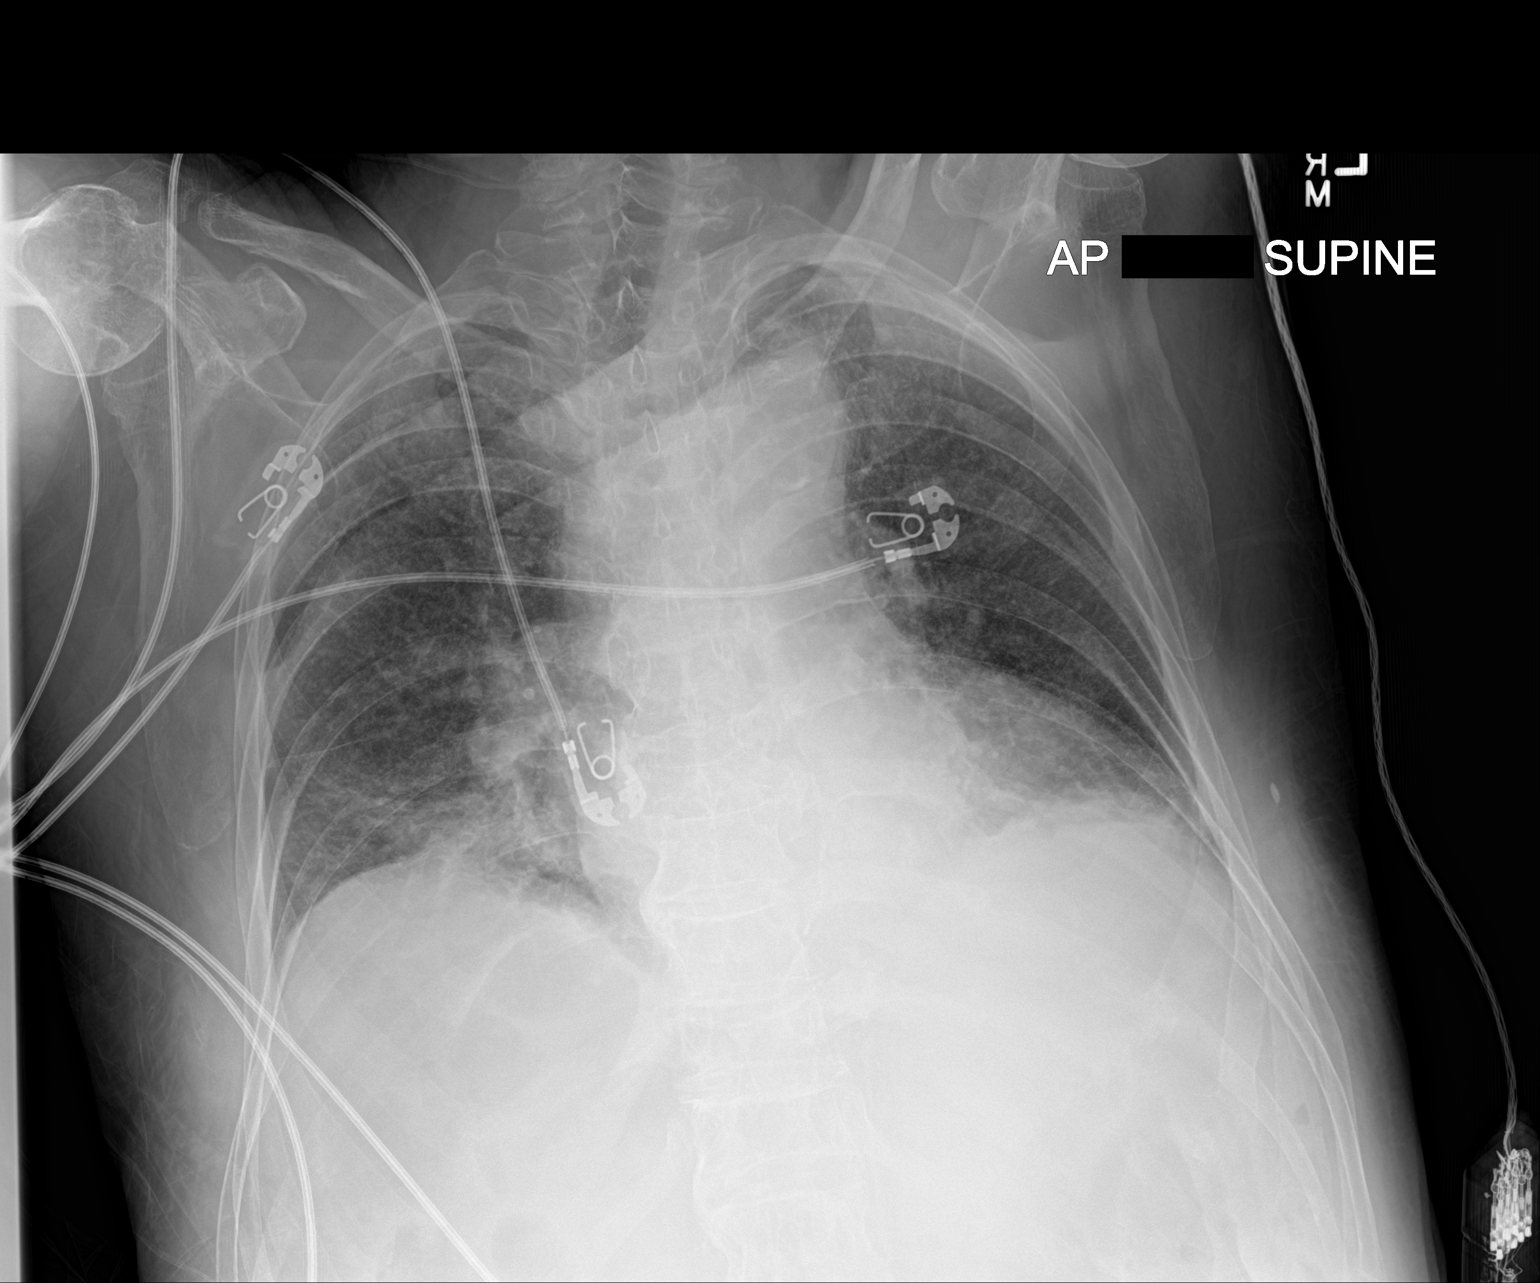

[1 of 1 positions shown; findings below may reference images not displayed]

FINDINGS: The heart size and mediastinal contours are within normal limits.
Lung volumes are lower with bibasilar opacities likely representing
atelectasis. Early pneumonia cannot be excluded. There is no
evidence of pulmonary edema, consolidation, pneumothorax, nodule or
pleural fluid. The visualized skeletal structures are unremarkable.
IMPRESSION: Bibasilar opacities likely representing atelectasis. Early pneumonia
cannot be excluded.
